# Patient Record
Sex: Female | Born: 1965 | Hispanic: No | Smoking: Current every day smoker
Health system: Southern US, Community
[De-identification: ages and names within clinical notes are randomized; demographics above are authoritative.]

## PROBLEM LIST (undated history)

## (undated) DIAGNOSIS — F319 Bipolar disorder, unspecified: Secondary | ICD-10-CM

## (undated) DIAGNOSIS — R061 Stridor: Secondary | ICD-10-CM

## (undated) DIAGNOSIS — H9190 Unspecified hearing loss, unspecified ear: Secondary | ICD-10-CM

## (undated) DIAGNOSIS — F1120 Opioid dependence, uncomplicated: Secondary | ICD-10-CM

## (undated) DIAGNOSIS — R600 Localized edema: Secondary | ICD-10-CM

## (undated) DIAGNOSIS — R7303 Prediabetes: Secondary | ICD-10-CM

## (undated) DIAGNOSIS — E0590 Thyrotoxicosis, unspecified without thyrotoxic crisis or storm: Secondary | ICD-10-CM

## (undated) DIAGNOSIS — R739 Hyperglycemia, unspecified: Secondary | ICD-10-CM

## (undated) DIAGNOSIS — R7989 Other specified abnormal findings of blood chemistry: Secondary | ICD-10-CM

## (undated) DIAGNOSIS — D649 Anemia, unspecified: Secondary | ICD-10-CM

## (undated) DIAGNOSIS — J449 Chronic obstructive pulmonary disease, unspecified: Secondary | ICD-10-CM

## (undated) DIAGNOSIS — F17200 Nicotine dependence, unspecified, uncomplicated: Secondary | ICD-10-CM

## (undated) DIAGNOSIS — F32A Depression, unspecified: Secondary | ICD-10-CM

## (undated) DIAGNOSIS — F329 Major depressive disorder, single episode, unspecified: Secondary | ICD-10-CM

## (undated) DIAGNOSIS — F419 Anxiety disorder, unspecified: Secondary | ICD-10-CM

## (undated) DIAGNOSIS — F4310 Post-traumatic stress disorder, unspecified: Secondary | ICD-10-CM

## (undated) DIAGNOSIS — R1900 Intra-abdominal and pelvic swelling, mass and lump, unspecified site: Secondary | ICD-10-CM

## (undated) HISTORY — DX: Other specified abnormal findings of blood chemistry: R79.89

## (undated) HISTORY — DX: Localized edema: R60.0

## (undated) HISTORY — DX: Anemia, unspecified: D64.9

## (undated) HISTORY — PX: THYROIDECTOMY: SHX17

## (undated) HISTORY — DX: Nicotine dependence, unspecified, uncomplicated: F17.200

## (undated) HISTORY — DX: Intra-abdominal and pelvic swelling, mass and lump, unspecified site: R19.00

## (undated) HISTORY — DX: Hyperglycemia, unspecified: R73.9

## (undated) HISTORY — DX: Opioid dependence, uncomplicated: F11.20

---
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
## 2015-11-03 ENCOUNTER — Encounter (HOSPITAL_COMMUNITY): Payer: Self-pay | Admitting: Emergency Medicine

## 2015-11-03 DIAGNOSIS — Z760 Encounter for issue of repeat prescription: Secondary | ICD-10-CM | POA: Insufficient documentation

## 2015-11-03 DIAGNOSIS — F17200 Nicotine dependence, unspecified, uncomplicated: Secondary | ICD-10-CM | POA: Insufficient documentation

## 2015-11-03 DIAGNOSIS — Z5181 Encounter for therapeutic drug level monitoring: Secondary | ICD-10-CM | POA: Insufficient documentation

## 2015-11-03 LAB — CBC
HEMATOCRIT: 33.2 % — AB (ref 36.0–46.0)
HEMOGLOBIN: 10.6 g/dL — AB (ref 12.0–15.0)
MCH: 25.1 pg — AB (ref 26.0–34.0)
MCHC: 31.9 g/dL (ref 30.0–36.0)
MCV: 78.7 fL (ref 78.0–100.0)
PLATELETS: 273 10*3/uL (ref 150–400)
RBC: 4.22 MIL/uL (ref 3.87–5.11)
RDW: 14.5 % (ref 11.5–15.5)
WBC: 7 10*3/uL (ref 4.0–10.5)

## 2015-11-03 LAB — COMPREHENSIVE METABOLIC PANEL
ALBUMIN: 3.8 g/dL (ref 3.5–5.0)
ALK PHOS: 67 U/L (ref 38–126)
ALT: 24 U/L (ref 14–54)
ANION GAP: 3 — AB (ref 5–15)
AST: 25 U/L (ref 15–41)
BUN: 14 mg/dL (ref 6–20)
CALCIUM: 9.2 mg/dL (ref 8.9–10.3)
CHLORIDE: 110 mmol/L (ref 101–111)
CO2: 24 mmol/L (ref 22–32)
Creatinine, Ser: 0.93 mg/dL (ref 0.44–1.00)
GFR calc Af Amer: >60 mL/min (ref >60)
GFR calc non Af Amer: >60 mL/min (ref >60)
GLUCOSE: 115 mg/dL — AB (ref 65–99)
Potassium: 3.5 mmol/L (ref 3.5–5.1)
SODIUM: 137 mmol/L (ref 135–145)
Total Bilirubin: 0.3 mg/dL (ref 0.3–1.2)
Total Protein: 8.6 g/dL — ABNORMAL HIGH (ref 6.5–8.1)

## 2015-11-03 LAB — ETHANOL: Alcohol, Ethyl (B): <5 mg/dL (ref <5)

## 2015-11-03 LAB — RAPID URINE DRUG SCREEN, HOSP PERFORMED
AMPHETAMINES: NOT DETECTED
BARBITURATES: NOT DETECTED
BENZODIAZEPINES: POSITIVE — AB
Cocaine: NOT DETECTED
Opiates: NOT DETECTED
TETRAHYDROCANNABINOL: NOT DETECTED

## 2015-11-03 NOTE — ED Triage Notes (Signed)
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

## 2015-11-04 ENCOUNTER — Emergency Department (HOSPITAL_COMMUNITY)
Admission: EM | Admit: 2015-11-04 | Discharge: 2015-11-04 | Disposition: A | Discharge status: Home / Self Care | Payer: Self-pay | Attending: Emergency Medicine | Admitting: Emergency Medicine

## 2015-11-04 DIAGNOSIS — Z760 Encounter for issue of repeat prescription: Secondary | ICD-10-CM

## 2015-11-04 HISTORY — DX: Depression, unspecified: F32.A

## 2015-11-04 HISTORY — DX: Unspecified hearing loss, unspecified ear: H91.90

## 2015-11-04 HISTORY — DX: Anxiety disorder, unspecified: F41.9

## 2015-11-04 HISTORY — DX: Major depressive disorder, single episode, unspecified: F32.9

## 2015-11-04 MED ORDER — CHLORDIAZEPOXIDE HCL 25 MG PO CAPS: 25.0000 mg | ORAL_CAPSULE | Freq: Three times a day (TID) | ORAL | 0 refills | Status: DC | PRN

## 2015-11-04 MED ORDER — CHLORDIAZEPOXIDE HCL 5 MG PO CAPS
50.0000 mg | ORAL_CAPSULE | Freq: Once | ORAL | Status: AC
  Administered 2015-11-04: 50 mg via ORAL
  Filled 2015-11-04: qty 10

## 2015-11-04 NOTE — ED Notes (Signed)
Patient verbalized understanding of discharge instructions and denies any further needs or questions at this time. VS stable. Patient ambulatory with steady gait.  

## 2015-11-04 NOTE — ED Notes (Signed)
MD at bedside. 

## 2015-11-04 NOTE — ED Provider Notes (Signed)
[facility] DEPT Provider Note   CSN: [id number] Arrival date & time: 11/03/15  2302  By signing my name below, I, Reola Mosher, attest that this documentation has been prepared under the direction and in the presence of Everlene Balls, MD. Electronically Signed: Reola Mosher, ED Scribe. 11/04/15. 2:27 AM.  History   Chief Complaint Chief Complaint  Patient presents with  . Addiction Problem   The history is provided by the patient. No language interpreter was used.   HPI Comments: Meghan Contreras is a 50 y.o. female with a PMHx of depression and anxiety, who presents to the Emergency Department requesting a medication refill of her rx'd Klonopin. Pt reports that she is currently travelling from [place] to visit her daughter, and ran out of her medications while she was in [place]. Her last intake was ~3 days ago. She states that she takes these medications for her hx of MDD and PTSD. Pt reports that since she has been out of her medications that she has had intermittent episodes of anxiousness, nausea, emesis, diarrhea, and chills.  Pt reports that she will see her doctor soon, and her prescription is scheduled to be refilled in ~6 days. Denies SI, auditory/visual hallucinations, fever, cough, rhinorrhea, or any other associated symptoms.   Past Medical History:  Diagnosis Date  . Anxiety   . Depression   . HOH (hard of hearing)   . Thyroid disease    There are no active problems to display for this patient.  No past surgical history on file.  OB History    No data available     Home Medications    Prior to Admission medications   Not on File   Family History No family history on file.  Social History Social History  Substance Use Topics  . Smoking status: Current Some Day Smoker  . Smokeless tobacco: Never Used  . Alcohol use No   Allergies   Amoxicillin  Review of Systems Review of Systems A complete 10 system review of systems was obtained and all systems  are negative except as noted in the HPI and PMH.   Physical Exam Updated Vital Signs BP 103/72   Pulse 80   Temp 98.5 F (36.9 C) (Oral)   Resp 24   Ht 5\' 1"  (1.549 m)   Wt 189 lb (85.7 kg)   SpO2 98%   BMI 35.71 kg/m   Physical Exam  Constitutional: She is oriented to person, place, and time. She appears well-developed and well-nourished. No distress.  HENT:  Head: Normocephalic and atraumatic.  Nose: Nose normal.  Mouth/Throat: Oropharynx is clear and moist. No oropharyngeal exudate.  Eyes: Conjunctivae and EOM are normal. Pupils are equal, round, and reactive to light. No scleral icterus.  Neck: Normal range of motion. Neck supple. No JVD present. No tracheal deviation present. No thyromegaly present.  Cardiovascular: Normal rate, regular rhythm and normal heart sounds.  Exam reveals no gallop and no friction rub.   No murmur heard. Pulmonary/Chest: Effort normal and breath sounds normal. No respiratory distress. She has no wheezes. She exhibits no tenderness.  Abdominal: Soft. Bowel sounds are normal. She exhibits no distension and no mass. There is no tenderness. There is no rebound and no guarding.  Musculoskeletal: Normal range of motion. She exhibits no edema or tenderness.  Lymphadenopathy:    She has no cervical adenopathy.  Neurological: She is alert and oriented to person, place, and time. No cranial nerve deficit. She exhibits normal muscle tone.  Skin: Skin is warm and dry. No rash noted. No erythema. No pallor.  Nursing note and vitals reviewed.  ED Treatments / Results  DIAGNOSTIC STUDIES: Oxygen Saturation is 99% on RA, normal by my interpretation.  COORDINATION OF CARE: 2:15 AM-Discussed next steps with pt. Pt verbalized understanding and is agreeable with the plan.   Labs (all labs ordered are listed, but only abnormal results are displayed) Labs Reviewed  COMPREHENSIVE METABOLIC PANEL - Abnormal; Notable for the following:       Result Value    Glucose, Bld 115 (*)    Total Protein 8.6 (*)    Anion gap 3 (*)    All other components within normal limits  CBC - Abnormal; Notable for the following:    Hemoglobin 10.6 (*)    HCT 33.2 (*)    MCH 25.1 (*)    All other components within normal limits  URINE RAPID DRUG SCREEN, HOSP PERFORMED - Abnormal; Notable for the following:    Benzodiazepines POSITIVE (*)    All other components within normal limits  ETHANOL   EKG  EKG Interpretation None      Radiology No results found.  Procedures Procedures (including critical care time)  Medications Ordered in ED Medications - No data to display  Initial Impression / Assessment and Plan / ED Course  I have reviewed the triage vital signs and the nursing notes.  Pertinent labs & imaging results that were available during my care of the patient were reviewed by me and considered in my medical decision making (see chart for details).  Clinical Course   Patient presents to the ED for benzo Withdrawals and.  She has run out of her medications and is requesting Klonopin.  Will give librium in the ED and provide Rx for 10 more tabs.  PCP fu advised immediately when she gets home.  She has no current withdrawal symptoms in the ED.  VS are normal.  There are no tongue fasiculations.  She appears well and in NAD.  VS remain within her normal limits and she is safe for DC.  Final Clinical Impressions(s) / ED Diagnoses   Final diagnoses:  None   New Prescriptions New Prescriptions   No medications on file      I personally performed the services described in this documentation, which was scribed in my presence. The recorded information has been reviewed and is accurate.      Everlene Balls, MD 11/04/15 [phone]

## 2017-03-01 DIAGNOSIS — R061 Stridor: Secondary | ICD-10-CM

## 2017-03-01 HISTORY — PX: THYROIDECTOMY: SHX17

## 2017-03-01 HISTORY — DX: Stridor: R06.1

## 2017-10-11 ENCOUNTER — Encounter (HOSPITAL_COMMUNITY): Payer: Self-pay | Admitting: Emergency Medicine

## 2017-10-11 ENCOUNTER — Emergency Department (HOSPITAL_COMMUNITY): Payer: Medicaid Other

## 2017-10-11 ENCOUNTER — Other Ambulatory Visit: Payer: Self-pay

## 2017-10-11 ENCOUNTER — Emergency Department (HOSPITAL_COMMUNITY)
Admission: EM | Admit: 2017-10-11 | Discharge: 2017-10-12 | Disposition: A | Discharge status: Home / Self Care | Payer: Medicaid Other | Attending: Emergency Medicine | Admitting: Emergency Medicine

## 2017-10-11 DIAGNOSIS — E049 Nontoxic goiter, unspecified: Secondary | ICD-10-CM | POA: Insufficient documentation

## 2017-10-11 DIAGNOSIS — M549 Dorsalgia, unspecified: Secondary | ICD-10-CM | POA: Diagnosis present

## 2017-10-11 DIAGNOSIS — R1310 Dysphagia, unspecified: Secondary | ICD-10-CM | POA: Diagnosis not present

## 2017-10-11 DIAGNOSIS — Z79899 Other long term (current) drug therapy: Secondary | ICD-10-CM | POA: Insufficient documentation

## 2017-10-11 DIAGNOSIS — M62838 Other muscle spasm: Secondary | ICD-10-CM

## 2017-10-11 DIAGNOSIS — F17200 Nicotine dependence, unspecified, uncomplicated: Secondary | ICD-10-CM | POA: Diagnosis not present

## 2017-10-11 HISTORY — DX: Thyrotoxicosis, unspecified without thyrotoxic crisis or storm: E05.90

## 2017-10-11 HISTORY — DX: Post-traumatic stress disorder, unspecified: F43.10

## 2017-10-11 LAB — I-STAT BETA HCG BLOOD, ED (MC, WL, AP ONLY): I-stat hCG, quantitative: <5 m[IU]/mL (ref <5)

## 2017-10-11 LAB — CBC
HEMATOCRIT: 32.7 % — AB (ref 36.0–46.0)
Hemoglobin: 10.6 g/dL — ABNORMAL LOW (ref 12.0–15.0)
MCH: 26.5 pg (ref 26.0–34.0)
MCHC: 32.4 g/dL (ref 30.0–36.0)
MCV: 81.8 fL (ref 78.0–100.0)
PLATELETS: 314 10*3/uL (ref 150–400)
RBC: 4 MIL/uL (ref 3.87–5.11)
RDW: 13.8 % (ref 11.5–15.5)
WBC: 7.8 10*3/uL (ref 4.0–10.5)

## 2017-10-11 LAB — I-STAT TROPONIN, ED: Troponin i, poc: 0 ng/mL (ref 0.00–0.08)

## 2017-10-11 NOTE — ED Triage Notes (Signed)
Pt states she has been "sick" X1 week. Pt reports nasal congestion, sinus pressure, sore throat, and R neck swelling. Pt states hx of hyperthyroidism and she typical has swelling to R side of her neck r/t goiter but swelling has worsened over the past few days. Pt also states she has been off her hormone pills X3 weeks, also says she is a recovering addict and takes suboxone but actually took Morphine today for her pain.

## 2017-10-12 LAB — I-STAT CHEM 8, ED
BUN: 18 mg/dL (ref 6–20)
CALCIUM ION: 1.15 mmol/L (ref 1.15–1.40)
CHLORIDE: 108 mmol/L (ref 98–111)
CREATININE: 0.9 mg/dL (ref 0.44–1.00)
GLUCOSE: 110 mg/dL — AB (ref 70–99)
HCT: 32 % — ABNORMAL LOW (ref 36.0–46.0)
Hemoglobin: 10.9 g/dL — ABNORMAL LOW (ref 12.0–15.0)
POTASSIUM: 3.5 mmol/L (ref 3.5–5.1)
Sodium: 142 mmol/L (ref 135–145)
TCO2: 24 mmol/L (ref 22–32)

## 2017-10-12 LAB — I-STAT TROPONIN, ED: Troponin i, poc: 0 ng/mL (ref 0.00–0.08)

## 2017-10-12 NOTE — ED Provider Notes (Signed)
[facility] EMERGENCY DEPARTMENT Provider Note  CSN: [id number] Arrival date & time: 10/11/17 2039  Chief Complaint(s) Multiple Complaints  HPI Meghan Contreras is a 52 y.o. female who presents to the emergency department with multiple complaints including right mid back and neck pain, goiter and odynophagia.  Patient reports that she is in the process of moving down to [place] from [place].  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  Reported that her back pain and neck pain began several days ago.  Pain is aching in nature.  Exacerbated with movement and palpation.  No alleviating factors.  She has tried taking Motrin without relief.  Patient also reports being a recovering addict on Suboxone however reports that she took morphine today which was not hers causing her to feel even worse.  She is also endorsing several days of odynophagia related to her goiter.  She reports that she stopped taking her "hormone" medication 3 weeks ago.  States that the dentofacial goes from her throat down into the upper chest.  This is only felt with eating/drinking hot foods and liquids.  She denies any associated shortness of breath.  No substernal precordial chest pain.  No fevers or chills.  No nausea or vomiting.  No abdominal pain.  HPI  Past Medical History Past Medical History:  Diagnosis Date  . Anxiety   . Depression   . HOH (hard of hearing)   . Hyperthyroidism   . PTSD (post-traumatic stress disorder)    There are no active problems to display for this patient.  Home Medication(s) Prior to Admission medications   Medication Sig Start Date End Date Taking? Authorizing Provider  buprenorphine-naloxone (SUBOXONE) 8-2 mg SUBL SL tablet Place 1 tablet under the tongue 2 (two) times daily.   Yes [provider]  estradiol (ESTRACE) 2 MG tablet Take 2 mg by mouth daily.   Yes [provider]  ibuprofen (ADVIL,MOTRIN) 600 MG tablet Take 600  mg by mouth once.   Yes [provider]  morphine (MS CONTIN) 30 MG 12 hr tablet Take 30 mg by mouth once.   Yes [provider]  prazosin (MINIPRESS) 2 MG capsule Take 4 mg by mouth at bedtime.   Yes [provider]  QUEtiapine (SEROQUEL) 100 MG tablet Take 100 mg by mouth daily.   Yes [provider]  QUEtiapine (SEROQUEL) 300 MG tablet Take 300 mg by mouth at bedtime.   Yes [provider]  chlordiazePOXIDE (LIBRIUM) 25 MG capsule Take 1 capsule (25 mg total) by mouth 3 (three) times daily as needed for withdrawal. 11/04/15   Everlene Balls, MD                                                                                                                                    Past Surgical History History reviewed. No pertinent surgical history. Family History No family  history on file.  Social History Social History   Tobacco Use  . Smoking status: Current Some Day Smoker  . Smokeless tobacco: Never Used  Substance Use Topics  . Alcohol use: No  . Drug use: Yes    Comment: Benzodiazepines / Klonopin addiction    Allergies Amoxicillin  Review of Systems Review of Systems All other systems are reviewed and are negative for acute change except as noted in the HPI  Physical Exam Vital Signs  I have reviewed the triage vital signs BP 110/75 (BP Location: Left Arm)   Pulse 61   Temp 98.6 F (37 C) (Oral)   Resp 16   Ht 5\' 3"  (1.6 m)   Wt 68 kg   SpO2 97%   BMI 26.57 kg/m    Physical Exam  Constitutional: She is oriented to person, place, and time. She appears well-developed and well-nourished. No distress.  HENT:  Head: Normocephalic and atraumatic.  Right Ear: Tympanic membrane normal.  Left Ear: Tympanic membrane normal.  Nose: Nose normal.  Mouth/Throat: No oropharyngeal exudate, posterior oropharyngeal edema or posterior oropharyngeal erythema. No tonsillar exudate.  Eyes: Pupils are equal, round, and reactive to light.  Conjunctivae and EOM are normal. Right eye exhibits no discharge. Left eye exhibits no discharge. No scleral icterus.  Neck: Normal range of motion and phonation normal. Neck supple. Muscular tenderness present. Thyromegaly (bilaterally, R>L) present.    Cardiovascular: Normal rate and regular rhythm. Exam reveals no gallop and no friction rub.  No murmur heard. Pulmonary/Chest: Effort normal and breath sounds normal. No stridor. No respiratory distress. She has no rales.  Abdominal: Soft. She exhibits no distension. There is no tenderness.  Musculoskeletal: She exhibits no edema.       Cervical back: She exhibits tenderness and spasm. She exhibits no bony tenderness.       Back:  Neurological: She is alert and oriented to person, place, and time.  Skin: Skin is warm and dry. No rash noted. She is not diaphoretic. No erythema.  Psychiatric: She has a normal mood and affect.  Vitals reviewed.   ED Results and Treatments Labs (all labs ordered are listed, but only abnormal results are displayed) Labs Reviewed  CBC - Abnormal; Notable for the following components:      Result Value   Hemoglobin 10.6 (*)    HCT 32.7 (*)    All other components within normal limits  I-STAT CHEM 8, ED - Abnormal; Notable for the following components:   Glucose, Bld 110 (*)    Hemoglobin 10.9 (*)    HCT 32.0 (*)    All other components within normal limits  I-STAT TROPONIN, ED  I-STAT BETA HCG BLOOD, ED ([facility], [facility], [facility] ONLY)  I-STAT TROPONIN, ED                                                                                                                         EKG  EKG Interpretation  Date/Time:  Tuesday  October 11 2017 20:47:53 EDT Ventricular Rate:  73 PR Interval:  164 QRS Duration: 92 QT Interval:  348 QTC Calculation: 383 R Axis:   61 Text Interpretation:  Normal sinus rhythm Nonspecific T wave abnormality Abnormal ECG NO STEMI No old tracing to compare Confirmed by Addison Lank [phone]) on  10/12/2017 1:27:09 AM Also confirmed by Addison Lank [phone]), editor Philomena Doheny [phone])  on 10/12/2017 8:22:30 AM      Radiology Dg Chest 2 View  Result Date: 10/11/2017 CLINICAL DATA:  Shortness of breath EXAM: CHEST - 2 VIEW COMPARISON:  None. FINDINGS: There is no edema or consolidation. The heart size and pulmonary vascularity are normal. No adenopathy. There is degenerative change in the thoracic spine. IMPRESSION: No edema or consolidation. Electronically Signed   By: Lowella Grip III M.D.   On: 10/11/2017 21:46   Pertinent labs & imaging results that were available during my care of the patient were reviewed by me and considered in my medical decision making (see chart for details).  Medications Ordered in ED Medications - No data to display                                                                                                                                  Procedures Procedures  (including critical care time)  Medical Decision Making / ED Course I have reviewed the nursing notes for this encounter and the patient's prior records (if available in EHR or on provided paperwork).    1. Back/neck pain Patient with spastic musculature.  This appears to be the source of her back and neck pain.  2. Goiter with odynophagia Due to her reported chest pain, triage obtain cardiac work-up which revealed nonischemic EKG.  Troponin negative x2. Chest x-ray without evidence suggestive of pneumonia, pneumothorax, pneumomediastinum.  No abnormal contour of the mediastinum to suggest dissection. No evidence of acute injuries. Her chest pain appears to be GI related. There is no respiratory distress and patient is able to tolerate oral hydration.  She reports that she has PCP follow-up within 1 week.  The patient appears reasonably screened and/or stabilized for discharge and I doubt any other medical condition or other [facility] requiring further screening, evaluation, or treatment  in the ED at this time prior to discharge.  The patient is safe for discharge with strict return precautions.  Final Clinical Impression(s) / ED Diagnoses Final diagnoses:  Trapezius muscle spasm  Goiter  Odynophagia    Disposition: Discharge  Condition: Good  I have discussed the results, Dx and Tx plan with the patient who expressed understanding and agree(s) with the plan. Discharge instructions discussed at great length. The patient was given strict return precautions who verbalized understanding of the instructions. No further questions at time of discharge.    ED Discharge Orders    None       Follow Up: Primary care provider   If you do not  have a primary care physician, contact [facility] at [phone] for referral     This chart was dictated using voice recognition software.  Despite best efforts to proofread,  errors can occur which can change the documentation meaning.   Fatima Blank, MD 10/12/17 [phone]

## 2017-10-12 NOTE — Discharge Instructions (Addendum)
You may use over-the-counter Motrin (Ibuprofen), Acetaminophen (Tylenol), topical muscle creams such as SalonPas, Icy Hot, Bengay, etc. Please stretch, apply heat, and have massage therapy for additional assistance. ° °

## 2018-08-04 ENCOUNTER — Emergency Department (HOSPITAL_COMMUNITY): Payer: 59

## 2018-08-04 ENCOUNTER — Other Ambulatory Visit: Payer: Self-pay

## 2018-08-04 ENCOUNTER — Inpatient Hospital Stay (HOSPITAL_COMMUNITY)
Admission: EM | Admit: 2018-08-04 | Discharge: 2018-08-05 | DRG: 189 | Discharge status: Against Medical Advice | Payer: 59 | Attending: Internal Medicine | Admitting: Internal Medicine

## 2018-08-04 ENCOUNTER — Encounter (HOSPITAL_COMMUNITY): Payer: Self-pay | Admitting: Emergency Medicine

## 2018-08-04 DIAGNOSIS — E0590 Thyrotoxicosis, unspecified without thyrotoxic crisis or storm: Secondary | ICD-10-CM | POA: Diagnosis present

## 2018-08-04 DIAGNOSIS — G8929 Other chronic pain: Secondary | ICD-10-CM | POA: Diagnosis present

## 2018-08-04 DIAGNOSIS — R0602 Shortness of breath: Secondary | ICD-10-CM

## 2018-08-04 DIAGNOSIS — Z5329 Procedure and treatment not carried out because of patient's decision for other reasons: Secondary | ICD-10-CM | POA: Diagnosis not present

## 2018-08-04 DIAGNOSIS — Z20828 Contact with and (suspected) exposure to other viral communicable diseases: Secondary | ICD-10-CM | POA: Diagnosis present

## 2018-08-04 DIAGNOSIS — F4310 Post-traumatic stress disorder, unspecified: Secondary | ICD-10-CM | POA: Diagnosis present

## 2018-08-04 DIAGNOSIS — J9621 Acute and chronic respiratory failure with hypoxia: Principal | ICD-10-CM | POA: Diagnosis present

## 2018-08-04 DIAGNOSIS — E890 Postprocedural hypothyroidism: Secondary | ICD-10-CM | POA: Diagnosis present

## 2018-08-04 DIAGNOSIS — J9622 Acute and chronic respiratory failure with hypercapnia: Secondary | ICD-10-CM | POA: Diagnosis present

## 2018-08-04 DIAGNOSIS — J9811 Atelectasis: Secondary | ICD-10-CM | POA: Diagnosis present

## 2018-08-04 DIAGNOSIS — J441 Chronic obstructive pulmonary disease with (acute) exacerbation: Secondary | ICD-10-CM | POA: Diagnosis present

## 2018-08-04 DIAGNOSIS — F418 Other specified anxiety disorders: Secondary | ICD-10-CM | POA: Diagnosis present

## 2018-08-04 DIAGNOSIS — G9341 Metabolic encephalopathy: Secondary | ICD-10-CM | POA: Diagnosis present

## 2018-08-04 DIAGNOSIS — F17210 Nicotine dependence, cigarettes, uncomplicated: Secondary | ICD-10-CM | POA: Diagnosis present

## 2018-08-04 LAB — CBC WITH DIFFERENTIAL/PLATELET
Abs Immature Granulocytes: 0.01 10*3/uL (ref 0.00–0.07)
Basophils Absolute: 0.1 10*3/uL (ref 0.0–0.1)
Basophils Relative: 1 %
Eosinophils Absolute: 0 10*3/uL (ref 0.0–0.5)
Eosinophils Relative: 1 %
HCT: 34 % — ABNORMAL LOW (ref 36.0–46.0)
Hemoglobin: 10.9 g/dL — ABNORMAL LOW (ref 12.0–15.0)
Immature Granulocytes: 0 %
Lymphocytes Relative: 47 %
Lymphs Abs: 2.3 10*3/uL (ref 0.7–4.0)
MCH: 28.3 pg (ref 26.0–34.0)
MCHC: 32.1 g/dL (ref 30.0–36.0)
MCV: 88.3 fL (ref 80.0–100.0)
Monocytes Absolute: 0.2 10*3/uL (ref 0.1–1.0)
Monocytes Relative: 5 %
Neutro Abs: 2.3 10*3/uL (ref 1.7–7.7)
Neutrophils Relative %: 46 %
Platelets: 293 10*3/uL (ref 150–400)
RBC: 3.85 MIL/uL — ABNORMAL LOW (ref 3.87–5.11)
RDW: 17.2 % — ABNORMAL HIGH (ref 11.5–15.5)
WBC: 4.9 10*3/uL (ref 4.0–10.5)
nRBC: 0 % (ref 0.0–0.2)

## 2018-08-04 LAB — POCT I-STAT EG7
Acid-Base Excess: 8 mmol/L — ABNORMAL HIGH (ref 0.0–2.0)
Bicarbonate: 35.1 mmol/L — ABNORMAL HIGH (ref 20.0–28.0)
Calcium, Ion: 1.14 mmol/L — ABNORMAL LOW (ref 1.15–1.40)
HCT: 33 % — ABNORMAL LOW (ref 36.0–46.0)
Hemoglobin: 11.2 g/dL — ABNORMAL LOW (ref 12.0–15.0)
O2 Saturation: 80 %
Potassium: 3.7 mmol/L (ref 3.5–5.1)
Sodium: 143 mmol/L (ref 135–145)
TCO2: 37 mmol/L — ABNORMAL HIGH (ref 22–32)
pCO2, Ven: 61 mmHg — ABNORMAL HIGH (ref 44.0–60.0)
pH, Ven: 7.368 (ref 7.250–7.430)
pO2, Ven: 48 mmHg — ABNORMAL HIGH (ref 32.0–45.0)

## 2018-08-04 LAB — POCT I-STAT 7, (LYTES, BLD GAS, ICA,H+H)
Acid-Base Excess: 5 mmol/L — ABNORMAL HIGH (ref 0.0–2.0)
Bicarbonate: 34.5 mmol/L — ABNORMAL HIGH (ref 20.0–28.0)
Calcium, Ion: 1.29 mmol/L (ref 1.15–1.40)
HCT: 32 % — ABNORMAL LOW (ref 36.0–46.0)
Hemoglobin: 10.9 g/dL — ABNORMAL LOW (ref 12.0–15.0)
O2 Saturation: 93 %
Patient temperature: 98.4
Potassium: 3.8 mmol/L (ref 3.5–5.1)
Sodium: 140 mmol/L (ref 135–145)
TCO2: 37 mmol/L — ABNORMAL HIGH (ref 22–32)
pCO2 arterial: 76.3 mmHg (ref 32.0–48.0)
pH, Arterial: 7.263 — ABNORMAL LOW (ref 7.350–7.450)
pO2, Arterial: 78 mmHg — ABNORMAL LOW (ref 83.0–108.0)

## 2018-08-04 LAB — BASIC METABOLIC PANEL
Anion gap: 4 — ABNORMAL LOW (ref 5–15)
BUN: 20 mg/dL (ref 6–20)
CO2: 36 mmol/L — ABNORMAL HIGH (ref 22–32)
Calcium: 9.4 mg/dL (ref 8.9–10.3)
Chloride: 101 mmol/L (ref 98–111)
Creatinine, Ser: 1.39 mg/dL — ABNORMAL HIGH (ref 0.44–1.00)
GFR calc Af Amer: 51 mL/min — ABNORMAL LOW (ref >60)
GFR calc non Af Amer: 44 mL/min — ABNORMAL LOW (ref >60)
Glucose, Bld: 95 mg/dL (ref 70–99)
Potassium: 3.7 mmol/L (ref 3.5–5.1)
Sodium: 141 mmol/L (ref 135–145)

## 2018-08-04 LAB — I-STAT BETA HCG BLOOD, ED (MC, WL, AP ONLY): I-stat hCG, quantitative: <5 m[IU]/mL (ref <5)

## 2018-08-04 LAB — TROPONIN I: Troponin I: <0.03 ng/mL (ref <0.03)

## 2018-08-04 LAB — RAPID URINE DRUG SCREEN, HOSP PERFORMED
Amphetamines: NOT DETECTED
Barbiturates: NOT DETECTED
Benzodiazepines: POSITIVE — AB
Cocaine: NOT DETECTED
Opiates: NOT DETECTED
Tetrahydrocannabinol: NOT DETECTED

## 2018-08-04 LAB — MAGNESIUM: Magnesium: 2.1 mg/dL (ref 1.7–2.4)

## 2018-08-04 LAB — BRAIN NATRIURETIC PEPTIDE: B Natriuretic Peptide: 15.4 pg/mL (ref 0.0–100.0)

## 2018-08-04 LAB — SARS CORONAVIRUS 2 BY RT PCR (HOSPITAL ORDER, PERFORMED IN ~~LOC~~ HOSPITAL LAB): SARS Coronavirus 2: NEGATIVE

## 2018-08-04 MED ORDER — ALBUTEROL SULFATE (2.5 MG/3ML) 0.083% IN NEBU
2.5000 mg | INHALATION_SOLUTION | Freq: Once | RESPIRATORY_TRACT | Status: AC
  Administered 2018-08-04: 21:00:00 2.5 mg via RESPIRATORY_TRACT
  Filled 2018-08-04: qty 3

## 2018-08-04 MED ORDER — IOHEXOL 300 MG/ML  SOLN
75.0000 mL | Freq: Once | INTRAMUSCULAR | Status: AC | PRN
  Administered 2018-08-04: 21:00:00 75 mL via INTRAVENOUS

## 2018-08-04 MED ORDER — ALBUTEROL SULFATE HFA 108 (90 BASE) MCG/ACT IN AERS
2.0000 | INHALATION_SPRAY | Freq: Once | RESPIRATORY_TRACT | Status: AC
  Administered 2018-08-04: 2 via RESPIRATORY_TRACT
  Filled 2018-08-04: qty 6.7

## 2018-08-04 MED ORDER — ALBUTEROL SULFATE HFA 108 (90 BASE) MCG/ACT IN AERS
4.0000 | INHALATION_SPRAY | Freq: Once | RESPIRATORY_TRACT | Status: DC
  Filled 2018-08-04: qty 6.7

## 2018-08-04 MED ORDER — METHYLPREDNISOLONE SODIUM SUCC 125 MG IJ SOLR
125.0000 mg | Freq: Once | INTRAMUSCULAR | Status: AC
  Administered 2018-08-04: 18:00:00 125 mg via INTRAVENOUS
  Filled 2018-08-04: qty 2

## 2018-08-04 NOTE — ED Notes (Signed)
Resident MD at pt bedside.

## 2018-08-04 NOTE — ED Notes (Signed)
Pt repositioned to upright position.  Sats increased to 96 % and O2 was decreased from 8L to 4L [place].   Attempted to decrease O2 on [place] further to 2L, but sats dropped to 86%.

## 2018-08-04 NOTE — H&P (Signed)
History and Physical   Meghan Contreras GDJ:[id number] DOB: [birth year] DOA: 08/04/2018  Referring MD/NP/PA: Dr. Lajean Saver  PCP: Patient, No Pcp Per   Outpatient Specialists: None  Patient coming from: Home  Chief Complaint: Altered mental status  HPI: Meghan Contreras is a 53 y.o. female with medical history significant of posttraumatic stress disorder, hyperthyroidism status post thyroidectomy, anxiety disorder with depression who was brought in to the ER with acute respiratory failure altered mental status currently on BiPAP.  She has no prior documented history of COPD but patient notably having significant hypercarbia and hypoxemia consistent with COPD exacerbation.  Patient currently on BiPAP and not responding.  PCO2 is 70.  She is arousable but does not stay awake too long.  She is not moving much air bilaterally and presumed to have COPD exacerbation.  Patient is being admitted for acute on chronic respiratory failure secondary to hypercarbia..  ED Course: Temperature is 98.4 with blood pressure 96/76 pulse 83 respiratory 25 oxygen sat 83% on room air currently 100% on BiPAP.  White count 4.9 hemoglobin 10.9 and platelet count of 293.  CO2 of 36 creatinine 1.38.  Calcium is 9.4.  Urine drug screen is positive for the benzos.  ABG showed a pH of 7.263 PCO2 76.3 and PO2 of 78.  CT neck soft tissue showed no neck masses.  Chest x-ray showed bibasilar atelectasis.  COVID-19 testing is negative.  Patient is being admitted with acute respiratory failure secondary to COPD exacerbation  Review of Systems: As per HPI otherwise 10 point review of systems negative.    Past Medical History:  Diagnosis Date  . Anxiety   . Depression   . HOH (hard of hearing)   . Hyperthyroidism   . PTSD (post-traumatic stress disorder)     Past Surgical History:  Procedure Laterality Date  . THYROIDECTOMY  2019     reports that she has been smoking. She has never used smokeless tobacco. She reports current  drug use. She reports that she does not drink alcohol.  Allergies  Allergen Reactions  . Amoxicillin Swelling    No family history on file.   Prior to Admission medications   Medication Sig Start Date End Date Taking? Authorizing Provider  buprenorphine-naloxone (SUBOXONE) 8-2 mg SUBL SL tablet Place 1 tablet under the tongue 2 (two) times daily.    [provider]  chlordiazePOXIDE (LIBRIUM) 25 MG capsule Take 1 capsule (25 mg total) by mouth 3 (three) times daily as needed for withdrawal. 11/04/15   Everlene Balls, MD  estradiol (ESTRACE) 2 MG tablet Take 2 mg by mouth daily.    [provider]  ibuprofen (ADVIL,MOTRIN) 600 MG tablet Take 600 mg by mouth once.    [provider]  morphine (MS CONTIN) 30 MG 12 hr tablet Take 30 mg by mouth once.    [provider]  prazosin (MINIPRESS) 2 MG capsule Take 4 mg by mouth at bedtime.    [provider]  QUEtiapine (SEROQUEL) 100 MG tablet Take 100 mg by mouth daily.    [provider]  QUEtiapine (SEROQUEL) 300 MG tablet Take 300 mg by mouth at bedtime.    [provider]    Physical Exam: Vitals:   08/04/18 1915 08/04/18 1945 08/04/18 2110 08/04/18 2111  BP: 129/87 123/76    Pulse: 65 76  71  Resp: 13 (!) 25  16  Temp:      TempSrc:      SpO2: 95% (!) 83% 95%  95%  Weight:      Height:          Constitutional: Sleepy not communicating Vitals:   08/04/18 1915 08/04/18 1945 08/04/18 2110 08/04/18 2111  BP: 129/87 123/76    Pulse: 65 76  71  Resp: 13 (!) 25  16  Temp:      TempSrc:      SpO2: 95% (!) 83% 95% 95%  Weight:      Height:       Eyes: PERRL, lids and conjunctivae normal ENMT: Mucous membranes are moist. Posterior pharynx clear of any exudate or lesions.Normal dentition.  Neck: normal, supple, no masses, no thyromegaly Respiratory: Decreased air entry bilaterally with marked expiratory wheezing, no significant crackles, increase respiratory drive with  use of accessory muscles  Cardiovascular: Regular rate and rhythm, no murmurs / rubs / gallops. No extremity edema. 2+ pedal pulses. No carotid bruits.  Abdomen: no tenderness, no masses palpated. No hepatosplenomegaly. Bowel sounds positive.  Musculoskeletal: no clubbing / cyanosis. No joint deformity upper and lower extremities. Good ROM, no contractures. Normal muscle tone.  Skin: no rashes, lesions, ulcers. No induration Neurologic: CN 2-12 grossly intact. Sensation intact, DTR normal. Strength 5/5 in all 4.  Psychiatric: Obtunded, responsive to voice but sleepy    Labs on Admission: I have personally reviewed following labs and imaging studies  CBC: Recent Labs  Lab 08/04/18 1711 08/04/18 1721 08/04/18 2020  WBC 4.9  --   --   NEUTROABS 2.3  --   --   HGB 10.9* 11.2* 10.9*  HCT 34.0* 33.0* 32.0*  MCV 88.3  --   --   PLT 293  --   --    Basic Metabolic Panel: Recent Labs  Lab 08/04/18 1711 08/04/18 1721 08/04/18 2020  NA 141 143 140  K 3.7 3.7 3.8  CL 101  --   --   CO2 36*  --   --   GLUCOSE 95  --   --   BUN 20  --   --   CREATININE 1.39*  --   --   CALCIUM 9.4  --   --   MG 2.1  --   --    GFR: Estimated Creatinine Clearance: 45.8 mL/min (A) (by C-G formula based on SCr of 1.39 mg/dL (H)). Liver Function Tests: No results for input(s): AST, ALT, ALKPHOS, BILITOT, PROT, ALBUMIN in the last 168 hours. No results for input(s): LIPASE, AMYLASE in the last 168 hours. No results for input(s): AMMONIA in the last 168 hours. Coagulation Profile: No results for input(s): INR, PROTIME in the last 168 hours. Cardiac Enzymes: Recent Labs  Lab 08/04/18 1711  TROPONINI <0.03   BNP (last 3 results) No results for input(s): PROBNP in the last 8760 hours. HbA1C: No results for input(s): HGBA1C in the last 72 hours. CBG: No results for input(s): GLUCAP in the last 168 hours. Lipid Profile: No results for input(s): CHOL, HDL, LDLCALC, TRIG, CHOLHDL, LDLDIRECT in the  last 72 hours. Thyroid Function Tests: No results for input(s): TSH, T4TOTAL, FREET4, T3FREE, THYROIDAB in the last 72 hours. Anemia Panel: No results for input(s): VITAMINB12, FOLATE, FERRITIN, TIBC, IRON, RETICCTPCT in the last 72 hours. Urine analysis: No results found for: COLORURINE, APPEARANCEUR, LABSPEC, PHURINE, GLUCOSEU, HGBUR, BILIRUBINUR, KETONESUR, PROTEINUR, UROBILINOGEN, NITRITE, LEUKOCYTESUR Sepsis Labs: @LABRCNTIP (procalcitonin:4,lacticidven:4) ) Recent Results (from the past 240 hour(s))  SARS Coronavirus 2 (CEPHEID - Performed in [place] hospital lab), Hosp Order     Status: None  Collection Time: 08/04/18  6:08 PM  Result Value Ref Range Status   SARS Coronavirus 2 NEGATIVE NEGATIVE Final    Comment: (NOTE) If result is NEGATIVE SARS-CoV-2 target nucleic acids are NOT DETECTED. The SARS-CoV-2 RNA is generally detectable in upper and lower  respiratory specimens during the acute phase of infection. The lowest  concentration of SARS-CoV-2 viral copies this assay can detect is 250  copies / mL. A negative result does not preclude SARS-CoV-2 infection  and should not be used as the sole basis for treatment or other  patient management decisions.  A negative result may occur with  improper specimen collection / handling, submission of specimen other  than nasopharyngeal swab, presence of viral mutation(s) within the  areas targeted by this assay, and inadequate number of viral copies  (<250 copies / mL). A negative result must be combined with clinical  observations, patient history, and epidemiological information. If result is POSITIVE SARS-CoV-2 target nucleic acids are DETECTED. The SARS-CoV-2 RNA is generally detectable in upper and lower  respiratory specimens dur ing the acute phase of infection.  Positive  results are indicative of active infection with SARS-CoV-2.  Clinical  correlation with patient history and other diagnostic information is  necessary  to determine patient infection status.  Positive results do  not rule out bacterial infection or co-infection with other viruses. If result is PRESUMPTIVE POSTIVE SARS-CoV-2 nucleic acids MAY BE PRESENT.   A presumptive positive result was obtained on the submitted specimen  and confirmed on repeat testing.  While 2019 novel coronavirus  (SARS-CoV-2) nucleic acids may be present in the submitted sample  additional confirmatory testing may be necessary for epidemiological  and / or clinical management purposes  to differentiate between  SARS-CoV-2 and other Sarbecovirus currently known to infect humans.  If clinically indicated additional testing with an alternate test  methodology [phone]) is advised. The SARS-CoV-2 RNA is generally  detectable in upper and lower respiratory sp ecimens during the acute  phase of infection. The expected result is Negative. Fact Sheet for Patients:  StrictlyIdeas.no Fact Sheet for Healthcare Providers: BankingDealers.co.za This test is not yet approved or cleared by the [place] FDA and has been authorized for detection and/or diagnosis of SARS-CoV-2 by FDA under an Emergency Use Authorization (EUA).  This EUA will remain in effect (meaning this test can be used) for the duration of the COVID-19 declaration under Section 564(b)(1) of the Act, 21 U.S.C. section 360bbb-3(b)(1), unless the authorization is terminated or revoked sooner. Performed at [facility], [place] [address] [place], [place] [address]      Radiological Exams on Admission: Ct Soft Tissue Neck W Contrast  Result Date: 08/04/2018 CLINICAL DATA:  Shortness of breath for 6 months, worsening recently. Thyroid removal. EXAM: CT NECK WITH CONTRAST TECHNIQUE: Multidetector CT imaging of the neck was performed using the standard protocol following the bolus administration of intravenous contrast. CONTRAST:  49mL OMNIPAQUE IOHEXOL 300  MG/ML  SOLN COMPARISON:  None. FINDINGS: The patient was breathing and swallowing throughout the exam. Image quality is reduced. The study is suboptimal probably diagnostic. Pharynx and larynx: Normal. No mass or swelling. No subglottic narrowing. Salivary glands: No inflammation, mass, or stone. Thyroid: Surgically absent. No hyperattenuating thyroid tissue is seen. Lymph nodes: None enlarged or abnormal density. Vascular: Patent Limited intracranial: Negative. Visualized orbits: Negative. Mastoids and visualized paranasal sinuses: Negative. Skeleton: Spondylosis.  Poor dentition. Upper chest: No visible mass or pneumothorax. Other: None. IMPRESSION: Poor quality examination  due to patient breathing and swallowing demonstrating no pharyngeal or laryngeal mass. No subglottic narrowing. No apparent complications of thyroid surgery. Electronically Signed   By: Staci Righter M.D.   On: 08/04/2018 21:09   Dg Chest Portable 1 View  Result Date: 08/04/2018 CLINICAL DATA:  Chronic shortness of breath for the past 6 months. EXAM: PORTABLE CHEST 1 VIEW COMPARISON:  Chest x-ray dated October 11, 2017. FINDINGS: The heart size and mediastinal contours are within normal limits. Normal pulmonary vascularity. Low lung volumes with bibasilar atelectasis. No pleural effusion or pneumothorax. No acute osseous abnormality. IMPRESSION: Low lung volumes with bibasilar atelectasis. Electronically Signed   By: Titus Dubin M.D.   On: 08/04/2018 18:08    EKG: Independently reviewed.  Shows normal sinus rhythm with a rate of 86, nonspecific ST changes  Assessment/Plan Principal Problem:   Acute on chronic respiratory failure with hypoxemia (HCC) Active Problems:   COPD with acute exacerbation (HCC)   Hyperthyroidism   Acute metabolic encephalopathy     #1 acute on chronic respiratory failure with hypercarbia: Patient currently on BiPAP.  Admit to progressive care.  Continue BiPAP with steroids antibiotics and nebulizer  treatment.  #2 COPD exacerbation: Patient will be maintained on steroids with nebulizer and empiric antibiotics.  Supportive care.  #3 metabolic encephalopathy: Secondary to hypercarbia.  Most likely patient's mental status will improve with treatment.  #4 anxiety depression and PTSD: Resume home regimen as soon as practicable.   DVT prophylaxis: Lovenox Code Status: Full code Family Communication: No family currently at bedside Disposition Plan: To be determined Consults called: None Admission status: Inpatient  Severity of Illness: The appropriate patient status for this patient is INPATIENT. Inpatient status is judged to be reasonable and necessary in order to provide the required intensity of service to ensure the patient's safety. The patient's presenting symptoms, physical exam findings, and initial radiographic and laboratory data in the context of their chronic comorbidities is felt to place them at high risk for further clinical deterioration. Furthermore, it is not anticipated that the patient will be medically stable for discharge from the hospital within 2 midnights of admission. The following factors support the patient status of inpatient.   " The patient's presenting symptoms include shortness of breath and altered mental status. " The worrisome physical exam findings include altered mental status. " The initial radiographic and laboratory data are worrisome because of ABG showed hypercarbia with respiratory failure. " The chronic co-morbidities include posttraumatic stress disorder.   * I certify that at the point of admission it is my clinical judgment that the patient will require inpatient hospital care spanning beyond 2 midnights from the point of admission due to high intensity of service, high risk for further deterioration and high frequency of surveillance required.Barbette Merino MD [facility] Pager [phone]  If 7PM-7AM, please contact  night-coverage [email or web address] Password [email or web address]  08/04/2018, 10:13 PM

## 2018-08-04 NOTE — ED Notes (Signed)
Pt making a hoarse noise in her throat whenever she breathes  Found to have taken her nasal cannula from her nose  sats in the high 80s

## 2018-08-04 NOTE — ED Notes (Signed)
Pt remains on bi-pap  Rouses when moved around otherwise she stgays asleep

## 2018-08-04 NOTE — ED Provider Notes (Signed)
[facility] Provider Note   CSN: [id number] Arrival date & time: 08/04/18  1617    History   Chief Complaint Chief Complaint  Patient presents with   Shortness of Breath    HPI Meghan Contreras is a 53 y.o. female.     The history is provided by the patient and medical records.  Shortness of Breath  Severity:  Moderate Onset quality:  Gradual Duration:  3 days Timing:  Constant Progression:  Worsening Chronicity:  New Context: activity, smoke exposure and URI   Context: not known allergens and not weather changes   Relieved by:  Nothing Worsened by:  Activity, coughing, deep breathing, exertion, stress and smoke exposure Ineffective treatments:  Sitting up, position changes, rest and lying down Associated symptoms: cough and wheezing   Associated symptoms: no abdominal pain, no chest pain, no diaphoresis, no fever, no headaches, no hemoptysis, no PND, no sputum production and no vomiting   Risk factors: tobacco use   Risk factors: no recent alcohol use     Past Medical History:  Diagnosis Date   Anxiety    Depression    HOH (hard of hearing)    Hyperthyroidism    PTSD (post-traumatic stress disorder)     There are no active problems to display for this patient.   Past Surgical History:  Procedure Laterality Date   THYROIDECTOMY  2019     OB History   No obstetric history on file.      Home Medications    Prior to Admission medications   Medication Sig Start Date End Date Taking? Authorizing Provider  buprenorphine-naloxone (SUBOXONE) 8-2 mg SUBL SL tablet Place 1 tablet under the tongue 2 (two) times daily.    [provider]  chlordiazePOXIDE (LIBRIUM) 25 MG capsule Take 1 capsule (25 mg total) by mouth 3 (three) times daily as needed for withdrawal. 11/04/15   Everlene Balls, MD  estradiol (ESTRACE) 2 MG tablet Take 2 mg by mouth daily.    [provider]  ibuprofen (ADVIL,MOTRIN) 600 MG  tablet Take 600 mg by mouth once.    [provider]  morphine (MS CONTIN) 30 MG 12 hr tablet Take 30 mg by mouth once.    [provider]  prazosin (MINIPRESS) 2 MG capsule Take 4 mg by mouth at bedtime.    [provider]  QUEtiapine (SEROQUEL) 100 MG tablet Take 100 mg by mouth daily.    [provider]  QUEtiapine (SEROQUEL) 300 MG tablet Take 300 mg by mouth at bedtime.    [provider]    Family History No family history on file.  Social History Social History   Tobacco Use   Smoking status: Current Some Day Smoker   Smokeless tobacco: Never Used  Substance Use Topics   Alcohol use: No   Drug use: Yes    Comment: Benzodiazepines / Klonopin addiction      Allergies   Amoxicillin   Review of Systems Review of Systems  Constitutional: Negative for diaphoresis and fever.  Respiratory: Positive for cough, shortness of breath and wheezing. Negative for hemoptysis and sputum production.   Cardiovascular: Negative for chest pain and PND.  Gastrointestinal: Negative for abdominal pain and vomiting.  Neurological: Negative for headaches.  All other systems reviewed and are negative.    Physical Exam Updated Vital Signs BP 123/76    Pulse 71    Temp 98.4 F (36.9 C) (Oral)    Resp 16  Ht 5\' 3"  (1.6 m)    Wt 73 kg    SpO2 95%    BMI 28.52 kg/m   Physical Exam Vitals signs and nursing note reviewed.  Constitutional:      Appearance: She is well-developed.  HENT:     Head: Normocephalic and atraumatic.     Mouth/Throat:     Mouth: Mucous membranes are moist.     Pharynx: No pharyngeal swelling.     Comments: Uvula midline No obvious asymmetric palatine elevation No obvious swellings posterior oropharynx    Patient able to move neck in all directions without difficulty, tolerating secretions appropriately  Eyes:     Conjunctiva/sclera: Conjunctivae normal.  Neck:     Musculoskeletal: Neck supple.      Comments: Fullness appreciated bilateral neck, no obvious tenderness with palpation, swelling is not fluctuant, no induration or overlying skin changes, no discernible adenopathy  No fullness submandibular space, floor of mouth soft, nonraised  Prior scar from previous thyroid surgery Cardiovascular:     Rate and Rhythm: Normal rate.  Pulmonary:     Effort: Tachypnea present.     Breath sounds: Decreased breath sounds present.     Comments: Noisy breathing with inspiration regardless of whether patient is breathing through mouth or through nares, difficult to discern, not exactly stridor, no obvious wheezing but tight breath sounds throughout, noisy coarse breath sounds throughout Abdominal:     Palpations: Abdomen is soft.     Tenderness: There is no abdominal tenderness.  Musculoskeletal:     Right lower leg: No edema.     Left lower leg: No edema.  Skin:    General: Skin is warm and dry.  Neurological:     General: No focal deficit present.     Mental Status: She is alert and oriented to person, place, and time.      ED Treatments / Results  Labs (all labs ordered are listed, but only abnormal results are displayed) Labs Reviewed  CBC WITH DIFFERENTIAL/PLATELET - Abnormal; Notable for the following components:      Result Value   RBC 3.85 (*)    Hemoglobin 10.9 (*)    HCT 34.0 (*)    RDW 17.2 (*)    All other components within normal limits  BASIC METABOLIC PANEL - Abnormal; Notable for the following components:   CO2 36 (*)    Creatinine, Ser 1.39 (*)    GFR calc non Af Amer 44 (*)    GFR calc Af Amer 51 (*)    Anion gap 4 (*)    All other components within normal limits  RAPID URINE DRUG SCREEN, HOSP PERFORMED - Abnormal; Notable for the following components:   Benzodiazepines POSITIVE (*)    All other components within normal limits  POCT I-STAT EG7 - Abnormal; Notable for the following components:   pCO2, Ven 61.0 (*)    pO2, Ven 48.0 (*)    Bicarbonate 35.1  (*)    TCO2 37 (*)    Acid-Base Excess 8.0 (*)    Calcium, Ion 1.14 (*)    HCT 33.0 (*)    Hemoglobin 11.2 (*)    All other components within normal limits  POCT I-STAT 7, (LYTES, BLD GAS, ICA,H+H) - Abnormal; Notable for the following components:   pH, Arterial 7.263 (*)    pCO2 arterial 76.3 (*)    pO2, Arterial 78.0 (*)    Bicarbonate 34.5 (*)    TCO2 37 (*)    Acid-Base Excess  5.0 (*)    HCT 32.0 (*)    Hemoglobin 10.9 (*)    All other components within normal limits  SARS CORONAVIRUS 2 (HOSPITAL ORDER, [place] [facility])  BRAIN NATRIURETIC PEPTIDE  TROPONIN I  MAGNESIUM  BLOOD GAS, ARTERIAL  I-STAT VENOUS BLOOD GAS, ED  I-STAT BETA HCG BLOOD, ED ([facility], [facility], [facility] ONLY)    EKG EKG Interpretation  Date/Time:  Friday August 04 2018 16:27:15 EDT Ventricular Rate:  86 PR Interval:  164 QRS Duration: 94 QT Interval:  340 QTC Calculation: 406 R Axis:   69 Text Interpretation:  Normal sinus rhythm Nonspecific T wave abnormality Confirmed by Lajean Saver [phone]) on 08/04/2018 4:53:19 PM   Radiology Ct Soft Tissue Neck W Contrast  Result Date: 08/04/2018 CLINICAL DATA:  Shortness of breath for 6 months, worsening recently. Thyroid removal. EXAM: CT NECK WITH CONTRAST TECHNIQUE: Multidetector CT imaging of the neck was performed using the standard protocol following the bolus administration of intravenous contrast. CONTRAST:  27mL OMNIPAQUE IOHEXOL 300 MG/ML  SOLN COMPARISON:  None. FINDINGS: The patient was breathing and swallowing throughout the exam. Image quality is reduced. The study is suboptimal probably diagnostic. Pharynx and larynx: Normal. No mass or swelling. No subglottic narrowing. Salivary glands: No inflammation, mass, or stone. Thyroid: Surgically absent. No hyperattenuating thyroid tissue is seen. Lymph nodes: None enlarged or abnormal density. Vascular: Patent Limited intracranial: Negative. Visualized orbits: Negative. Mastoids and visualized  paranasal sinuses: Negative. Skeleton: Spondylosis.  Poor dentition. Upper chest: No visible mass or pneumothorax. Other: None. IMPRESSION: Poor quality examination due to patient breathing and swallowing demonstrating no pharyngeal or laryngeal mass. No subglottic narrowing. No apparent complications of thyroid surgery. Electronically Signed   By: Staci Righter M.D.   On: 08/04/2018 21:09   Dg Chest Portable 1 View  Result Date: 08/04/2018 CLINICAL DATA:  Chronic shortness of breath for the past 6 months. EXAM: PORTABLE CHEST 1 VIEW COMPARISON:  Chest x-ray dated October 11, 2017. FINDINGS: The heart size and mediastinal contours are within normal limits. Normal pulmonary vascularity. Low lung volumes with bibasilar atelectasis. No pleural effusion or pneumothorax. No acute osseous abnormality. IMPRESSION: Low lung volumes with bibasilar atelectasis. Electronically Signed   By: Titus Dubin M.D.   On: 08/04/2018 18:08    Procedures Procedures (including critical care time)  Medications Ordered in ED Medications  albuterol (VENTOLIN HFA) 108 (90 Base) MCG/ACT inhaler 4 puff (4 puffs Inhalation Not Given 08/04/18 2038)  methylPREDNISolone sodium succinate (SOLU-MEDROL) 125 mg/2 mL injection 125 mg (125 mg Intravenous Given 08/04/18 1749)  albuterol (VENTOLIN HFA) 108 (90 Base) MCG/ACT inhaler 2 puff (2 puffs Inhalation Given 08/04/18 1801)  albuterol (PROVENTIL) (2.5 MG/3ML) 0.083% nebulizer solution 2.5 mg (2.5 mg Nebulization Given 08/04/18 2110)  iohexol (OMNIPAQUE) 300 MG/ML solution 75 mL (75 mLs Intravenous Contrast Given 08/04/18 2040)     Initial Impression / Assessment and Plan / ED Course  I have reviewed the triage vital signs and the nursing notes.  Pertinent labs & imaging results that were available during my care of the patient were reviewed by me and considered in my medical decision making (see chart for details).         Medical Decision Making:  Meghan Contreras is a 53 y.o.  female who presented to the ED today with shortness of breath, noisy breathing.  Past medical history significant for hyperthyroidism, history of goiter, previous thyroid surgery in [place], PTSD, anxiety, depression, do not have access to  majority of patient's medical records which are through [facility] and confirmed nursing documentation for past medical history, family history, social history.  On my initial exam, the pt was calm, alert and interactive, O2 saturation in triage was high 80s, improved to 99% on 2 L nasal cannula, noisy breathing throughout, not tachycardic, not hypotensive, afebrile  Etiology difficult to discern, could be secondary to viral versus bacterial process although no other obvious infectious review of systems, afebrile, no fever or chills, cough at baseline secondary to smoking, voice changes secondary to prior thyroid surgery, floor of mouth soft, nonraised, patient tolerating secretions maintaining airway independently.  Lower suspicion for Ludwig's.  Will obtain CT scan to further assess for any process in the neck, to assess for PTA, RPA, swelling.  Lung sounds are difficult to auscultate over noisy breathing but no gross abnormalities, will obtain chest x-ray for further assessment.  Patient given steroids, beta agonist puffs. Initial blood gas 7.36, PCO2 60, creatinine 1.4, CO2 36, magnesium 2.1, BNP, troponin negative, no significant leukocytosis or anemia All radiology and laboratory studies reviewed independently and with my attending physician, agree with reading provided by radiologist unless otherwise noted.   Upon reassessing patient, patient was worsening dyspnea, intermittent sleepiness, arterial blood gas showed pH 7.2, CO2 76, COVID testing negative, will place patient on BiPAP, nebulizer treatment.  Urine drug screen positive for benzodiazepines.  CT neck showed no emergent abnormalities. Based on the above findings, I  believe patient requires admission. Patient admitted.  The above care was discussed with and agreed upon by my attending physician.  Emergency Department Medication Summary:  Medications  albuterol (VENTOLIN HFA) 108 (90 Base) MCG/ACT inhaler 4 puff (4 puffs Inhalation Not Given 08/04/18 2038)  methylPREDNISolone sodium succinate (SOLU-MEDROL) 125 mg/2 mL injection 125 mg (125 mg Intravenous Given 08/04/18 1749)  albuterol (VENTOLIN HFA) 108 (90 Base) MCG/ACT inhaler 2 puff (2 puffs Inhalation Given 08/04/18 1801)  albuterol (PROVENTIL) (2.5 MG/3ML) 0.083% nebulizer solution 2.5 mg (2.5 mg Nebulization Given 08/04/18 2110)  iohexol (OMNIPAQUE) 300 MG/ML solution 75 mL (75 mLs Intravenous Contrast Given 08/04/18 2040)         Lonzo Candy, MD 08/04/18 2154    Lajean Saver, MD 08/04/18 2236

## 2018-08-04 NOTE — Progress Notes (Signed)
Pt. Ripped off her bipap mask. Pt. States she is hungry. RT placed pt. On nasal cannula and alerted the RN.

## 2018-08-04 NOTE — ED Triage Notes (Signed)
Pt c/o shortness of breath x 6 months, worsening recently. Pt reports she had her thyroid removed 6 mos ago and has been having difficulty with her breathing.

## 2018-08-04 NOTE — ED Notes (Signed)
Pt alert will not answer questions but she does make good eye contact

## 2018-08-04 NOTE — ED Notes (Signed)
Pt placed on bipap  

## 2018-08-04 NOTE — ED Notes (Signed)
Montine Circle (boyfriend) [phone] call for updates please

## 2018-08-05 ENCOUNTER — Encounter (HOSPITAL_COMMUNITY): Payer: Self-pay

## 2018-08-05 ENCOUNTER — Encounter (HOSPITAL_COMMUNITY): Payer: Self-pay | Admitting: *Deleted

## 2018-08-05 ENCOUNTER — Other Ambulatory Visit: Payer: Self-pay

## 2018-08-05 DIAGNOSIS — G9341 Metabolic encephalopathy: Secondary | ICD-10-CM

## 2018-08-05 DIAGNOSIS — Z720 Tobacco use: Secondary | ICD-10-CM

## 2018-08-05 DIAGNOSIS — J9601 Acute respiratory failure with hypoxia: Secondary | ICD-10-CM

## 2018-08-05 DIAGNOSIS — J441 Chronic obstructive pulmonary disease with (acute) exacerbation: Secondary | ICD-10-CM

## 2018-08-05 DIAGNOSIS — J9602 Acute respiratory failure with hypercapnia: Secondary | ICD-10-CM

## 2018-08-05 LAB — CBC
HCT: 34.7 % — ABNORMAL LOW (ref 36.0–46.0)
Hemoglobin: 11.1 g/dL — ABNORMAL LOW (ref 12.0–15.0)
MCH: 28.4 pg (ref 26.0–34.0)
MCHC: 32 g/dL (ref 30.0–36.0)
MCV: 88.7 fL (ref 80.0–100.0)
Platelets: 283 10*3/uL (ref 150–400)
RBC: 3.91 MIL/uL (ref 3.87–5.11)
RDW: 17.2 % — ABNORMAL HIGH (ref 11.5–15.5)
WBC: 5.2 10*3/uL (ref 4.0–10.5)
nRBC: 0 % (ref 0.0–0.2)

## 2018-08-05 LAB — COMPREHENSIVE METABOLIC PANEL
ALT: 36 U/L (ref 0–44)
AST: 54 U/L — ABNORMAL HIGH (ref 15–41)
Albumin: 4.3 g/dL (ref 3.5–5.0)
Alkaline Phosphatase: 45 U/L (ref 38–126)
Anion gap: 8 (ref 5–15)
BUN: 18 mg/dL (ref 6–20)
CO2: 34 mmol/L — ABNORMAL HIGH (ref 22–32)
Calcium: 9 mg/dL (ref 8.9–10.3)
Chloride: 100 mmol/L (ref 98–111)
Creatinine, Ser: 1.43 mg/dL — ABNORMAL HIGH (ref 0.44–1.00)
GFR calc Af Amer: 49 mL/min — ABNORMAL LOW (ref >60)
GFR calc non Af Amer: 42 mL/min — ABNORMAL LOW (ref >60)
Glucose, Bld: 252 mg/dL — ABNORMAL HIGH (ref 70–99)
Potassium: 4.3 mmol/L (ref 3.5–5.1)
Sodium: 142 mmol/L (ref 135–145)
Total Bilirubin: 0.4 mg/dL (ref 0.3–1.2)
Total Protein: 8.2 g/dL — ABNORMAL HIGH (ref 6.5–8.1)

## 2018-08-05 MED ORDER — METHYLPREDNISOLONE SODIUM SUCC 125 MG IJ SOLR
60.0000 mg | Freq: Four times a day (QID) | INTRAMUSCULAR | Status: DC
  Filled 2018-08-05: qty 2

## 2018-08-05 MED ORDER — ENOXAPARIN SODIUM 40 MG/0.4ML ~~LOC~~ SOLN
40.0000 mg | Freq: Every day | SUBCUTANEOUS | Status: DC
  Administered 2018-08-05: 40 mg via SUBCUTANEOUS

## 2018-08-05 MED ORDER — ONDANSETRON HCL 4 MG PO TABS: 4.0000 mg | ORAL_TABLET | Freq: Four times a day (QID) | ORAL | Status: DC | PRN

## 2018-08-05 MED ORDER — AZITHROMYCIN 250 MG PO TABS: 500.0000 mg | ORAL_TABLET | ORAL | Status: DC

## 2018-08-05 MED ORDER — ORAL CARE MOUTH RINSE
15.0000 mL | Freq: Two times a day (BID) | OROMUCOSAL | Status: DC
  Administered 2018-08-05: 15 mL via OROMUCOSAL

## 2018-08-05 MED ORDER — LEVOFLOXACIN IN D5W 750 MG/150ML IV SOLN
750.0000 mg | Freq: Once | INTRAVENOUS | Status: AC
  Administered 2018-08-05: 750 mg via INTRAVENOUS
  Filled 2018-08-05 (×2): qty 150

## 2018-08-05 MED ORDER — METHYLPREDNISOLONE SODIUM SUCC 125 MG IJ SOLR
125.0000 mg | Freq: Four times a day (QID) | INTRAMUSCULAR | Status: DC
  Administered 2018-08-05 (×2): 125 mg via INTRAVENOUS
  Filled 2018-08-05 (×2): qty 2

## 2018-08-05 MED ORDER — ONDANSETRON HCL 4 MG/2ML IJ SOLN: 4.0000 mg | Freq: Four times a day (QID) | INTRAMUSCULAR | Status: DC | PRN

## 2018-08-05 MED ORDER — BUPRENORPHINE HCL-NALOXONE HCL 8-2 MG SL SUBL: 1.0000 | SUBLINGUAL_TABLET | Freq: Two times a day (BID) | SUBLINGUAL | Status: DC

## 2018-08-05 MED ORDER — MOMETASONE FURO-FORMOTEROL FUM 100-5 MCG/ACT IN AERO
2.0000 | INHALATION_SPRAY | Freq: Two times a day (BID) | RESPIRATORY_TRACT | Status: DC
  Filled 2018-08-05: qty 8.8

## 2018-08-05 MED ORDER — LEVOFLOXACIN IN D5W 750 MG/150ML IV SOLN: 750.0000 mg | INTRAVENOUS | Status: DC

## 2018-08-05 MED ORDER — IPRATROPIUM-ALBUTEROL 0.5-2.5 (3) MG/3ML IN SOLN
3.0000 mL | Freq: Four times a day (QID) | RESPIRATORY_TRACT | Status: DC
  Administered 2018-08-05 (×2): 3 mL via RESPIRATORY_TRACT
  Filled 2018-08-05 (×2): qty 3

## 2018-08-05 MED ORDER — DEXTROSE-NACL 5-0.9 % IV SOLN
INTRAVENOUS | Status: DC
  Administered 2018-08-05: 02:00:00 via INTRAVENOUS

## 2018-08-05 NOTE — Progress Notes (Signed)
Patient expressed desire to leave hospital against medical advice. Dr. Starla Link notified via text page. AMA paperwork provided to patient; patient signed paperwork in presence of this RN as witness.

## 2018-08-05 NOTE — Progress Notes (Signed)
Patient ID: Meghan Contreras, female   DOB: [birth year], 53 y.o.   MRN: [id number]  PROGRESS NOTE    Dreanna Kyllo  JZP:[id number] DOB: 1965/04/14 DOA: 08/04/2018 PCP: Patient, No Pcp Per   Brief Narrative:  53 year old female with history of posttraumatic stress disorder, hyper thyroidism status post thyroidectomy, anxiety disorder with depression presented with altered mental status on 08/04/2018 along with acute hypoxic/hypercapnic respiratory failure requiring BiPAP.  Chest x-ray showed bibasilar atelectasis.  COVID-19 initial testing was negative.  CT neck soft tissue showed no neck masses.  She was admitted for COPD exacerbation and started on intravenous steroids.  Assessment & Plan:   Principal Problem:   Acute on chronic respiratory failure with hypoxemia (HCC) Active Problems:   COPD with acute exacerbation (HCC)   Hyperthyroidism   Acute metabolic encephalopathy  Acute respiratory failure with hypercapnia and hypoxia Probable COPD exacerbation Acute metabolic encephalopathy secondary to hypercarbia Tobacco abuse -Patient currently smokes 1 pack of cigarettes per day.  Counseled regarding tobacco cessation -Presented with extreme dyspnea and required BiPAP on presentation; PCO2 was found to be 76.3. -Respiratory status is improving.  Currently on 4 L oxygen via nasal cannula.  Wean off as able.  Incentive spirometry. -CT of the neck was negative for any neck masses.  She still has some wheezing and she also probably voluntarily makes whistling noise from either her nose or mouth. -Currently on Solu-Medrol 125 mg IV every 6 hours.  Will decrease to 60 mg every 6 hours. -Chest x-ray showed bibasilar atelectasis with no infiltrates.  COVID-19 testing was negative.  If respiratory status worsens, will consider CT of the chest.  Patient will need outpatient pulmonary evaluation and follow-up. -Continue duo nebs.  Add Dulera.  DC Levaquin.  Start oral Zithromax -Urine drug screen was positive  only for benzodiazepines. -Mental status has much improved but patient still poor historian.  Anxiety/depression/PTSD -We will need to verify her home regimen prior to restarting it. -Patient states that she recently moved from [place] and does not have a PCP locally.  But she is apparently taking Suboxone, Seroquel and is demanding that these medications be restarted.  Will ask pharmacy to verify the same.  Will hold off on restarting these medications because of her extreme dyspnea and encephalopathy on presentation.  Generalized deconditioning -PT evaluation.   DVT prophylaxis: Lovenox Code Status: Full Family Communication: None at bedside Disposition Plan: Home in 2 to 3 days once clinically improved  Consultants: None  Procedures: None  Antimicrobials: Levaquin from 08/04/2018 onwards   Subjective: Patient seen and examined at bedside.  She is a poor historian.  Intermittently gets upset.  Feels slightly better.  Still short of breath and coughing.  No overnight fever or vomiting.  Objective: Vitals:   08/05/18 0105 08/05/18 0443 08/05/18 0739 08/05/18 0810  BP: 123/79 116/90  115/88  Pulse: 82 77  60  Resp:      Temp: (!) 97.4 F (36.3 C) 97.7 F (36.5 C)  97.7 F (36.5 C)  TempSrc: Oral Oral  Oral  SpO2: 93% 97% 97% 91%  Weight: 71.9 kg     Height: 5\' 3"  (1.6 m)       Intake/Output Summary (Last 24 hours) at 08/05/2018 1159 Last data filed at 08/05/2018 1051 Gross per 24 hour  Intake 714.54 ml  Output 300 ml  Net 414.54 ml   Filed Weights   08/04/18 1644 08/05/18 0105  Weight: 73 kg 71.9 kg    Examination:  General exam: Appears  calm and comfortable.  Looks older than stated age.  Extremely poor historian.  Intermittently gets upset and angry.  Makes intermittent whistling noise either from nose or mouth and intermittently stops making it once asked to do so. Neck: Supple.  No JVD Respiratory system: Bilateral decreased breath sounds at bases with  diffuse wheezing Cardiovascular system: S1 & S2 heard, Rate controlled Gastrointestinal system: Abdomen is nondistended, soft and nontender. Normal bowel sounds heard. Extremities: No cyanosis, clubbing, edema  Central nervous system: Alert awake but a poor historian. No focal neurological deficits. Moving extremities Skin: No rashes, lesions or ulcers Psychiatry: Anxious.  Intermittently gets upset and angry.    Data Reviewed: I have personally reviewed following labs and imaging studies  CBC: Recent Labs  Lab 08/04/18 1711 08/04/18 1721 08/04/18 2020 08/05/18 0335  WBC 4.9  --   --  5.2  NEUTROABS 2.3  --   --   --   HGB 10.9* 11.2* 10.9* 11.1*  HCT 34.0* 33.0* 32.0* 34.7*  MCV 88.3  --   --  88.7  PLT 293  --   --  852   Basic Metabolic Panel: Recent Labs  Lab 08/04/18 1711 08/04/18 1721 08/04/18 2020 08/05/18 0335  NA 141 143 140 142  K 3.7 3.7 3.8 4.3  CL 101  --   --  100  CO2 36*  --   --  34*  GLUCOSE 95  --   --  252*  BUN 20  --   --  18  CREATININE 1.39*  --   --  1.43*  CALCIUM 9.4  --   --  9.0  MG 2.1  --   --   --    GFR: Estimated Creatinine Clearance: 44.2 mL/min (A) (by C-G formula based on SCr of 1.43 mg/dL (H)). Liver Function Tests: Recent Labs  Lab 08/05/18 0335  AST 54*  ALT 36  ALKPHOS 45  BILITOT 0.4  PROT 8.2*  ALBUMIN 4.3   No results for input(s): LIPASE, AMYLASE in the last 168 hours. No results for input(s): AMMONIA in the last 168 hours. Coagulation Profile: No results for input(s): INR, PROTIME in the last 168 hours. Cardiac Enzymes: Recent Labs  Lab 08/04/18 1711  TROPONINI <0.03   BNP (last 3 results) No results for input(s): PROBNP in the last 8760 hours. HbA1C: No results for input(s): HGBA1C in the last 72 hours. CBG: No results for input(s): GLUCAP in the last 168 hours. Lipid Profile: No results for input(s): CHOL, HDL, LDLCALC, TRIG, CHOLHDL, LDLDIRECT in the last 72 hours. Thyroid Function Tests: No  results for input(s): TSH, T4TOTAL, FREET4, T3FREE, THYROIDAB in the last 72 hours. Anemia Panel: No results for input(s): VITAMINB12, FOLATE, FERRITIN, TIBC, IRON, RETICCTPCT in the last 72 hours. Sepsis Labs: No results for input(s): PROCALCITON, LATICACIDVEN in the last 168 hours.  Recent Results (from the past 240 hour(s))  SARS Coronavirus 2 (CEPHEID - Performed in [place] hospital lab), Hosp Order     Status: None   Collection Time: 08/04/18  6:08 PM  Result Value Ref Range Status   SARS Coronavirus 2 NEGATIVE NEGATIVE Final    Comment: (NOTE) If result is NEGATIVE SARS-CoV-2 target nucleic acids are NOT DETECTED. The SARS-CoV-2 RNA is generally detectable in upper and lower  respiratory specimens during the acute phase of infection. The lowest  concentration of SARS-CoV-2 viral copies this assay can detect is 250  copies / mL. A negative result does not preclude SARS-CoV-2  infection  and should not be used as the sole basis for treatment or other  patient management decisions.  A negative result may occur with  improper specimen collection / handling, submission of specimen other  than nasopharyngeal swab, presence of viral mutation(s) within the  areas targeted by this assay, and inadequate number of viral copies  (<250 copies / mL). A negative result must be combined with clinical  observations, patient history, and epidemiological information. If result is POSITIVE SARS-CoV-2 target nucleic acids are DETECTED. The SARS-CoV-2 RNA is generally detectable in upper and lower  respiratory specimens dur ing the acute phase of infection.  Positive  results are indicative of active infection with SARS-CoV-2.  Clinical  correlation with patient history and other diagnostic information is  necessary to determine patient infection status.  Positive results do  not rule out bacterial infection or co-infection with other viruses. If result is PRESUMPTIVE POSTIVE SARS-CoV-2 nucleic  acids MAY BE PRESENT.   A presumptive positive result was obtained on the submitted specimen  and confirmed on repeat testing.  While 2019 novel coronavirus  (SARS-CoV-2) nucleic acids may be present in the submitted sample  additional confirmatory testing may be necessary for epidemiological  and / or clinical management purposes  to differentiate between  SARS-CoV-2 and other Sarbecovirus currently known to infect humans.  If clinically indicated additional testing with an alternate test  methodology [phone]) is advised. The SARS-CoV-2 RNA is generally  detectable in upper and lower respiratory sp ecimens during the acute  phase of infection. The expected result is Negative. Fact Sheet for Patients:  StrictlyIdeas.no Fact Sheet for Healthcare Providers: BankingDealers.co.za This test is not yet approved or cleared by the [place] FDA and has been authorized for detection and/or diagnosis of SARS-CoV-2 by FDA under an Emergency Use Authorization (EUA).  This EUA will remain in effect (meaning this test can be used) for the duration of the COVID-19 declaration under Section 564(b)(1) of the Act, 21 U.S.C. section 360bbb-3(b)(1), unless the authorization is terminated or revoked sooner. Performed at [facility], [place] [address] [place], [place] [address]          Radiology Studies: Ct Soft Tissue Neck W Contrast  Result Date: 08/04/2018 CLINICAL DATA:  Shortness of breath for 6 months, worsening recently. Thyroid removal. EXAM: CT NECK WITH CONTRAST TECHNIQUE: Multidetector CT imaging of the neck was performed using the standard protocol following the bolus administration of intravenous contrast. CONTRAST:  16mL OMNIPAQUE IOHEXOL 300 MG/ML  SOLN COMPARISON:  None. FINDINGS: The patient was breathing and swallowing throughout the exam. Image quality is reduced. The study is suboptimal probably diagnostic. Pharynx and larynx:  Normal. No mass or swelling. No subglottic narrowing. Salivary glands: No inflammation, mass, or stone. Thyroid: Surgically absent. No hyperattenuating thyroid tissue is seen. Lymph nodes: None enlarged or abnormal density. Vascular: Patent Limited intracranial: Negative. Visualized orbits: Negative. Mastoids and visualized paranasal sinuses: Negative. Skeleton: Spondylosis.  Poor dentition. Upper chest: No visible mass or pneumothorax. Other: None. IMPRESSION: Poor quality examination due to patient breathing and swallowing demonstrating no pharyngeal or laryngeal mass. No subglottic narrowing. No apparent complications of thyroid surgery. Electronically Signed   By: Staci Righter M.D.   On: 08/04/2018 21:09   Dg Chest Portable 1 View  Result Date: 08/04/2018 CLINICAL DATA:  Chronic shortness of breath for the past 6 months. EXAM: PORTABLE CHEST 1 VIEW COMPARISON:  Chest x-ray dated October 11, 2017. FINDINGS: The heart size and mediastinal contours  are within normal limits. Normal pulmonary vascularity. Low lung volumes with bibasilar atelectasis. No pleural effusion or pneumothorax. No acute osseous abnormality. IMPRESSION: Low lung volumes with bibasilar atelectasis. Electronically Signed   By: Titus Dubin M.D.   On: 08/04/2018 18:08        Scheduled Meds:  enoxaparin (LOVENOX) injection  40 mg Subcutaneous Daily   ipratropium-albuterol  3 mL Nebulization Q6H   mouth rinse  15 mL Mouth Rinse BID   methylPREDNISolone (SOLU-MEDROL) injection  125 mg Intravenous Q6H   Continuous Infusions:  dextrose 5 % and 0.9% NaCl 100 mL/hr at 08/05/18 0143   [START ON 08/06/2018] levofloxacin (LEVAQUIN) IV       LOS: 1 day        Aline August, MD [facility] 08/05/2018, 11:59 AM

## 2018-08-06 LAB — HIV ANTIBODY (ROUTINE TESTING W REFLEX): HIV Screen 4th Generation wRfx: NONREACTIVE

## 2018-08-07 NOTE — Discharge Summary (Signed)
Physician Discharge Summary  Meghan Contreras YSA:[id number] DOB: 13-Feb-1966 DOA: 08/04/2018  PCP: Patient, No Pcp Per  Admit date: 08/04/2018 Discharge date: 08/05/2018 Admitted From: Home Disposition: Signed out [place]  Discharge Condition: Guarded CODE STATUS: Full   Brief/Interim Summary: 53 year old female with history of posttraumatic stress disorder, hyper thyroidism status post thyroidectomy, anxiety disorder with depression presented with altered mental status on 08/04/2018 along with acute hypoxic/hypercapnic respiratory failure requiring BiPAP.  Chest x-ray showed bibasilar atelectasis.  COVID-19 initial testing was negative.  CT neck soft tissue showed no neck masses.  She was admitted for COPD exacerbation and started on intravenous steroids.  Her condition was slightly improving but she was not ready for discharge.  She signed out [place] on 08/05/2018 despite being explained the risks for doing so including worsening of her condition and death.   Discharge Diagnoses:  Principal Problem:   Acute on chronic respiratory failure with hypoxemia (HCC) Active Problems:   COPD with acute exacerbation (HCC)   Hyperthyroidism   Acute metabolic encephalopathy  Acute respiratory failure with hypercapnia and hypoxia Probable COPD exacerbation Acute metabolic encephalopathy secondary to hypercarbia Tobacco abuse Anxiety/depression/PTSD Chronic pain: On Suboxone as an outpatient Generalized deconditioning  Plan -Patient initially required BiPAP on presentation and PCO2 was found to be 76.3.  Respiratory status was improving during the hospitalization but she was not ready to be discharged.  CT of the neck was negative for any neck masses.  Solu-Medrol was being decreased.  Ceftriaxone bibasilar atelectasis with no infiltrates.  COVID-19 testing was negative. -She signed out [place] on 08/05/2018 despite being explained the risks for doing so including  worsening of her condition and death.     Consultations: None  Procedures/Studies: Ct Soft Tissue Neck W Contrast  Result Date: 08/04/2018 CLINICAL DATA:  Shortness of breath for 6 months, worsening recently. Thyroid removal. EXAM: CT NECK WITH CONTRAST TECHNIQUE: Multidetector CT imaging of the neck was performed using the standard protocol following the bolus administration of intravenous contrast. CONTRAST:  14mL OMNIPAQUE IOHEXOL 300 MG/ML  SOLN COMPARISON:  None. FINDINGS: The patient was breathing and swallowing throughout the exam. Image quality is reduced. The study is suboptimal probably diagnostic. Pharynx and larynx: Normal. No mass or swelling. No subglottic narrowing. Salivary glands: No inflammation, mass, or stone. Thyroid: Surgically absent. No hyperattenuating thyroid tissue is seen. Lymph nodes: None enlarged or abnormal density. Vascular: Patent Limited intracranial: Negative. Visualized orbits: Negative. Mastoids and visualized paranasal sinuses: Negative. Skeleton: Spondylosis.  Poor dentition. Upper chest: No visible mass or pneumothorax. Other: None. IMPRESSION: Poor quality examination due to patient breathing and swallowing demonstrating no pharyngeal or laryngeal mass. No subglottic narrowing. No apparent complications of thyroid surgery. Electronically Signed   By: Staci Righter M.D.   On: 08/04/2018 21:09   Dg Chest Portable 1 View  Result Date: 08/04/2018 CLINICAL DATA:  Chronic shortness of breath for the past 6 months. EXAM: PORTABLE CHEST 1 VIEW COMPARISON:  Chest x-ray dated October 11, 2017. FINDINGS: The heart size and mediastinal contours are within normal limits. Normal pulmonary vascularity. Low lung volumes with bibasilar atelectasis. No pleural effusion or pneumothorax. No acute osseous abnormality. IMPRESSION: Low lung volumes with bibasilar atelectasis. Electronically Signed   By: Titus Dubin M.D.   On: 08/04/2018 18:08       The results of significant  diagnostics from this hospitalization (including imaging, microbiology, ancillary and laboratory) are listed below for reference.     Microbiology: Recent Results (  from the past 240 hour(s))  SARS Coronavirus 2 (CEPHEID - Performed in [place] hospital lab), Hosp Order     Status: None   Collection Time: 08/04/18  6:08 PM  Result Value Ref Range Status   SARS Coronavirus 2 NEGATIVE NEGATIVE Final    Comment: (NOTE) If result is NEGATIVE SARS-CoV-2 target nucleic acids are NOT DETECTED. The SARS-CoV-2 RNA is generally detectable in upper and lower  respiratory specimens during the acute phase of infection. The lowest  concentration of SARS-CoV-2 viral copies this assay can detect is 250  copies / mL. A negative result does not preclude SARS-CoV-2 infection  and should not be used as the sole basis for treatment or other  patient management decisions.  A negative result may occur with  improper specimen collection / handling, submission of specimen other  than nasopharyngeal swab, presence of viral mutation(s) within the  areas targeted by this assay, and inadequate number of viral copies  (<250 copies / mL). A negative result must be combined with clinical  observations, patient history, and epidemiological information. If result is POSITIVE SARS-CoV-2 target nucleic acids are DETECTED. The SARS-CoV-2 RNA is generally detectable in upper and lower  respiratory specimens dur ing the acute phase of infection.  Positive  results are indicative of active infection with SARS-CoV-2.  Clinical  correlation with patient history and other diagnostic information is  necessary to determine patient infection status.  Positive results do  not rule out bacterial infection or co-infection with other viruses. If result is PRESUMPTIVE POSTIVE SARS-CoV-2 nucleic acids MAY BE PRESENT.   A presumptive positive result was obtained on the submitted specimen  and confirmed on repeat testing.  While  2019 novel coronavirus  (SARS-CoV-2) nucleic acids may be present in the submitted sample  additional confirmatory testing may be necessary for epidemiological  and / or clinical management purposes  to differentiate between  SARS-CoV-2 and other Sarbecovirus currently known to infect humans.  If clinically indicated additional testing with an alternate test  methodology [phone]) is advised. The SARS-CoV-2 RNA is generally  detectable in upper and lower respiratory sp ecimens during the acute  phase of infection. The expected result is Negative. Fact Sheet for Patients:  StrictlyIdeas.no Fact Sheet for Healthcare Providers: BankingDealers.co.za This test is not yet approved or cleared by the [place] FDA and has been authorized for detection and/or diagnosis of SARS-CoV-2 by FDA under an Emergency Use Authorization (EUA).  This EUA will remain in effect (meaning this test can be used) for the duration of the COVID-19 declaration under Section 564(b)(1) of the Act, 21 U.S.C. section 360bbb-3(b)(1), unless the authorization is terminated or revoked sooner. Performed at [facility], [place] [address] [place], [place] [address]      Labs: BNP (last 3 results) Recent Labs    08/04/18 1711  BNP 35.3   Basic Metabolic Panel: Recent Labs  Lab 08/04/18 1711 08/04/18 1721 08/04/18 2020 08/05/18 0335  NA 141 143 140 142  K 3.7 3.7 3.8 4.3  CL 101  --   --  100  CO2 36*  --   --  34*  GLUCOSE 95  --   --  252*  BUN 20  --   --  18  CREATININE 1.39*  --   --  1.43*  CALCIUM 9.4  --   --  9.0  MG 2.1  --   --   --    Liver Function Tests: Recent Labs  Lab 08/05/18 0335  AST 54*  ALT 36  ALKPHOS 45  BILITOT 0.4  PROT 8.2*  ALBUMIN 4.3   No results for input(s): LIPASE, AMYLASE in the last 168 hours. No results for input(s): AMMONIA in the last 168 hours. CBC: Recent Labs  Lab 08/04/18 1711 08/04/18 1721  08/04/18 2020 08/05/18 0335  WBC 4.9  --   --  5.2  NEUTROABS 2.3  --   --   --   HGB 10.9* 11.2* 10.9* 11.1*  HCT 34.0* 33.0* 32.0* 34.7*  MCV 88.3  --   --  88.7  PLT 293  --   --  283   Cardiac Enzymes: Recent Labs  Lab 08/04/18 1711  TROPONINI <0.03   BNP: Invalid input(s): POCBNP CBG: No results for input(s): GLUCAP in the last 168 hours. D-Dimer No results for input(s): DDIMER in the last 72 hours. Hgb A1c No results for input(s): HGBA1C in the last 72 hours. Lipid Profile No results for input(s): CHOL, HDL, LDLCALC, TRIG, CHOLHDL, LDLDIRECT in the last 72 hours. Thyroid function studies No results for input(s): TSH, T4TOTAL, T3FREE, THYROIDAB in the last 72 hours.  Invalid input(s): FREET3 Anemia work up No results for input(s): VITAMINB12, FOLATE, FERRITIN, TIBC, IRON, RETICCTPCT in the last 72 hours. Urinalysis No results found for: COLORURINE, APPEARANCEUR, [place], [place], [place], [place], [place], [place], PROTEINUR, UROBILINOGEN, NITRITE, LEUKOCYTESUR Sepsis Labs Invalid input(s): PROCALCITONIN,  WBC,  LACTICIDVEN Microbiology Recent Results (from the past 240 hour(s))  SARS Coronavirus 2 (CEPHEID - Performed in [place] hospital lab), Hosp Order     Status: None   Collection Time: 08/04/18  6:08 PM  Result Value Ref Range Status   SARS Coronavirus 2 NEGATIVE NEGATIVE Final    Comment: (NOTE) If result is NEGATIVE SARS-CoV-2 target nucleic acids are NOT DETECTED. The SARS-CoV-2 RNA is generally detectable in upper and lower  respiratory specimens during the acute phase of infection. The lowest  concentration of SARS-CoV-2 viral copies this assay can detect is 250  copies / mL. A negative result does not preclude SARS-CoV-2 infection  and should not be used as the sole basis for treatment or other  patient management decisions.  A negative result may occur with  improper specimen collection / handling, submission of specimen other  than  nasopharyngeal swab, presence of viral mutation(s) within the  areas targeted by this assay, and inadequate number of viral copies  (<250 copies / mL). A negative result must be combined with clinical  observations, patient history, and epidemiological information. If result is POSITIVE SARS-CoV-2 target nucleic acids are DETECTED. The SARS-CoV-2 RNA is generally detectable in upper and lower  respiratory specimens dur ing the acute phase of infection.  Positive  results are indicative of active infection with SARS-CoV-2.  Clinical  correlation with patient history and other diagnostic information is  necessary to determine patient infection status.  Positive results do  not rule out bacterial infection or co-infection with other viruses. If result is PRESUMPTIVE POSTIVE SARS-CoV-2 nucleic acids MAY BE PRESENT.   A presumptive positive result was obtained on the submitted specimen  and confirmed on repeat testing.  While 2019 novel coronavirus  (SARS-CoV-2) nucleic acids may be present in the submitted sample  additional confirmatory testing may be necessary for epidemiological  and / or clinical management purposes  to differentiate between  SARS-CoV-2 and other Sarbecovirus currently known to infect humans.  If clinically indicated additional testing with an alternate test  methodology [phone]) is advised.  The SARS-CoV-2 RNA is generally  detectable in upper and lower respiratory sp ecimens during the acute  phase of infection. The expected result is Negative. Fact Sheet for Patients:  StrictlyIdeas.no Fact Sheet for Healthcare Providers: BankingDealers.co.za This test is not yet approved or cleared by the [place] FDA and has been authorized for detection and/or diagnosis of SARS-CoV-2 by FDA under an Emergency Use Authorization (EUA).  This EUA will remain in effect (meaning this test can be used) for the duration of  the COVID-19 declaration under Section 564(b)(1) of the Act, 21 U.S.C. section 360bbb-3(b)(1), unless the authorization is terminated or revoked sooner. Performed at [facility], [place] [address] [place], [place] [address]      SIGNED:   Aline August, MD  [facility] 08/07/2018, 11:54 AM

## 2018-11-25 ENCOUNTER — Emergency Department (HOSPITAL_COMMUNITY)
Admission: EM | Admit: 2018-11-25 | Discharge: 2018-11-25 | Disposition: A | Discharge status: Home / Self Care | Payer: Self-pay | Attending: Emergency Medicine | Admitting: Emergency Medicine

## 2018-11-25 ENCOUNTER — Encounter (HOSPITAL_COMMUNITY): Payer: Self-pay | Admitting: Emergency Medicine

## 2018-11-25 ENCOUNTER — Other Ambulatory Visit: Payer: Self-pay

## 2018-11-25 ENCOUNTER — Emergency Department (HOSPITAL_COMMUNITY): Payer: Self-pay

## 2018-11-25 DIAGNOSIS — R0689 Other abnormalities of breathing: Secondary | ICD-10-CM | POA: Insufficient documentation

## 2018-11-25 DIAGNOSIS — J441 Chronic obstructive pulmonary disease with (acute) exacerbation: Secondary | ICD-10-CM | POA: Insufficient documentation

## 2018-11-25 DIAGNOSIS — Z79899 Other long term (current) drug therapy: Secondary | ICD-10-CM | POA: Insufficient documentation

## 2018-11-25 DIAGNOSIS — F17210 Nicotine dependence, cigarettes, uncomplicated: Secondary | ICD-10-CM | POA: Insufficient documentation

## 2018-11-25 LAB — POCT I-STAT EG7
Acid-Base Excess: 13 mmol/L — ABNORMAL HIGH (ref 0.0–2.0)
Bicarbonate: 44.2 mmol/L — ABNORMAL HIGH (ref 20.0–28.0)
Calcium, Ion: 1.2 mmol/L (ref 1.15–1.40)
HCT: 37 % (ref 36.0–46.0)
Hemoglobin: 12.6 g/dL (ref 12.0–15.0)
O2 Saturation: 87 %
Potassium: 3.8 mmol/L (ref 3.5–5.1)
Sodium: 142 mmol/L (ref 135–145)
TCO2: 47 mmol/L — ABNORMAL HIGH (ref 22–32)
pCO2, Ven: 94.9 mmHg (ref 44.0–60.0)
pH, Ven: 7.276 (ref 7.250–7.430)
pO2, Ven: 65 mmHg — ABNORMAL HIGH (ref 32.0–45.0)

## 2018-11-25 LAB — CBC WITH DIFFERENTIAL/PLATELET
Abs Immature Granulocytes: 0.02 10*3/uL (ref 0.00–0.07)
Basophils Absolute: 0 10*3/uL (ref 0.0–0.1)
Basophils Relative: 1 %
Eosinophils Absolute: 0 10*3/uL (ref 0.0–0.5)
Eosinophils Relative: 1 %
HCT: 35.6 % — ABNORMAL LOW (ref 36.0–46.0)
Hemoglobin: 11.5 g/dL — ABNORMAL LOW (ref 12.0–15.0)
Immature Granulocytes: 1 %
Lymphocytes Relative: 37 %
Lymphs Abs: 1.5 10*3/uL (ref 0.7–4.0)
MCH: 28.8 pg (ref 26.0–34.0)
MCHC: 32.3 g/dL (ref 30.0–36.0)
MCV: 89 fL (ref 80.0–100.0)
Monocytes Absolute: 0.3 10*3/uL (ref 0.1–1.0)
Monocytes Relative: 8 %
Neutro Abs: 2.2 10*3/uL (ref 1.7–7.7)
Neutrophils Relative %: 52 %
Platelets: 296 10*3/uL (ref 150–400)
RBC: 4 MIL/uL (ref 3.87–5.11)
RDW: 15 % (ref 11.5–15.5)
WBC: 4.2 10*3/uL (ref 4.0–10.5)
nRBC: 0 % (ref 0.0–0.2)

## 2018-11-25 LAB — BASIC METABOLIC PANEL
Anion gap: 11 (ref 5–15)
BUN: 11 mg/dL (ref 6–20)
CO2: 37 mmol/L — ABNORMAL HIGH (ref 22–32)
Calcium: 9.3 mg/dL (ref 8.9–10.3)
Chloride: 94 mmol/L — ABNORMAL LOW (ref 98–111)
Creatinine, Ser: 1.25 mg/dL — ABNORMAL HIGH (ref 0.44–1.00)
GFR calc Af Amer: 57 mL/min — ABNORMAL LOW (ref >60)
GFR calc non Af Amer: 49 mL/min — ABNORMAL LOW (ref >60)
Glucose, Bld: 92 mg/dL (ref 70–99)
Potassium: 4 mmol/L (ref 3.5–5.1)
Sodium: 142 mmol/L (ref 135–145)

## 2018-11-25 MED ORDER — ALBUTEROL SULFATE HFA 108 (90 BASE) MCG/ACT IN AERS
8.0000 | INHALATION_SPRAY | RESPIRATORY_TRACT | Status: DC | PRN
  Administered 2018-11-25: 8 via RESPIRATORY_TRACT
  Filled 2018-11-25 (×2): qty 6.7

## 2018-11-25 MED ORDER — PREDNISONE 20 MG PO TABS: 40.0000 mg | ORAL_TABLET | Freq: Every day | ORAL | 0 refills | Status: DC

## 2018-11-25 MED ORDER — METHYLPREDNISOLONE SODIUM SUCC 125 MG IJ SOLR
125.0000 mg | Freq: Once | INTRAMUSCULAR | Status: AC
  Administered 2018-11-25: 125 mg via INTRAVENOUS
  Filled 2018-11-25: qty 2

## 2018-11-25 NOTE — Discharge Instructions (Addendum)
Today your x-ray was normal and your blood work showed that you had too much carbon dioxide in your blood because your breathing is not good.  This is probably what is causing you to be so tired and no energy.  It is important that you use your oxygen at home and use your inhalers every 4-6 hours.  You are also given a prescription for steroids.  It would be very important for you to return if you are not feeling better or your breathing becomes worse.  It is also a good idea to stop smoking as this is only making your breathing worse.  You were given a printout of medical resources in the area for you to get a regular doctor as this will be very helpful to keep your breathing issues under control.

## 2018-11-25 NOTE — ED Notes (Signed)
Patient verbalized understanding of discharge instructions. Opportunity for questions were provided. Pt. ambulatory and discharged home.  

## 2018-11-25 NOTE — ED Triage Notes (Signed)
Pt to ED via [facility] with c/o shortness of breath.   Pt st's had thyroid removed 1 yr ago and has had wheezing since then but today shortness of breath became worse.

## 2018-11-25 NOTE — ED Provider Notes (Signed)
[facility] EMERGENCY DEPARTMENT Provider Note   CSN: [id number] Arrival date & time: 11/25/18  1720     History   Chief Complaint Chief Complaint  Patient presents with   Shortness of Breath    HPI Meghan Contreras is a 53 y.o. female.     Patient is a 53 year old female presenting today because she states that her family said she was having trouble breathing.  Patient says she got oxygen off the Internet and has been wearing oxygen and is been using her inhaler at home.  She continues to smoke but does not wear the oxygen while smoking.  Patient denies any flulike illness, fever, productive cough but has had a mild runny nose.  She states her family made her come today because her breathing was worse.  Family did arrive and states that she has been very tired with no energy for around a week.  Patient is adamant that she does not want [place] to do anything for her because she does not want to be at the hospital and she is scared to be here.  Patient was hospitalized in June at that time was found to have a COPD exacerbation with hypercarbia resulting in BiPAP.  Patient left AGAINST MEDICAL ADVICE after a few days before full treatment of her COPD.  Patient does not have a PCP and is not being followed by anyone.  The history is provided by the patient.  Shortness of Breath Severity:  Moderate Onset quality:  Gradual Timing:  Constant Progression:  Worsening Chronicity:  Recurrent Context comment:  Boyfriend reports that she has been very sleepy with no energy for several days to a week now and her breathing seems to be getting worse Relieved by:  None tried Worsened by:  Activity Ineffective treatments:  Oxygen and inhaler Associated symptoms: wheezing   Associated symptoms: no abdominal pain, no chest pain, no cough, no fever, no headaches, no hemoptysis, no neck pain and no sputum production   Associated symptoms comment:  Mild runny nose Risk factors comment:   History of hyperthyroidism status post thyroidectomy approximately 1 year ago 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) 08/04/2018   COPD with acute exacerbation ([place]) 08/04/2018   Hyperthyroidism Q000111Q   Acute metabolic encephalopathy Q000111Q    Past Surgical History:  Procedure Laterality Date   THYROIDECTOMY  2019     OB History   No obstetric history on file.      Home Medications    Prior to Admission medications   Medication Sig Start Date End Date Taking? Authorizing Provider  buprenorphine-naloxone (SUBOXONE) 8-2 mg SUBL SL tablet Place 1 tablet under the tongue 2 (two) times daily.    [provider]  chlordiazePOXIDE (LIBRIUM) 25 MG capsule Take 1 capsule (25 mg total) by mouth 3 (three) times daily as needed for withdrawal. Patient not taking: Reported on 08/05/2018 11/04/15   Everlene Balls, MD  docusate sodium (COLACE) 100 MG capsule Take 100 mg by mouth daily.    [provider]  ferrous sulfate 325 (65 FE) MG tablet Take  325 mg by mouth daily.    [provider]  levothyroxine (SYNTHROID) 75 MCG tablet Take 75 mcg by mouth daily.    [provider]  metFORMIN (GLUCOPHAGE-XR) 500 MG 24 hr tablet Take 500 mg by mouth daily.    [provider]  predniSONE (DELTASONE) 20 MG tablet Take 2 tablets (40 mg total) by mouth daily. 11/25/18   Blanchie Dessert, MD  QUEtiapine (SEROQUEL) 100 MG tablet Take 100 mg by mouth daily.    [provider]  QUEtiapine (SEROQUEL) 300 MG tablet Take 300 mg by mouth at bedtime.    [provider]     Family History No family history on file.  Social History Social History   Tobacco Use   Smoking status: Current Every Day Smoker   Smokeless tobacco: Never Used  Substance Use Topics   Alcohol use: No   Drug use: Yes    Comment: Benzodiazepines / Klonopin addiction      Allergies   Amoxicillin   Review of Systems Review of Systems  Constitutional: Negative for fever.  Respiratory: Positive for shortness of breath and wheezing. Negative for cough, hemoptysis and sputum production.   Cardiovascular: Negative for chest pain.  Gastrointestinal: Negative for abdominal pain.  Musculoskeletal: Negative for neck pain.  Neurological: Negative for headaches.  All other systems reviewed and are negative.    Physical Exam Updated Vital Signs BP (!) 125/94    Pulse 80    Temp 98 F (36.7 C) (Oral)    Resp 16    Ht 5\' 2"  (1.575 m)    Wt 72.6 kg    SpO2 (!) 88%    BMI 29.26 kg/m   Physical Exam Vitals signs and nursing note reviewed.  Constitutional:      General: She is not in acute distress.    Appearance: She is well-developed and normal weight.  HENT:     Head: Normocephalic and atraumatic.     Mouth/Throat:     Mouth: Mucous membranes are moist.  Eyes:     Conjunctiva/sclera: Conjunctivae normal.     Pupils: Pupils are equal, round, and reactive to light.  Neck:     Musculoskeletal: Normal range of motion and neck supple.  Cardiovascular:     Rate and Rhythm: Normal rate and regular rhythm.     Heart sounds: No murmur.  Pulmonary:     Effort: Pulmonary effort is normal. No tachypnea or respiratory distress.     Breath sounds: Stridor present. Decreased breath sounds and wheezing present. No rales.     Comments: Mild inspiratory stridor but pt able to speak in full sentences Abdominal:     General: There is no distension.     Palpations: Abdomen is soft.     Tenderness: There is no abdominal tenderness. There is no guarding or rebound.  Musculoskeletal:  Normal range of motion.        General: No tenderness.     Right lower leg: She exhibits no tenderness. No edema.     Left lower leg: She exhibits no tenderness. No edema.  Skin:    General: Skin is warm and dry.     Findings: No erythema or rash.  Neurological:     General: No focal deficit present.     Mental Status: She is alert and oriented to person, place, and time.  Psychiatric:        Behavior: Behavior normal.      ED Treatments / Results  Labs (  all labs ordered are listed, but only abnormal results are displayed) Labs Reviewed  CBC WITH DIFFERENTIAL/PLATELET - Abnormal; Notable for the following components:      Result Value   Hemoglobin 11.5 (*)    HCT 35.6 (*)    All other components within normal limits  BASIC METABOLIC PANEL - Abnormal; Notable for the following components:   Chloride 94 (*)    CO2 37 (*)    Creatinine, Ser 1.25 (*)    GFR calc non Af Amer 49 (*)    GFR calc Af Amer 57 (*)    All other components within normal limits  POCT I-STAT EG7 - Abnormal; Notable for the following components:   pCO2, Ven 94.9 (*)    pO2, Ven 65.0 (*)    Bicarbonate 44.2 (*)    TCO2 47 (*)    Acid-Base Excess 13.0 (*)    All other components within normal limits  I-STAT VENOUS BLOOD GAS, ED    EKG EKG Interpretation  Date/Time:  Saturday November 25 2018 17:31:18 EDT Ventricular Rate:  71 PR Interval:  174 QRS Duration: 96 QT Interval:  380 QTC Calculation: 412 R Axis:   -76 Text Interpretation:  Normal sinus rhythm Left axis deviation Incomplete right bundle branch block No significant change since last tracing Confirmed by Blanchie Dessert [id number]) on 11/25/2018 5:37:46 PM   Radiology Dg Chest Port 1 View  Result Date: 11/25/2018 CLINICAL DATA:  Shortness of breath EXAM: PORTABLE CHEST 1 VIEW COMPARISON:  August 04, 2018 FINDINGS: Stable cardiomegaly. The hila and mediastinum are unchanged. No pneumothorax. Bibasilar opacities are stable, possibly  atelectasis. IMPRESSION: Bibasilar opacities are stable, possibly atelectasis. No other interval changes. Electronically Signed   By: Dorise Bullion III M.D   On: 11/25/2018 18:34    Procedures Procedures (including critical care time)  Medications Ordered in ED Medications  albuterol (VENTOLIN HFA) 108 (90 Base) MCG/ACT inhaler 8 puff (8 puffs Inhalation Given 11/25/18 1801)  methylPREDNISolone sodium succinate (SOLU-MEDROL) 125 mg/2 mL injection 125 mg (125 mg Intravenous Given 11/25/18 2007)     Initial Impression / Assessment and Plan / ED Course  I have reviewed the triage vital signs and the nursing notes.  Pertinent labs & imaging results that were available during my care of the patient were reviewed by me and considered in my medical decision making (see chart for details).       Patient is a 53 year old female presenting today because of worsening shortness of breath and fatigue and no energy patient was hospitalized in June due to hypercarbia requiring BiPAP related to altered mental status.  Patient does have a history of polysubstance abuse but states she has not used any cocaine, opiates for 1 year.  She continues to take Suboxone but does still occasionally take benzodiazepines for her anxiety.   Patient here has decreased breath sounds throughout.  She does have some inspiratory stridor which is most likely related to her thyroid surgery and a paralyzed vocal cord.  Patient is able to speak in full sentences and with 2 L of oxygen O2 sats are 95%.  Patient is very tearful and states she does not really want to be at the hospital.  She says she is very scared of hospitals and does not want [place] to do anything for her.  Did convince the patient to get some albuterol and a chest x-ray.  The chest x-ray shows no acute findings, VBG does show hypercarbia with a CO2 of 94.  pH is within normal limits and there is metabolic compensation.  However this is most likely the cause for her being  exceedingly tired.  CBC without acute changes and CMP within normal limits.  Patient initially was refusing steroids and states she just wants to leave.  Her boyfriend did, and stated that she just needed to get some help because she has not been doing well at home.  Patient says she is not staying but did agree to get a shot of Solu-Medrol.  Patient was also given a prescription for some prednisone and given a new inhaler.  I stressed to the patient the importance of staying for treatment and helping with her COPD exacerbation but she refuses to stay.  She seems to understand the possibility that things could get worse and it could lead to her death or altered mental status resulting in emergent return to the emergency room but she says she is not staying.  Patient's boyfriend is also present and witnesses the entire conversation.  He does stay with her and if anything worsens he will bring her back.  Final Clinical Impressions(s) / ED Diagnoses   Final diagnoses:  COPD exacerbation ([place])  Hypercarbia    ED Discharge Orders         Ordered    predniSONE (DELTASONE) 20 MG tablet  Daily     11/25/18 2006           Fatisha Rabalais, MD 11/25/18 2020

## 2018-11-26 ENCOUNTER — Telehealth: Payer: Self-pay | Admitting: *Deleted

## 2018-11-28 ENCOUNTER — Ambulatory Visit: Payer: Self-pay | Admitting: Internal Medicine

## 2018-11-28 ENCOUNTER — Encounter: Payer: Self-pay | Admitting: Internal Medicine

## 2018-11-28 ENCOUNTER — Other Ambulatory Visit: Payer: Self-pay

## 2018-11-28 VITALS — BP 138/88 | HR 88 | Resp 14 | Ht 62.0 in | Wt 158.0 lb

## 2018-11-28 DIAGNOSIS — E039 Hypothyroidism, unspecified: Secondary | ICD-10-CM

## 2018-11-28 DIAGNOSIS — F32A Depression, unspecified: Secondary | ICD-10-CM

## 2018-11-28 DIAGNOSIS — Z716 Tobacco abuse counseling: Secondary | ICD-10-CM

## 2018-11-28 DIAGNOSIS — F329 Major depressive disorder, single episode, unspecified: Secondary | ICD-10-CM

## 2018-11-28 DIAGNOSIS — Z720 Tobacco use: Secondary | ICD-10-CM

## 2018-11-28 DIAGNOSIS — R061 Stridor: Secondary | ICD-10-CM

## 2018-11-28 DIAGNOSIS — F4310 Post-traumatic stress disorder, unspecified: Secondary | ICD-10-CM

## 2018-11-28 DIAGNOSIS — R7989 Other specified abnormal findings of blood chemistry: Secondary | ICD-10-CM

## 2018-11-28 MED ORDER — NICOTINE 7 MG/24HR TD PT24: 7.0000 mg | MEDICATED_PATCH | Freq: Every day | TRANSDERMAL | 0 refills | Status: DC

## 2018-11-28 MED ORDER — NICOTINE 21 MG/24HR TD PT24: 21.0000 mg | MEDICATED_PATCH | Freq: Every day | TRANSDERMAL | 0 refills | Status: DC

## 2018-11-28 MED ORDER — LEVOTHYROXINE SODIUM 25 MCG PO TABS: ORAL_TABLET | ORAL | 11 refills | Status: DC

## 2018-11-28 MED ORDER — NICOTINE 14 MG/24HR TD PT24: 14.0000 mg | MEDICATED_PATCH | Freq: Every day | TRANSDERMAL | 0 refills | Status: DC

## 2018-11-28 NOTE — Progress Notes (Addendum)
Subjective:    Patient ID: Meghan Contreras, female   DOB: [birth year], 53 y.o.   MRN: [id number]   HPI   Here to establish. Here with her female friend, Montine Circle. Originally from [place], [place].  Both more recently from [place].  1.  Thyroidectomy in October 2019 for large goiter and airway compromise in [facility] in [place], [place].  Has not had thyroid hormone for 6 months, maybe more.    Not clear by her history if her voice was hoarse as it is now after surgery.  Sounds like worsened as she got away from using her thyroid hormone.  2.  Glottal breathing:  She breaths through her mouth.   Smokes half a pack a day.   Has never really tried to quit smoking.   Carries the diagnosis of COPD with CO2 retention recently.  Purchased her own concentrated O2 for home use. She does not sleep in her bed as she is afraid she will not awaken.  Falls asleep on sofa and slumps forward, worsening her upper airway obstruction. Seen in ED on the 24th and started on prednisone.  3.  Tobacco abuse: smokes 10 cigs daily.  Smoked since age 77 yo.  CXR does not necessarily support COPD, but does retain CO2.  Has used nicotine patches, but has not for some time.  4.   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  Has been on Suboxone.  Sounds like with the clinic with [facility].  Goes once weekly for appts there.  ?[facility].  She cannot remember the name.  5.  DM:  Sounds like lost 40 lb in past year and sugars down to normal.  Was told "resolved."  6.  Depression, PTSD.  States this is better since staying in [place]   Current Meds  Medication Sig  . buprenorphine-naloxone (SUBOXONE) 8-2 mg SUBL SL tablet Place 1 tablet under the tongue 2 (two) times daily.  . predniSONE (DELTASONE) 20 MG tablet Take 2 tablets (40 mg total) by mouth daily.   Allergies  Allergen Reactions  . Amoxicillin Swelling   Past Medical History:  Diagnosis Date  . Anxiety   .  Depression   . HOH (hard of hearing)   . Hyperthyroidism   . PTSD (post-traumatic stress disorder)    Past Surgical History:  Procedure Laterality Date  . THYROIDECTOMY  2019   No family history on file.  Social History   Socioeconomic History  . Marital status: Legally Separated    Spouse name: Not on file  . Number of children: Not on file  . Years of education: Not on file  . Highest education level: Not on file  Occupational History  . Not on file  Social Needs  . Financial resource strain: Not on file  . Food insecurity    Worry: Not on file    Inability: Not on file  . Transportation needs    Medical: Not on file    Non-medical: Not on file  Tobacco Use  . Smoking status: Current Every Day Smoker    Packs/day: 0.50    Years: 42.00    Pack years: 21.00  . Smokeless tobacco: Never Used  Substance and Sexual Activity  . Alcohol use: No  . Drug use: Yes    Comment: Benzodiazepines / Klonopin addiction   . Sexual activity: Not on file  Lifestyle  . Physical activity    Days per week: Not on file  Minutes per session: Not on file  . Stress: Not on file  Relationships  . Social Herbalist on phone: Not on file    Gets together: Not on file    Attends religious service: Not on file    Active member of club or organization: Not on file    Attends meetings of clubs or organizations: Not on file    Relationship status: Not on file  . Intimate partner violence    Fear of current or ex partner: Not on file    Emotionally abused: Not on file    Physically abused: Not on file    Forced sexual activity: Not on file  Other Topics Concern  . Not on file  Social History Narrative  . Not on file      Review of Systems    Objective:   BP 138/88 (BP Location: Left Arm, Patient Position: Sitting, Cuff Size: Normal)   Pulse 88   Resp 14   Ht 5\' 2"  (1.575 m)   Wt 158 lb (71.7 kg)   BMI 28.90 kg/m   Physical Exam  NAD HEENT:  Thickened facial  skin folds, thickened tongue. Voice hoarse and gravelly.  Glottic inspiration goes away when not focusing on breathing.  The noise resolves completely with nasal breathing. PERRL, EOMI, TMs pearly gray, throat without injection. Neck:  Supple, No adenopathy, no thyromegaly Chest:  Upper airwary noise until breathes through nose.  CTA CV:  RRR with norrmal S1 and S2, No S3, S4 or murmur.  Radial and DP pulses normal and equal Abd:  S, NT, No HSM or mass, + BS LE:  Moderate pitting edema bilaterally Skin:  dry  Assessment & Plan  1.  Hypothyroidism:  Appears chronically and significantly hypothyroid. Gradually titrate to 75 mcg of levothyroxine, her previous dose. TSH baseline TSH in 6 weeks. Will see how much of her abnormal breathing resolves with adequate replacement before further evaluation. Encouraged her to breathe through nose. Flu vaccine Oct 15 and consider pneumococcal if not in old records. Did find old records from [facility] with CT of soft tissue of chest and neck 11/2017:  No findings to suggest cause of stridorous breathing and no obvious lung disease.   PFTs 12/2017 suggested variable extrathoracic obstruction.  Appears she was to have direct visualization of vocal cords, but not clear that occurred. Repeat CT of neck in 07/2018 without findings as well.  2.  Depression/PTSD:  Not clear she is in need of antidepressant currently.  Again, will see how she is doing with adequate thyroid replacement.  3.  Tobacco abuse and recent ED visit with CO2 retention. Willing to consider nicotine patches. Discussed how to use at length.  4.  Elevated Creatinine:  Noted with recent ED visit:  BmP

## 2018-11-29 LAB — BASIC METABOLIC PANEL
BUN/Creatinine Ratio: 10 (ref 9–23)
BUN: 14 mg/dL (ref 6–24)
CO2: 33 mmol/L — ABNORMAL HIGH (ref 20–29)
Calcium: 9.6 mg/dL (ref 8.7–10.2)
Chloride: 95 mmol/L — ABNORMAL LOW (ref 96–106)
Creatinine, Ser: 1.38 mg/dL — ABNORMAL HIGH (ref 0.57–1.00)
GFR calc Af Amer: 51 mL/min/{1.73_m2} — ABNORMAL LOW (ref >59)
GFR calc non Af Amer: 44 mL/min/{1.73_m2} — ABNORMAL LOW (ref >59)
Glucose: 103 mg/dL — ABNORMAL HIGH (ref 65–99)
Potassium: 5.4 mmol/L — ABNORMAL HIGH (ref 3.5–5.2)
Sodium: 142 mmol/L (ref 134–144)

## 2018-11-29 LAB — TSH: TSH: 60 u[IU]/mL — ABNORMAL HIGH (ref 0.450–4.500)

## 2018-12-10 ENCOUNTER — Encounter: Payer: Self-pay | Admitting: Internal Medicine

## 2018-12-10 DIAGNOSIS — R061 Stridor: Secondary | ICD-10-CM | POA: Insufficient documentation

## 2018-12-13 ENCOUNTER — Ambulatory Visit: Payer: Self-pay | Admitting: Internal Medicine

## 2018-12-13 ENCOUNTER — Other Ambulatory Visit: Payer: Self-pay

## 2018-12-13 ENCOUNTER — Encounter: Payer: Self-pay | Admitting: Internal Medicine

## 2018-12-13 VITALS — BP 128/88 | HR 90 | Temp 97.3°F | Resp 12 | Ht 62.0 in | Wt 155.0 lb

## 2018-12-13 DIAGNOSIS — E039 Hypothyroidism, unspecified: Secondary | ICD-10-CM

## 2018-12-13 DIAGNOSIS — F32A Depression, unspecified: Secondary | ICD-10-CM

## 2018-12-13 DIAGNOSIS — Z720 Tobacco use: Secondary | ICD-10-CM

## 2018-12-13 DIAGNOSIS — Z716 Tobacco abuse counseling: Secondary | ICD-10-CM

## 2018-12-13 DIAGNOSIS — F329 Major depressive disorder, single episode, unspecified: Secondary | ICD-10-CM

## 2018-12-13 DIAGNOSIS — F4310 Post-traumatic stress disorder, unspecified: Secondary | ICD-10-CM

## 2018-12-13 DIAGNOSIS — R061 Stridor: Secondary | ICD-10-CM

## 2018-12-13 NOTE — Patient Instructions (Signed)
[place], [place] [address] [phone]  Increase Levothyroxine to 75 mcg next Monday

## 2018-12-13 NOTE — Progress Notes (Signed)
    Subjective:    Patient ID: Meghan Contreras, female   DOB: [birth year], 53 y.o.   MRN: [id number]   HPI   1.  Hypothyroidism:  Is now taking 50 mcg daily starting this past Monday.  Her female friend states she is generally with improved energy.    Current Meds  Medication Sig  . buprenorphine-naloxone (SUBOXONE) 8-2 mg SUBL SL tablet Place 1 tablet under the tongue 2 (two) times daily.  Marland Kitchen levothyroxine (SYNTHROID) 25 MCG tablet 1 tab by mouth daily 1/2 hour before breakfast or other meds, increase to 2 tabs daily in 2 weeks and 3 tabs daily in 4 weeks.   Allergies  Allergen Reactions  . Amoxicillin Swelling     Review of Systems    Objective:   BP 128/88 (BP Location: Left Arm, Patient Position: Sitting, Cuff Size: Normal)   Pulse 90   Temp (!) 97.3 F (36.3 C) (Temporal)   Resp 12   Ht 5\' 2"  (1.575 m)   Wt 155 lb (70.3 kg)   BMI 28.35 kg/m   Physical Exam  Still appears with thickened skin folds of face.   Glottal/stridorous breathing Lungs:  Noise from upper airway throughout.  CTA when stops. CV:  RRR without murmur or rub.  Radial pulses normal and equal   Assessment & Plan  1.  HM:  Refuses flu and pneumococcal vaccine  2. Mental Health:  Encouraged seeking care at [facility]  3.  Hypothyroidism:  Increase Levothyroxine to 75 mcg next Monday if tolerating 50 well. TSH in 4 weeks.  4.  Glottal breathing:  Call speech path or pulmonary regarding glottic breathing. Has been evaluated before without findings. Consider ENT  5.  Tobacco abuse: Not interested in quitting.

## 2019-01-09 ENCOUNTER — Other Ambulatory Visit (INDEPENDENT_AMBULATORY_CARE_PROVIDER_SITE_OTHER): Payer: Self-pay

## 2019-01-09 ENCOUNTER — Other Ambulatory Visit: Payer: Self-pay

## 2019-01-09 DIAGNOSIS — E039 Hypothyroidism, unspecified: Secondary | ICD-10-CM

## 2019-01-10 LAB — TSH: TSH: 33.5 u[IU]/mL — ABNORMAL HIGH (ref 0.450–4.500)

## 2019-01-24 MED ORDER — LEVOTHYROXINE SODIUM 100 MCG PO TABS: ORAL_TABLET | ORAL | 11 refills | Status: DC

## 2019-01-24 NOTE — Addendum Note (Signed)
Addended by: Marcelino Duster on: 01/24/2019 03:11 PM   Modules accepted: Orders

## 2019-02-11 ENCOUNTER — Telehealth: Payer: Self-pay | Admitting: Internal Medicine

## 2019-02-11 DIAGNOSIS — Z720 Tobacco use: Secondary | ICD-10-CM | POA: Insufficient documentation

## 2019-02-11 DIAGNOSIS — F4310 Post-traumatic stress disorder, unspecified: Secondary | ICD-10-CM | POA: Insufficient documentation

## 2019-02-11 DIAGNOSIS — E039 Hypothyroidism, unspecified: Secondary | ICD-10-CM | POA: Insufficient documentation

## 2019-02-11 DIAGNOSIS — F32A Depression, unspecified: Secondary | ICD-10-CM | POA: Insufficient documentation

## 2019-02-11 DIAGNOSIS — F329 Major depressive disorder, single episode, unspecified: Secondary | ICD-10-CM | POA: Insufficient documentation

## 2019-02-12 NOTE — Telephone Encounter (Signed)
Meghan Contreras states she did not receive a call from our office regarding increase to 100 mcg.  Pt. states she picked up her new Rx of 100 mcg and has been taking it for about a week..   Per Dr. Amil Amen Pt. To continue taking it.. Need to reschedule appointment for Wed. The 16th and move it 3 weeks out ..with TSH lab as well

## 2019-02-13 NOTE — Telephone Encounter (Signed)
Patient informed. Appointment moved to Tuesday, Jan.5 at 2:00pm

## 2019-02-14 ENCOUNTER — Ambulatory Visit: Payer: Self-pay | Admitting: Internal Medicine

## 2019-03-02 DIAGNOSIS — E785 Hyperlipidemia, unspecified: Secondary | ICD-10-CM

## 2019-03-02 HISTORY — DX: Hyperlipidemia, unspecified: E78.5

## 2019-03-06 ENCOUNTER — Ambulatory Visit: Payer: Self-pay | Admitting: Internal Medicine

## 2019-07-16 ENCOUNTER — Telehealth: Payer: Self-pay | Admitting: Internal Medicine

## 2019-07-16 NOTE — Telephone Encounter (Signed)
Meghan Contreras called requesting Rx on QUEtiapine (SEROQUEL) 100 MG tablet and QUEtiapine (SEROQUEL) 300 MG tablet.

## 2019-07-18 NOTE — Telephone Encounter (Signed)
Please notify patient she needs to be seen. Inform her we will add her tot the waiting list and if we get any cancellations we will call her to come in before 08/15/19

## 2019-07-19 ENCOUNTER — Emergency Department (HOSPITAL_COMMUNITY)
Admission: EM | Admit: 2019-07-19 | Discharge: 2019-07-20 | Disposition: A | Discharge status: Home / Self Care | Payer: Self-pay | Attending: Emergency Medicine | Admitting: Emergency Medicine

## 2019-07-19 ENCOUNTER — Other Ambulatory Visit: Payer: Self-pay

## 2019-07-19 DIAGNOSIS — Z79899 Other long term (current) drug therapy: Secondary | ICD-10-CM | POA: Insufficient documentation

## 2019-07-19 DIAGNOSIS — Z760 Encounter for issue of repeat prescription: Secondary | ICD-10-CM | POA: Insufficient documentation

## 2019-07-19 DIAGNOSIS — F4310 Post-traumatic stress disorder, unspecified: Secondary | ICD-10-CM | POA: Insufficient documentation

## 2019-07-19 DIAGNOSIS — F17210 Nicotine dependence, cigarettes, uncomplicated: Secondary | ICD-10-CM | POA: Insufficient documentation

## 2019-07-19 DIAGNOSIS — F419 Anxiety disorder, unspecified: Secondary | ICD-10-CM | POA: Insufficient documentation

## 2019-07-19 DIAGNOSIS — Z91040 Latex allergy status: Secondary | ICD-10-CM | POA: Insufficient documentation

## 2019-07-19 LAB — COMPREHENSIVE METABOLIC PANEL
ALT: 12 U/L (ref 0–44)
AST: 16 U/L (ref 15–41)
Albumin: 4 g/dL (ref 3.5–5.0)
Alkaline Phosphatase: 52 U/L (ref 38–126)
Anion gap: 8 (ref 5–15)
BUN: 21 mg/dL — ABNORMAL HIGH (ref 6–20)
CO2: 28 mmol/L (ref 22–32)
Calcium: 9.6 mg/dL (ref 8.9–10.3)
Chloride: 105 mmol/L (ref 98–111)
Creatinine, Ser: 0.97 mg/dL (ref 0.44–1.00)
GFR calc Af Amer: >60 mL/min (ref >60)
GFR calc non Af Amer: >60 mL/min (ref >60)
Glucose, Bld: 97 mg/dL (ref 70–99)
Potassium: 4.2 mmol/L (ref 3.5–5.1)
Sodium: 141 mmol/L (ref 135–145)
Total Bilirubin: 0.7 mg/dL (ref 0.3–1.2)
Total Protein: 8 g/dL (ref 6.5–8.1)

## 2019-07-19 LAB — CBC
HCT: 36.4 % (ref 36.0–46.0)
Hemoglobin: 11.7 g/dL — ABNORMAL LOW (ref 12.0–15.0)
MCH: 27 pg (ref 26.0–34.0)
MCHC: 32.1 g/dL (ref 30.0–36.0)
MCV: 84.1 fL (ref 80.0–100.0)
Platelets: 348 10*3/uL (ref 150–400)
RBC: 4.33 MIL/uL (ref 3.87–5.11)
RDW: 13.3 % (ref 11.5–15.5)
WBC: 7.6 10*3/uL (ref 4.0–10.5)
nRBC: 0 % (ref 0.0–0.2)

## 2019-07-19 LAB — ACETAMINOPHEN LEVEL: Acetaminophen (Tylenol), Serum: <10 ug/mL — ABNORMAL LOW (ref 10–30)

## 2019-07-19 LAB — RAPID URINE DRUG SCREEN, HOSP PERFORMED
Amphetamines: POSITIVE — AB
Barbiturates: NOT DETECTED
Benzodiazepines: NOT DETECTED
Cocaine: NOT DETECTED
Opiates: NOT DETECTED
Tetrahydrocannabinol: NOT DETECTED

## 2019-07-19 LAB — SALICYLATE LEVEL: Salicylate Lvl: <7 mg/dL — ABNORMAL LOW (ref 7.0–30.0)

## 2019-07-19 LAB — ETHANOL: Alcohol, Ethyl (B): <10 mg/dL (ref <10)

## 2019-07-19 MED ORDER — QUETIAPINE FUMARATE 300 MG PO TABS: 300.0000 mg | ORAL_TABLET | Freq: Every day | ORAL | 0 refills | Status: DC

## 2019-07-19 MED ORDER — PRAZOSIN HCL 2 MG PO CAPS: 2.0000 mg | ORAL_CAPSULE | Freq: Every day | ORAL | 0 refills | Status: DC

## 2019-07-19 NOTE — ED Provider Notes (Signed)
[facility] EMERGENCY DEPARTMENT Provider Note   CSN: [id number] Arrival date & time: 07/19/19  1722     History Chief Complaint  Patient presents with  . Medication Refill  . Post-Traumatic Stress Disorder    Meghan Contreras is a 54 y.o. female.  HPI Patient is a 54 year old female who presents for worsening symptoms of nightmares, anxiety, and sleep disturbance after running out of her home nighttime medications: Seroquel and prazosin.  She states that she has been taking these medications for years.  She previously saw her primary care doctor in [place].  She moved to [place] a year ago, but has been using her previous prescriptions for refills.  Currently, she ran out of refills.  Her last doses were 4 days ago.  She called her PCP for new prescriptions, but her PCP told her that she would have to be seen in person first.  She set up an in person appointment, but the earliest she will be able to be seen is June 16 (4 weeks from now).  Patient denies any homicidal or suicidal ideation.  She is currently off of illicit drugs, and takes Suboxone.  She denies any physical complaints.    Past Medical History:  Diagnosis Date  . Anxiety   . Depression   . HOH (hard of hearing)   . Hyperthyroidism   . PTSD (post-traumatic stress disorder)   . Stridor 2019   Developed after total thyroidectomy.  Old records from [facility] with CT of soft tissue of chest and neck 11/2017:  No findings to suggest cause of stridorous breathing and no obvious lung disease.      Patient Active Problem List   Diagnosis Date Noted  . Hypothyroidism 02/11/2019  . Tobacco abuse 02/11/2019  . Depression 02/11/2019  . PTSD (post-traumatic stress disorder) 02/11/2019  . Stridor   . Acute on chronic respiratory failure with hypoxemia ([place]) 08/04/2018  . COPD with acute exacerbation ([place]) 08/04/2018  . Hyperthyroidism 08/04/2018  . Acute metabolic encephalopathy  [id number]    Past Surgical History:  Procedure Laterality Date  . THYROIDECTOMY  2019     OB History   No obstetric history on file.     No family history on file.  Social History   Tobacco Use  . Smoking status: Current Every Day Smoker    Packs/day: 0.50    Years: 42.00    Pack years: 21.00  . Smokeless tobacco: Never Used  Substance Use Topics  . Alcohol use: No  . Drug use: Yes    Comment: Benzodiazepines / Klonopin addiction     Home Medications Prior to Admission medications   Medication Sig Start Date End Date Taking? Authorizing Provider  buprenorphine-naloxone (SUBOXONE) 8-2 mg SUBL SL tablet Place 1 tablet under the tongue 2 (two) times daily.    [provider]  docusate sodium (COLACE) 100 MG capsule Take 100 mg by mouth daily.    [provider]  ferrous sulfate 325 (65 FE) MG tablet Take 325 mg by mouth daily.    [provider]  levothyroxine (SYNTHROID) 100 MCG tablet 1 tab by mouth daily 1/2 hour before breakfast or other meds 01/24/19   Mack Hook, MD  metFORMIN (GLUCOPHAGE-XR) 500 MG 24 hr tablet Take 500 mg by mouth daily.    [provider]  nicotine (NICODERM CQ - DOSED IN MG/24 HOURS) 14 mg/24hr patch Place 1 patch (14 mg total) onto the skin daily. Change patch to skin  daily for 14 days after completion of 21 mg patches Patient not taking: Reported on 12/13/2018 11/28/18   Mack Hook, MD  nicotine (NICODERM CQ - DOSED IN MG/24 HOURS) 21 mg/24hr patch Place 1 patch (21 mg total) onto the skin daily. Change one patch to skin daily and reduce to 14 mg patches after 28 days. Patient not taking: Reported on 12/13/2018 11/28/18   Mack Hook, MD  nicotine (NICODERM CQ - DOSED IN MG/24 HR) 7 mg/24hr patch Place 1 patch (7 mg total) onto the skin daily. Change patch to skin daily for 14 days after completion of 14 mg patches Patient not taking: Reported on 12/13/2018 11/28/18   Mack Hook,  MD  prazosin (MINIPRESS) 2 MG capsule Take 1 capsule (2 mg total) by mouth at bedtime. 07/19/19 08/18/19  Godfrey Pick, MD  predniSONE (DELTASONE) 20 MG tablet Take 2 tablets (40 mg total) by mouth daily. Patient not taking: Reported on 12/13/2018 11/25/18   Blanchie Dessert, MD  QUEtiapine (SEROQUEL) 100 MG tablet Take 100 mg by mouth daily.    [provider]  QUEtiapine (SEROQUEL) 300 MG tablet Take 1 tablet (300 mg total) by mouth at bedtime. 07/19/19   Godfrey Pick, MD    Allergies    Amoxicillin and Latex  Review of Systems   Review of Systems  Constitutional: Negative for chills and fever.  HENT: Negative for ear pain and sore throat.   Eyes: Negative for pain and visual disturbance.  Respiratory: Negative for cough and shortness of breath.   Cardiovascular: Negative for chest pain and palpitations.  Gastrointestinal: Negative for abdominal pain and vomiting.  Genitourinary: Negative for dysuria and hematuria.  Musculoskeletal: Negative for arthralgias and back pain.  Skin: Negative for color change and rash.  Neurological: Negative for seizures and syncope.  Psychiatric/Behavioral: Positive for sleep disturbance. Negative for agitation, behavioral problems, confusion, decreased concentration, hallucinations, self-injury and suicidal ideas. The patient is nervous/anxious. The patient is not hyperactive.   All other systems reviewed and are negative.   Physical Exam Updated Vital Signs BP (!) 134/96 (BP Location: Right Arm)   Pulse 67   Temp 98 F (36.7 C) (Oral)   Resp 16   Ht 5\' 3"  (1.6 m)   Wt 72.6 kg   SpO2 98%   BMI 28.34 kg/m   Physical Exam Vitals and nursing note reviewed.  Constitutional:      General: She is not in acute distress.    Appearance: She is well-developed.  HENT:     Head: Normocephalic and atraumatic.  Eyes:     Conjunctiva/sclera: Conjunctivae normal.  Cardiovascular:     Rate and Rhythm: Normal rate and regular rhythm.     Heart  sounds: No murmur.  Pulmonary:     Effort: Pulmonary effort is normal. No respiratory distress.     Breath sounds: Normal breath sounds.  Abdominal:     Palpations: Abdomen is soft.     Tenderness: There is no abdominal tenderness.  Musculoskeletal:     Cervical back: Neck supple.  Skin:    General: Skin is warm and dry.  Neurological:     Mental Status: She is alert.  Psychiatric:        Attention and Perception: Attention and perception normal.        Mood and Affect: Affect normal. Mood is anxious.        Speech: Speech normal.        Behavior: Behavior normal. Behavior is cooperative.  Thought Content: Thought content normal. Thought content does not include homicidal or suicidal ideation.     ED Results / Procedures / Treatments   Labs (all labs ordered are listed, but only abnormal results are displayed) Labs Reviewed  COMPREHENSIVE METABOLIC PANEL - Abnormal; Notable for the following components:      Result Value   BUN 21 (*)    All other components within normal limits  SALICYLATE LEVEL - Abnormal; Notable for the following components:   Salicylate Lvl Q000111Q (*)    All other components within normal limits  ACETAMINOPHEN LEVEL - Abnormal; Notable for the following components:   Acetaminophen (Tylenol), Serum <10 (*)    All other components within normal limits  CBC - Abnormal; Notable for the following components:   Hemoglobin 11.7 (*)    All other components within normal limits  RAPID URINE DRUG SCREEN, HOSP PERFORMED - Abnormal; Notable for the following components:   Amphetamines POSITIVE (*)    All other components within normal limits  ETHANOL    EKG None  Radiology No results found.  Procedures Procedures (including critical care time)  Medications Ordered in ED Medications - No data to display  ED Course  I have reviewed the triage vital signs and the nursing notes.  Pertinent labs & imaging results that were available during my care  of the patient were reviewed by me and considered in my medical decision making (see chart for details).    MDM Rules/Calculators/A&P                      Patient is a 54 year old female who presents for worsening symptoms of sleep disturbance and anxiety since running out of her nighttime medications of Seroquel and prazosin 4 days ago.    Vital signs are normal.  Patient is well-appearing.  She has no physical complaints.  She is simply unable to sleep adequately since running out of her medications.  This has caused worsening anxiety.  In the ED, she is at times tearful due to her inability to get the medications that have been helpful to her for so long.  Given of these were long-term medications and patient has run out of them 4 days ago, she is at risk for withdrawal.  Additionally, these seem to be medications that significantly have helped her with mood, anxiety, and sleep.  Unfortunately, she is not able to be seen by her PCP for another 4 weeks.  PCPs office stated that she would need to be seen prior to getting her prescriptions refilled.   Patient was prescribed 30-days of her home meds of Seroquel (300 mg nightly) and prazosin (2 mg nightly).  She was told to keep her PCP appointment for 4 weeks from now for further management of her long-term medications.  She was discharged in stable condition.  Final Clinical Impression(s) / ED Diagnoses Final diagnoses:  Medication refill  Anxiety    Rx / DC Orders ED Discharge Orders         Ordered    QUEtiapine (SEROQUEL) 300 MG tablet  Daily at bedtime     07/19/19 2320    prazosin (MINIPRESS) 2 MG capsule  Daily at bedtime     07/19/19 2320           Godfrey Pick, MD 07/20/19 [id number]    Rex Kras, Wenda Overland, MD 07/21/19 [phone]

## 2019-07-19 NOTE — Discharge Instructions (Signed)
Keep your scheduled PCP appointment for next month.  Return to the ED for any worsening symptoms.

## 2019-07-19 NOTE — ED Triage Notes (Signed)
Pt here from home-on medication for anxiety and PTSD but ran out three days ago and has been having night terrors, etc. Called PCP for med refill who stated she couldn't see her until 6/16. Denies SI/HI.

## 2019-08-15 ENCOUNTER — Encounter: Payer: Self-pay | Admitting: Internal Medicine

## 2019-08-15 ENCOUNTER — Ambulatory Visit (INDEPENDENT_AMBULATORY_CARE_PROVIDER_SITE_OTHER): Payer: Self-pay | Admitting: Internal Medicine

## 2019-08-15 ENCOUNTER — Other Ambulatory Visit: Payer: Self-pay

## 2019-08-15 VITALS — BP 126/78 | HR 78 | Resp 12 | Ht 62.0 in | Wt 160.0 lb

## 2019-08-15 DIAGNOSIS — F4310 Post-traumatic stress disorder, unspecified: Secondary | ICD-10-CM

## 2019-08-15 DIAGNOSIS — F329 Major depressive disorder, single episode, unspecified: Secondary | ICD-10-CM

## 2019-08-15 DIAGNOSIS — K029 Dental caries, unspecified: Secondary | ICD-10-CM

## 2019-08-15 DIAGNOSIS — Z79899 Other long term (current) drug therapy: Secondary | ICD-10-CM

## 2019-08-15 DIAGNOSIS — E039 Hypothyroidism, unspecified: Secondary | ICD-10-CM

## 2019-08-15 DIAGNOSIS — Z1231 Encounter for screening mammogram for malignant neoplasm of breast: Secondary | ICD-10-CM

## 2019-08-15 DIAGNOSIS — F32A Depression, unspecified: Secondary | ICD-10-CM

## 2019-08-15 DIAGNOSIS — Z13220 Encounter for screening for lipoid disorders: Secondary | ICD-10-CM

## 2019-08-15 MED ORDER — PRAZOSIN HCL 2 MG PO CAPS: 2.0000 mg | ORAL_CAPSULE | Freq: Every day | ORAL | 11 refills | Status: DC

## 2019-08-15 MED ORDER — QUETIAPINE FUMARATE 300 MG PO TABS: 300.0000 mg | ORAL_TABLET | Freq: Every day | ORAL | 11 refills | Status: DC

## 2019-08-15 MED ORDER — QUETIAPINE FUMARATE 100 MG PO TABS: ORAL_TABLET | ORAL | 11 refills | Status: DC

## 2019-08-15 NOTE — Progress Notes (Signed)
Subjective:    Patient ID: Meghan Contreras, female   DOB: 24-Apr-1965, 54 y.o.   MRN: [id number]   HPI   Back after a long hiatus.  1.  Hypothyroidism:  She is doing well now after taking her meds regularly since last seen in October 2020.  Her life has changed since doing so.    2.  History of opioid and heroin abuse:  Has been on Suboxone 8-2 mg sublingual twice daily for 4 years with Suboxone clinic associated with [facility].  Almost died from Fentanyl in 05/28/15, so she went to see about Suboxone thereafter.  Is seen there every 2 weeks.  She will no longer receive support for medication after her current prescription.  Apparently will cost $70 per month when using GoodRx.  Suspect this is actually per 2 weeks based on GoodRx estimates I see today.  3.  Glottal breathing and COPD:  The episodes of choking/glottal breathing have stopped with taking her meds regularly.    4.  Tobacco use:  Still smoking 1 ppd.  She is not interested in stopping at this time.  No alcohol use.   5.  Bipolar I/PTSD:  Back on Seroquel, which has also helped.  Was going to a physician in [place] every month as she had her Medicaid there.  She did not stop going there until her Suboxone was covered in [place].  She will need prescriptions for Seroquel and Prazosin.  Takes the latter for nightmares--has been taking for past year.  Has been on and off Seroquel since she believes 1997.    6.  Dental decay--no teeth in upper jaw essentially and all teeth in lower jaw decayed     Current Meds  Medication Sig  . buprenorphine-naloxone (SUBOXONE) 8-2 mg SUBL SL tablet Place 1 tablet under the tongue 2 (two) times daily.  Marland Kitchen docusate sodium (COLACE) 100 MG capsule Take 100 mg by mouth daily.  Marland Kitchen levothyroxine (SYNTHROID) 100 MCG tablet 1 tab by mouth daily 1/2 hour before breakfast or other meds  . prazosin (MINIPRESS) 2 MG capsule Take 1 capsule (2 mg total) by mouth at bedtime.  Marland Kitchen QUEtiapine (SEROQUEL) 100 MG  tablet Take 100 mg by mouth daily.  . QUEtiapine (SEROQUEL) 300 MG tablet Take 1 tablet (300 mg total) by mouth at bedtime.   Allergies  Allergen Reactions  . Amoxicillin Swelling  . Latex Hives and Swelling     Review of Systems    Objective:   BP 126/78 (BP Location: Left Arm, Patient Position: Sitting, Cuff Size: Normal)   Pulse 78   Resp 12   Ht 5\' 2"  (1.575 m)   Wt 160 lb (72.6 kg)   BMI 29.26 kg/m   Physical Exam  Remains hoarse (following thyroid surgery) Looks 20 years younger--coarseness of face/facial skin folds has resolved, no doughiness/edema to face.  Face is thinner. Neck:  Supple, No adenopathy Chest:  CTA CV:  RRR without murmur or rub.  Radial and DP pulses normal and equal Abd:  S, NT, No HSM or mass, + BS   Assessment & Plan   1.  Hypothyroidism:  Looks great and likely at an adequate thyroid replacement level.  TSH.  2.  Dental Decay:  Dental referral.    3. Bipolar I/PTSD:  Continue Seroquel and Prazosin.  Refilled.  Encouraged working with [employer]  4.  HM:  FLP, CBC, Mammogram.  Pfizer COVID vaccine.  Return in 3 weeks for 2nd.   5.  History of opioid/heroin abuse:  suboxone clinic.  CPE without pap in 3 months.

## 2019-08-16 LAB — CBC WITH DIFFERENTIAL/PLATELET
Basophils Absolute: 0.1 10*3/uL (ref 0.0–0.2)
Basos: 1 %
EOS (ABSOLUTE): 0.1 10*3/uL (ref 0.0–0.4)
Eos: 2 %
Hematocrit: 35 % (ref 34.0–46.6)
Hemoglobin: 11.8 g/dL (ref 11.1–15.9)
Immature Grans (Abs): 0 10*3/uL (ref 0.0–0.1)
Immature Granulocytes: 0 %
Lymphocytes Absolute: 2.6 10*3/uL (ref 0.7–3.1)
Lymphs: 32 %
MCH: 27.4 pg (ref 26.6–33.0)
MCHC: 33.7 g/dL (ref 31.5–35.7)
MCV: 81 fL (ref 79–97)
Monocytes Absolute: 0.5 10*3/uL (ref 0.1–0.9)
Monocytes: 6 %
Neutrophils Absolute: 4.9 10*3/uL (ref 1.4–7.0)
Neutrophils: 59 %
Platelets: 379 10*3/uL (ref 150–450)
RBC: 4.3 x10E6/uL (ref 3.77–5.28)
RDW: 13.9 % (ref 11.7–15.4)
WBC: 8.2 10*3/uL (ref 3.4–10.8)

## 2019-08-16 LAB — LIPID PANEL W/O CHOL/HDL RATIO
Cholesterol, Total: 304 mg/dL — ABNORMAL HIGH (ref 100–199)
HDL: 35 mg/dL — ABNORMAL LOW (ref >39)
LDL Chol Calc (NIH): 210 mg/dL — ABNORMAL HIGH (ref 0–99)
Triglycerides: 291 mg/dL — ABNORMAL HIGH (ref 0–149)
VLDL Cholesterol Cal: 59 mg/dL — ABNORMAL HIGH (ref 5–40)

## 2019-08-16 LAB — TSH: TSH: 15.9 u[IU]/mL — ABNORMAL HIGH (ref 0.450–4.500)

## 2019-08-29 MED ORDER — LEVOTHYROXINE SODIUM 112 MCG PO TABS: ORAL_TABLET | ORAL | 11 refills | Status: DC

## 2019-08-29 MED ORDER — ATORVASTATIN CALCIUM 40 MG PO TABS
ORAL_TABLET | ORAL | 11 refills | Status: DC
Start: 2019-08-29 — End: 2020-01-08

## 2019-09-04 ENCOUNTER — Other Ambulatory Visit: Payer: Self-pay

## 2019-10-04 ENCOUNTER — Other Ambulatory Visit: Payer: Self-pay

## 2019-11-23 ENCOUNTER — Encounter: Payer: Self-pay | Admitting: Internal Medicine

## 2020-01-08 ENCOUNTER — Encounter: Payer: Self-pay | Admitting: Internal Medicine

## 2020-01-08 ENCOUNTER — Ambulatory Visit: Payer: Self-pay | Admitting: Internal Medicine

## 2020-01-08 VITALS — BP 116/80 | Resp 15 | Ht 61.0 in | Wt 178.0 lb

## 2020-01-08 DIAGNOSIS — Z1231 Encounter for screening mammogram for malignant neoplasm of breast: Secondary | ICD-10-CM

## 2020-01-08 DIAGNOSIS — H9190 Unspecified hearing loss, unspecified ear: Secondary | ICD-10-CM

## 2020-01-08 DIAGNOSIS — Z720 Tobacco use: Secondary | ICD-10-CM

## 2020-01-08 DIAGNOSIS — E039 Hypothyroidism, unspecified: Secondary | ICD-10-CM

## 2020-01-08 DIAGNOSIS — Z0000 Encounter for general adult medical examination without abnormal findings: Secondary | ICD-10-CM

## 2020-01-08 DIAGNOSIS — F1120 Opioid dependence, uncomplicated: Secondary | ICD-10-CM | POA: Insufficient documentation

## 2020-01-08 DIAGNOSIS — Z23 Encounter for immunization: Secondary | ICD-10-CM

## 2020-01-08 DIAGNOSIS — E782 Mixed hyperlipidemia: Secondary | ICD-10-CM | POA: Insufficient documentation

## 2020-01-08 DIAGNOSIS — E785 Hyperlipidemia, unspecified: Secondary | ICD-10-CM | POA: Insufficient documentation

## 2020-01-08 DIAGNOSIS — K029 Dental caries, unspecified: Secondary | ICD-10-CM

## 2020-01-08 IMAGING — DX DG CHEST 2V
2 series · 2 of 2 positions shown · non-contrast
Comparison: None.

CLINICAL DATA: Shortness of breath

EXAM:
CHEST - 2 VIEW

[chest pa]
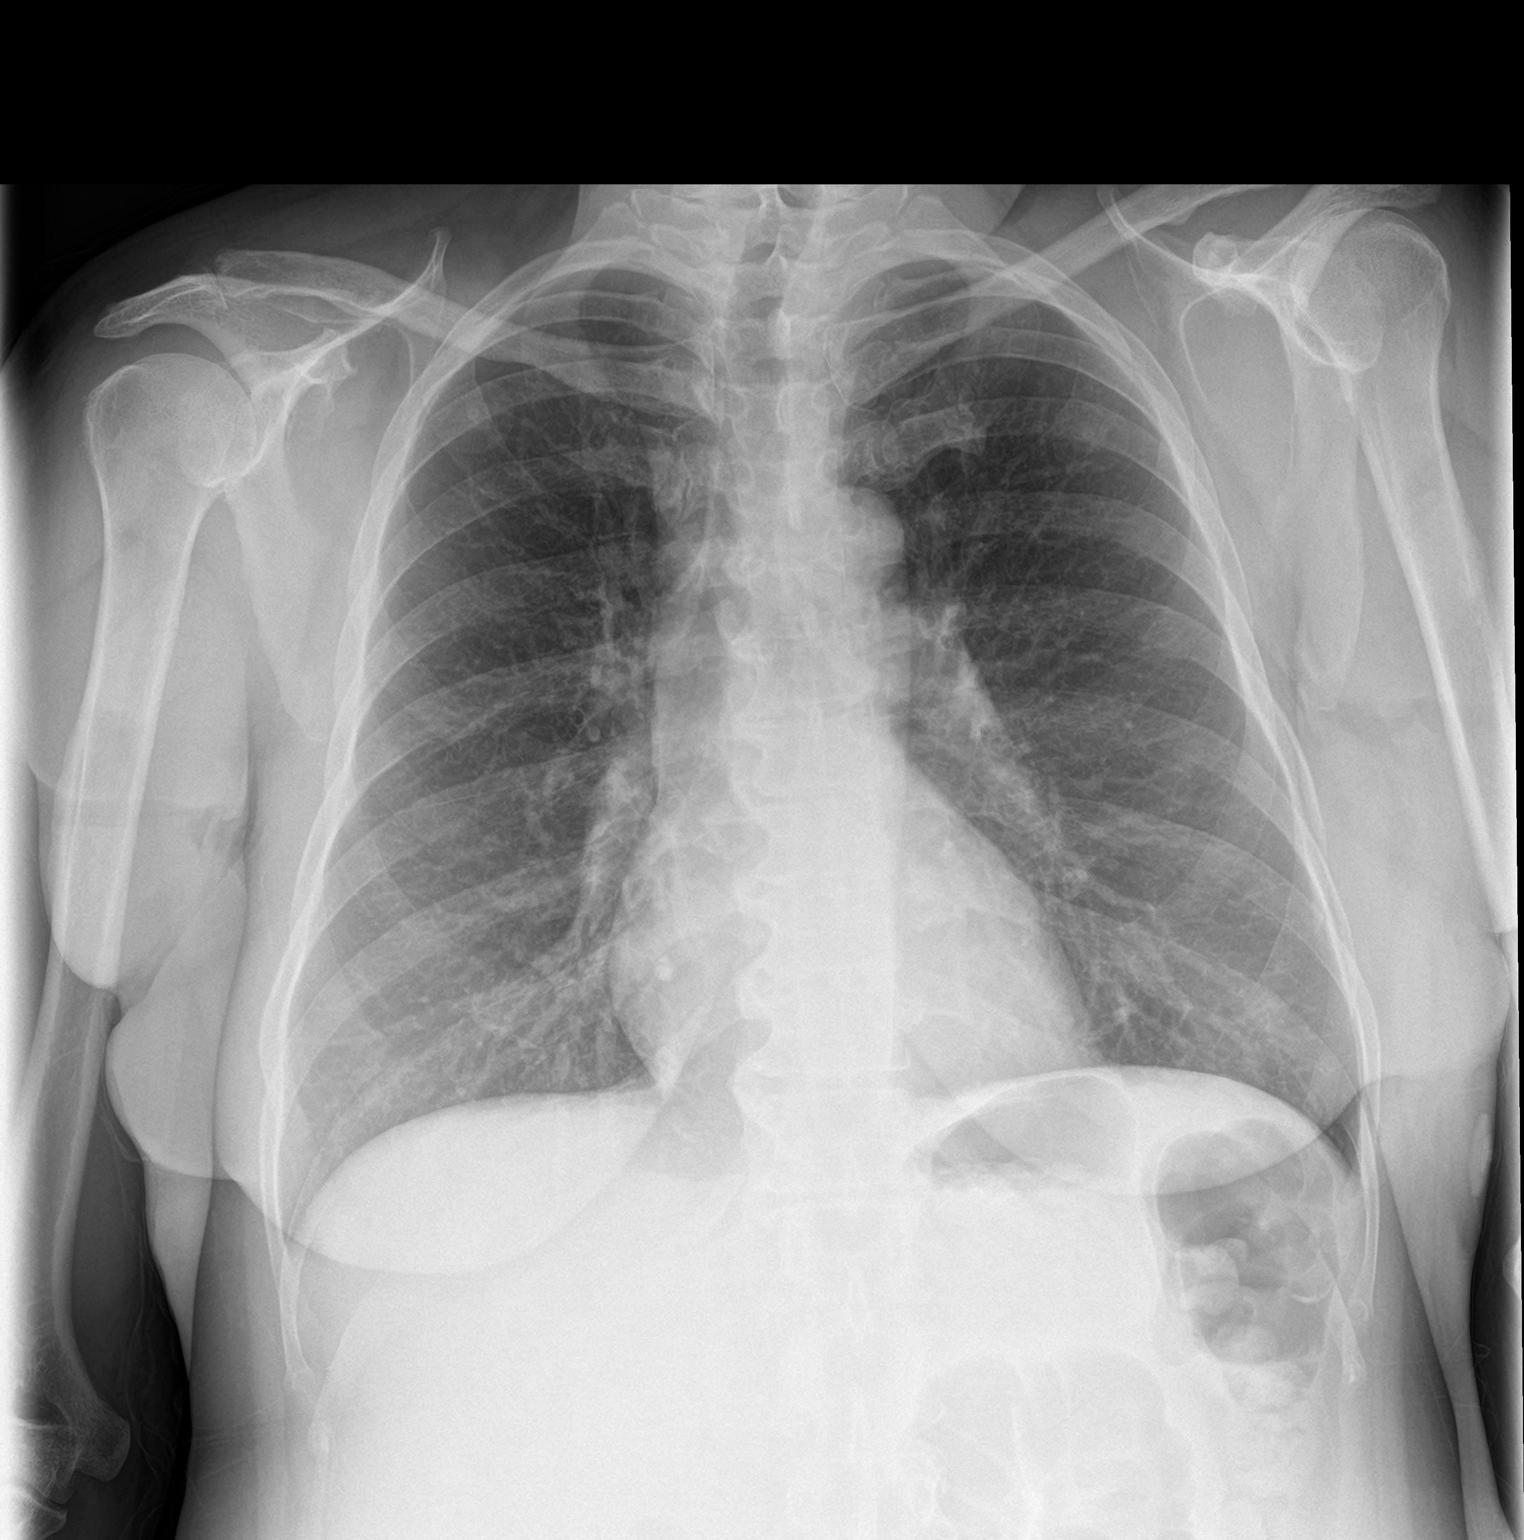

[chest lat]
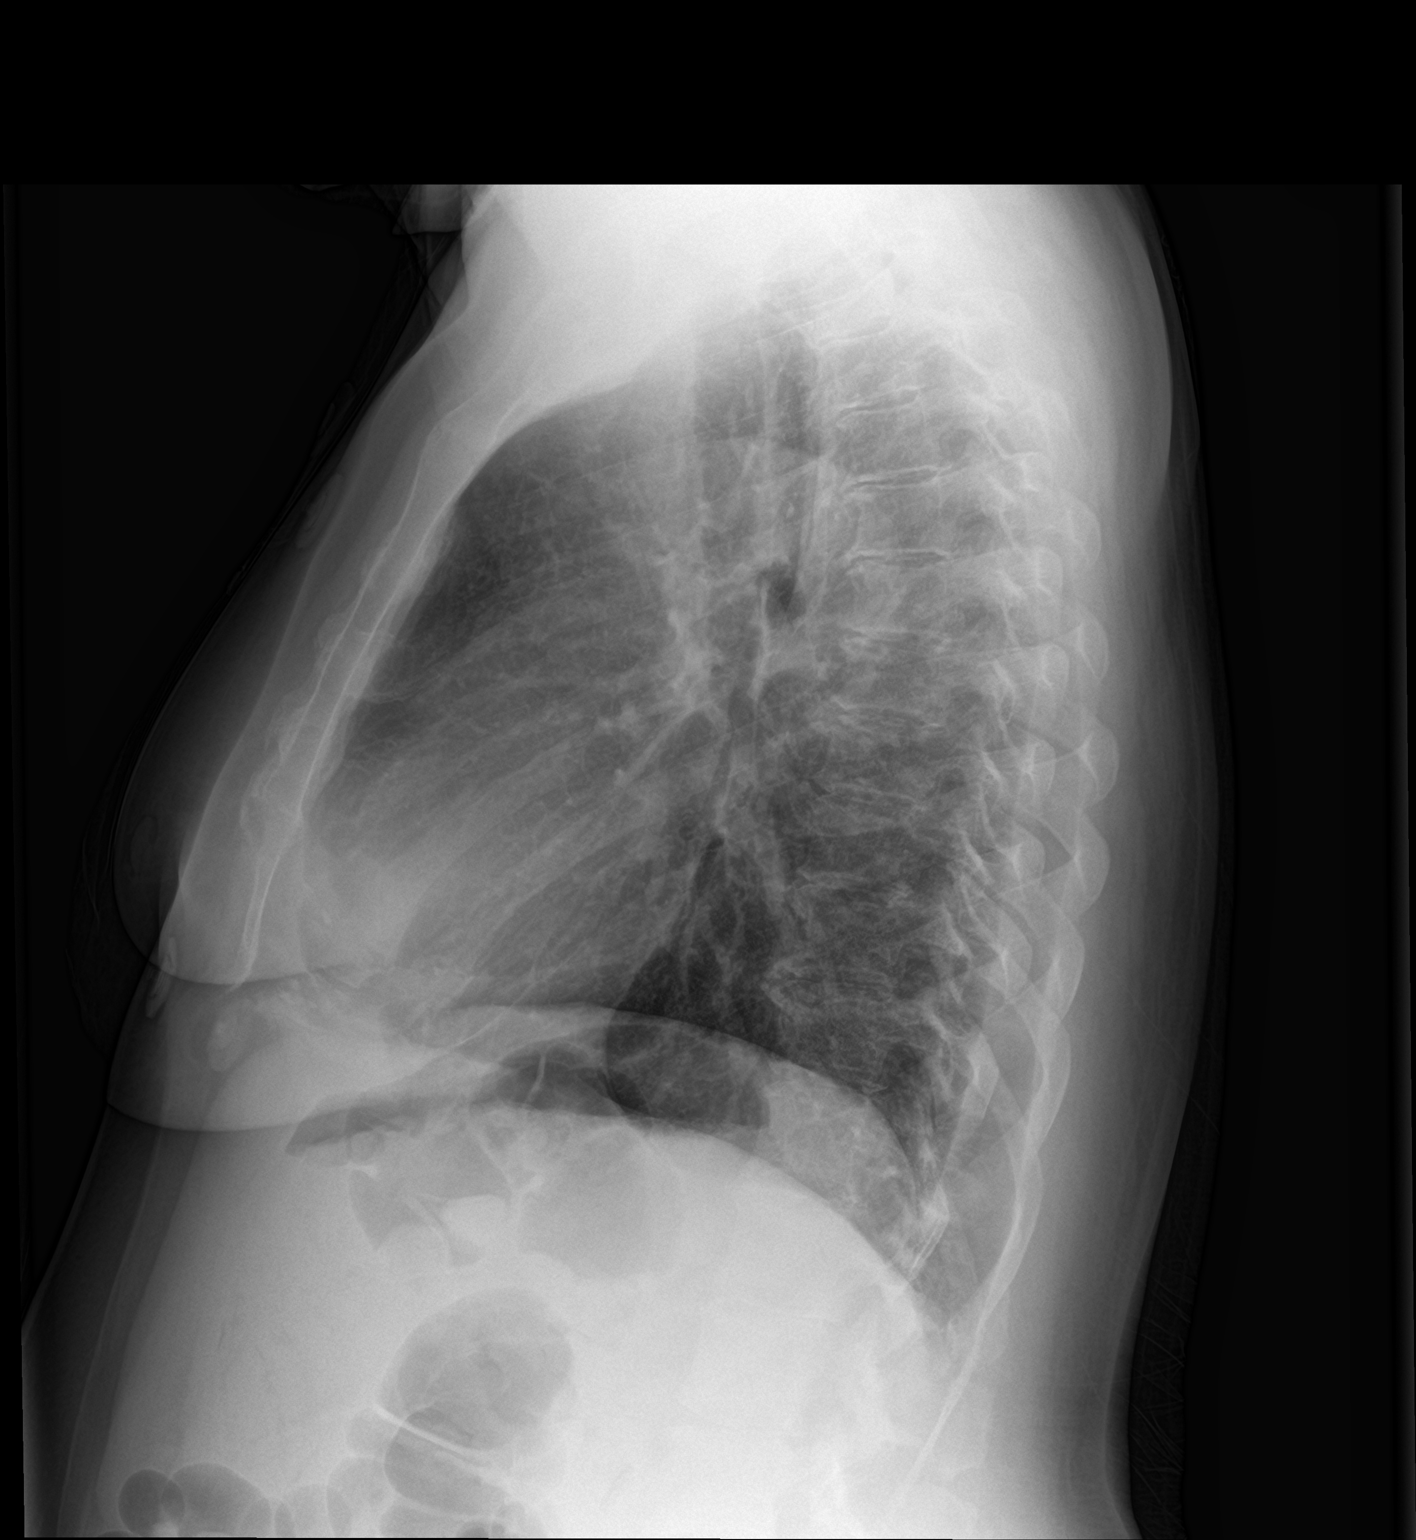

[2 of 2 positions shown; findings below may reference images not displayed]

FINDINGS: There is no edema or consolidation. The heart size and pulmonary
vascularity are normal. No adenopathy. There is degenerative change
in the thoracic spine.
IMPRESSION: No edema or consolidation.

## 2020-01-08 MED ORDER — ATORVASTATIN CALCIUM 40 MG PO TABS: ORAL_TABLET | ORAL | 11 refills | Status: DC

## 2020-01-08 NOTE — Patient Instructions (Signed)

## 2020-01-08 NOTE — Progress Notes (Signed)
Subjective:    Patient ID: Meghan Contreras, female   DOB: 1965-04-19, 54 y.o.   MRN: [id number]   HPI   CPE without pap  1.  Pap:  Last was 2 years ago and normal  2.  Mammogram:  She did not recall we were sending her for that with her visit in June.  Was not answering her phone or keeping voicemail cleared.  No family history of breast cancer.    3.  Osteoprevention:  Drinks milk and eats cheese--2 serving daily.  Willing to drink 3-4 cups of almond milk daily.  She does walk daily, but only 15 minutes.    4.  Guaiac Cards:  Never.  5.  Colonoscopy:  Never.  No family history of colon cancer.   6.  Immunizations:   Immunization History  Administered Date(s) Administered  . PFIZER SARS-COV-2 Vaccination 08/15/2019, 09/04/2019  . Td 01/08/2016     7.  Glucose/Cholesterol:  History of elevated glucose in past when thyroid not well controlled. Cholesterol quite high, but patient never responded to calls or letter sent out.  Describes a poor diet.      Current Meds  Medication Sig  . atorvastatin (LIPITOR) 40 MG tablet 1 tab by mouth daily with evening meal  . buprenorphine-naloxone (SUBOXONE) 8-2 mg SUBL SL tablet Place 1 tablet under the tongue 2 (two) times daily.  Marland Kitchen levothyroxine (SYNTHROID) 112 MCG tablet 1 tab by mouth daily 1/2 hour before breakfast or other meds  . QUEtiapine (SEROQUEL) 100 MG tablet 1 tab by mouth in the morning  . QUEtiapine (SEROQUEL) 300 MG tablet Take 1 tablet (300 mg total) by mouth at bedtime.      Allergies  Allergen Reactions  . Amoxicillin Swelling  . Latex Hives and Swelling   Past Medical History:  Diagnosis Date  . Anxiety   . Depression   . HOH (hard of hearing)   . Hyperlipidemia 2021  . Hyperthyroidism   . Opiate addiction ([place])    On chronic suboxone since 2017 this last go around.    Marland Kitchen PTSD (post-traumatic stress disorder)   . Stridor 2019   Developed after total thyroidectomy.  Old records from [facility]  with CT of soft tissue of chest and neck 11/2017:  No findings to suggest cause of stridorous breathing and no obvious lung disease.      Past Surgical History:  Procedure Laterality Date  . THYROIDECTOMY  2019    Family History  Problem Relation Age of Onset  . HIV/AIDS Mother        from drug abuse  . Drug abuse Mother   . Drug abuse Brother   . Hepatitis C Brother        From IV drug abuse  . Drug abuse Sister   . Drug abuse Sister   . Drug abuse Sister   . Drug abuse Sister   . Drug abuse Sister   . Cancer Sister        lung  . Drug abuse Sister     Social History   Socioeconomic History  . Marital status: Legally Separated    Spouse name: Not on file  . Number of children: 2  . Years of education: Not on file  . Highest education level: Some college, no degree  Occupational History  . Occupation: unemployed    Comment: Supported by daughter, ex husband and boyfriend.  Tobacco Use  . Smoking status: Current Every Day Smoker  Packs/day: 1.00    Years: 42.00    Pack years: 42.00  . Smokeless tobacco: Never Used  Substance and Sexual Activity  . Alcohol use: No  . Drug use: Yes    Comment: Benzodiazepines / Klonopin/opiate addiction.  Also history of crack cocaine abuse.  2021 clean for 2017 on Suboxone.   . Sexual activity: Not Currently  Other Topics Concern  . Not on file  Social History Narrative   Lives alone, though her boyfriend will visit for 2 months at a time.   Social Determinants of Health   Financial Resource Strain:   . Difficulty of Paying Living Expenses: Not on file  Food Insecurity:   . Worried About Charity fundraiser in the Last Year: Not on file  . Ran Out of Food in the Last Year: Not on file  Transportation Needs:   . Lack of Transportation (Medical): Not on file  . Lack of Transportation (Non-Medical): Not on file  Physical Activity:   . Days of Exercise per Week: Not on file  . Minutes of Exercise per Session: Not on file    Stress:   . Feeling of Stress : Not on file  Social Connections:   . Frequency of Communication with Friends and Family: Not on file  . Frequency of Social Gatherings with Friends and Family: Not on file  . Attends Religious Services: Not on file  . Active Member of Clubs or Organizations: Not on file  . Attends Archivist Meetings: Not on file  . Marital Status: Not on file  Intimate Partner Violence:   . Fear of Current or Ex-Partner: Not on file  . Emotionally Abused: Not on file  . Physically Abused: Not on file  . Sexually Abused: Not on file     Review of Systems  Constitutional: Negative for appetite change and fatigue.  HENT: Positive for dental problem (Teeth are bad--none on top and bottom teeth all hurt.) and hearing loss. Negative for rhinorrhea, sneezing and sore throat.   Eyes: Positive for visual disturbance (Reading glasses take care of it.  ).  Respiratory: Positive for shortness of breath (sometimes if moves too quickly or pushing pulling  things.). Negative for cough.   Cardiovascular: Negative for chest pain, palpitations and leg swelling.  Gastrointestinal: Positive for constipation (from suboxone.  Okay with stool softener). Negative for abdominal pain, blood in stool (no melena.  She does have hemorrhoids.) and diarrhea.  Genitourinary: Negative for dysuria, frequency, vaginal bleeding and vaginal discharge.  Musculoskeletal: Negative for arthralgias.  Skin: Negative for rash.  Neurological: Negative for weakness and numbness.  Psychiatric/Behavioral: Negative for dysphoric mood (Feels she has this under control). The patient is not nervous/anxious.       Objective:   BP 116/80 (BP Location: Left Arm, Patient Position: Sitting, Cuff Size: Normal)   Resp 15   Ht 5\' 1"  (1.549 m)   Wt 178 lb (80.7 kg)   BMI 33.63 kg/m   Physical Exam Constitutional:      Appearance: Normal appearance.  HENT:     Head: Normocephalic and atraumatic.      Right Ear: Tympanic membrane, ear canal and external ear normal.     Left Ear: Tympanic membrane, ear canal and external ear normal.     Nose: Nose normal.     Mouth/Throat:     Mouth: Mucous membranes are moist.     Pharynx: Oropharynx is clear.     Comments: Dental decay Eyes:  Extraocular Movements: Extraocular movements intact.     Conjunctiva/sclera: Conjunctivae normal.     Pupils: Pupils are equal, round, and reactive to light.  Neck:     Thyroid: No thyroid mass or thyromegaly.  Cardiovascular:     Rate and Rhythm: Normal rate and regular rhythm.     Heart sounds: S1 normal and S2 normal. No murmur heard.  No friction rub. No S3 or S4 sounds.      Comments: No carotid bruits.  Carotid, radial, femoral, DP and PT pulses normal and equal.  Pulmonary:     Effort: Pulmonary effort is normal.     Breath sounds: Normal breath sounds.     Comments: Some upper airway/glottal expiratory noise. Chest:     Breasts:        Right: No inverted nipple, mass, nipple discharge or tenderness.        Left: No inverted nipple, mass or tenderness.  Abdominal:     General: Bowel sounds are normal.     Palpations: Abdomen is soft. There is no hepatomegaly, splenomegaly or mass.     Tenderness: There is no abdominal tenderness.     Hernia: No hernia is present.  Genitourinary:    Comments: Normal external female genitalia.   No uterine or adnexal mass or tenderness. Musculoskeletal:        General: Normal range of motion.     Cervical back: Normal range of motion and neck supple.  Lymphadenopathy:     Head:     Right side of head: No submental or submandibular adenopathy.     Left side of head: No submental or submandibular adenopathy.     Cervical: No cervical adenopathy.     Upper Body:     Right upper body: No supraclavicular or axillary adenopathy.     Left upper body: No supraclavicular or axillary adenopathy.     Lower Body: No right inguinal adenopathy. No left inguinal  adenopathy.  Skin:    General: Skin is warm.     Capillary Refill: Capillary refill takes less than 2 seconds.     Findings: No rash.  Neurological:     Mental Status: She is alert.     Cranial Nerves: Cranial nerves are intact.     Sensory: Sensation is intact.     Motor: Motor function is intact.     Coordination: Coordination is intact.     Gait: Gait is intact.     Deep Tendon Reflexes: Reflexes are normal and symmetric.  Psychiatric:        Attention and Perception: Attention normal.        Mood and Affect: Mood normal.        Speech: Speech normal.        Behavior: Behavior normal. Behavior is cooperative.        Thought Content: Thought content normal.        Cognition and Memory: Cognition normal.        Judgment: Judgment normal.        Assessment & Plan  1.  CPE without pap Guaiac cards x 3 to return in 2 weeks. Mammogram Influenza vaccine.  2  Nightmares:  Unable to get Prazosin for a time.  Refilled with GoodRx coupon to [facility] on [address]  3.  Hyperlipidemia:  Discussed diet, which is quite poor, drinking 2L of soda daily and eating junk food from a convenience store across the street.  Encouraged making small goals on a weekly basis to  make change.  Would start with discontinuing soda.  4.  Hypothyroidism:  Never returned for TSH in the summer.  TSH today.    5.  Poor hearing:  [facility]  6.  Dental decay:  Dental referral.

## 2020-01-09 LAB — TSH: TSH: 2.42 u[IU]/mL (ref 0.450–4.500)

## 2020-01-14 ENCOUNTER — Telehealth: Payer: Self-pay | Admitting: Internal Medicine

## 2020-01-15 ENCOUNTER — Other Ambulatory Visit: Payer: Self-pay

## 2020-01-15 DIAGNOSIS — Z79899 Other long term (current) drug therapy: Secondary | ICD-10-CM

## 2020-01-15 MED ORDER — PRAZOSIN HCL 2 MG PO CAPS: 2.0000 mg | ORAL_CAPSULE | Freq: Every day | ORAL | 11 refills | Status: DC

## 2020-01-15 NOTE — Telephone Encounter (Signed)
Rx done. 

## 2020-01-15 NOTE — Telephone Encounter (Signed)
Patient called stating her [facility] card is expired and needs prazosin (MINIPRESS) 2 MG capsule [[id number]] sent to [facility] on [address] .  Routing to [facility] to send over Rx

## 2020-01-16 MED ORDER — PRAZOSIN HCL 2 MG PO CAPS: 2.0000 mg | ORAL_CAPSULE | Freq: Every day | ORAL | 11 refills | Status: DC

## 2020-01-16 NOTE — Progress Notes (Signed)
Originally printed instead of being sent electronically--sending electronically today.

## 2020-01-18 ENCOUNTER — Other Ambulatory Visit: Payer: Self-pay

## 2020-01-18 ENCOUNTER — Telehealth: Payer: Self-pay | Admitting: Internal Medicine

## 2020-01-18 DIAGNOSIS — Z79899 Other long term (current) drug therapy: Secondary | ICD-10-CM

## 2020-01-18 DIAGNOSIS — F4310 Post-traumatic stress disorder, unspecified: Secondary | ICD-10-CM

## 2020-01-18 MED ORDER — QUETIAPINE FUMARATE 300 MG PO TABS: 300.0000 mg | ORAL_TABLET | Freq: Every day | ORAL | 6 refills | Status: DC

## 2020-01-18 MED ORDER — QUETIAPINE FUMARATE 100 MG PO TABS: ORAL_TABLET | ORAL | 6 refills | Status: DC

## 2020-01-18 NOTE — Telephone Encounter (Signed)
Rx done patient aware

## 2020-01-30 DIAGNOSIS — H9190 Unspecified hearing loss, unspecified ear: Secondary | ICD-10-CM | POA: Insufficient documentation

## 2020-01-30 DIAGNOSIS — K029 Dental caries, unspecified: Secondary | ICD-10-CM | POA: Insufficient documentation

## 2020-02-19 ENCOUNTER — Other Ambulatory Visit: Payer: Self-pay

## 2020-04-07 ENCOUNTER — Other Ambulatory Visit: Payer: Self-pay | Admitting: Internal Medicine

## 2020-07-07 ENCOUNTER — Ambulatory Visit: Payer: Self-pay | Admitting: Internal Medicine

## 2020-07-16 ENCOUNTER — Other Ambulatory Visit: Payer: Self-pay

## 2020-07-16 ENCOUNTER — Telehealth: Payer: Self-pay | Admitting: Internal Medicine

## 2020-07-16 ENCOUNTER — Ambulatory Visit: Payer: Self-pay | Admitting: Internal Medicine

## 2020-07-16 NOTE — Telephone Encounter (Signed)
Patient called asking if you can please change all her prescriptions, with the exception of the Thyroid medicine, to the pharmacy in [place] at [address] because the prices at her current pharmacy had increased significantly.

## 2020-07-29 ENCOUNTER — Telehealth: Payer: Self-pay | Admitting: Internal Medicine

## 2020-07-29 DIAGNOSIS — Z79899 Other long term (current) drug therapy: Secondary | ICD-10-CM

## 2020-07-29 NOTE — Telephone Encounter (Signed)
Patient called because she wanted [facility] on [address]. Patient has a [facility] for [facility] for [facility]. Patient stated that [facility] is too expensive. And would like her [facility] to [facility].

## 2020-07-29 NOTE — Telephone Encounter (Signed)
prazosin (MINIPRESS) 2 MG capsule  QUEtiapine (SEROQUEL) 100 MG tablet  QUEtiapine (SEROQUEL) 300 MG tablet

## 2020-07-31 MED ORDER — QUETIAPINE FUMARATE 300 MG PO TABS: 300.0000 mg | ORAL_TABLET | Freq: Every day | ORAL | 6 refills | Status: DC

## 2020-07-31 MED ORDER — PRAZOSIN HCL 2 MG PO CAPS: 2.0000 mg | ORAL_CAPSULE | Freq: Every day | ORAL | 11 refills | Status: DC

## 2020-07-31 MED ORDER — QUETIAPINE FUMARATE 100 MG PO TABS: ORAL_TABLET | ORAL | 6 refills | Status: DC

## 2020-07-31 NOTE — Telephone Encounter (Signed)
Patient has been informed.

## 2020-09-23 ENCOUNTER — Other Ambulatory Visit: Payer: Self-pay | Admitting: Internal Medicine

## 2020-10-31 IMAGING — CT CT NECK WITH CONTRAST
4 of 6 series · 15 of 33 positions shown, 17 images · IV contrast (omnipaque)
Comparison: None.

CLINICAL DATA: Shortness of breath for 6 months, worsening
recently. Thyroid removal.

EXAM:
CT NECK WITH CONTRAST
TECHNIQUE: Multidetector CT imaging of the neck was performed using the
standard protocol following the bolus administration of intravenous
contrast.
CONTRAST:  75mL OMNIPAQUE IOHEXOL 300 MG/ML  SOLN

[Series 3: neck 2.0 st · axial · 0.47mm/px · z∈[-266,-126]mm · 4 of 118 slices shown, 5 images (1 of 3)]
[im 24/118  soft-tissue]
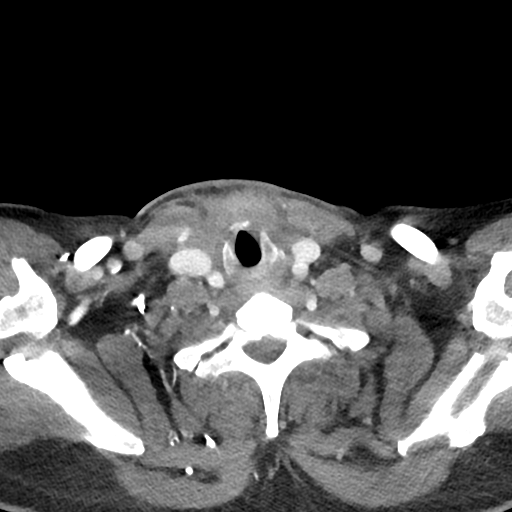
[im 24/118  bone]
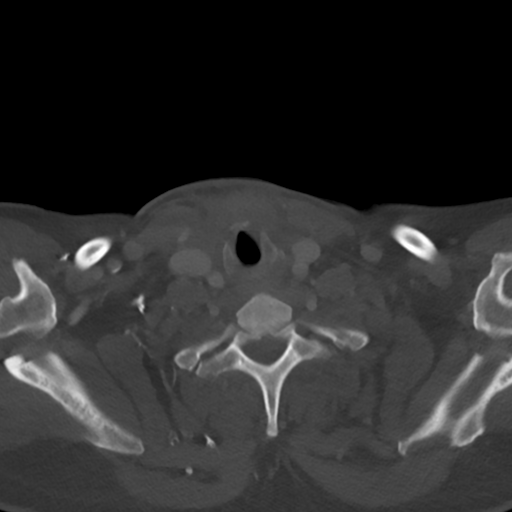
[im 47/118  bone]
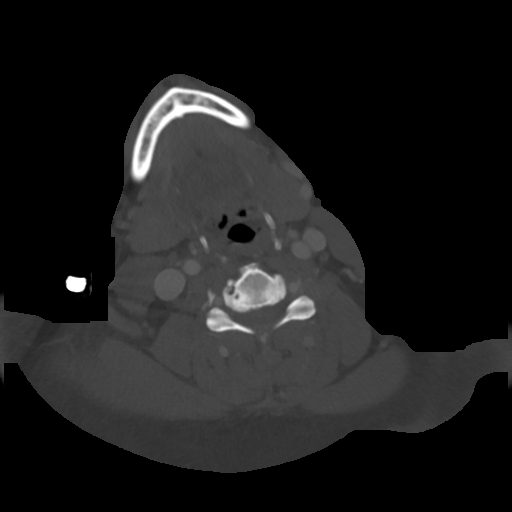
[im 71/118  bone]
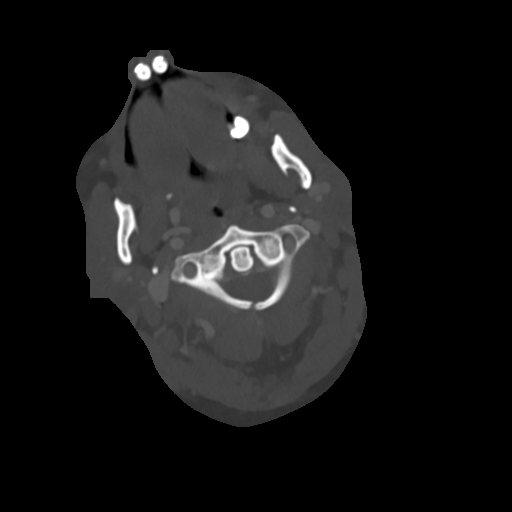
[im 94/118  bone]
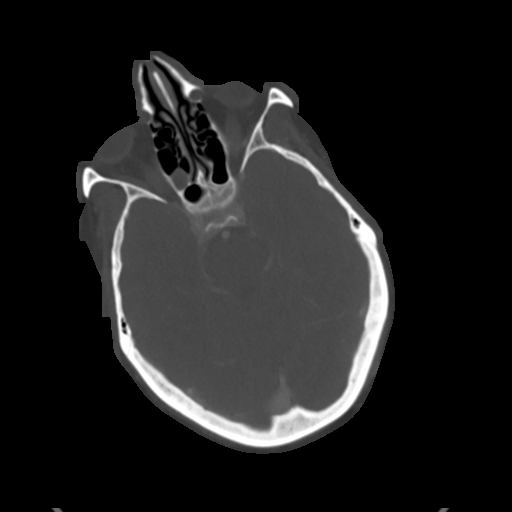

[Series 5: neck 2.0 st · sagittal · 0.46mm/px · 5 of 101 slices shown, 6 images (2 of 3)]
[im 34/101  bone]
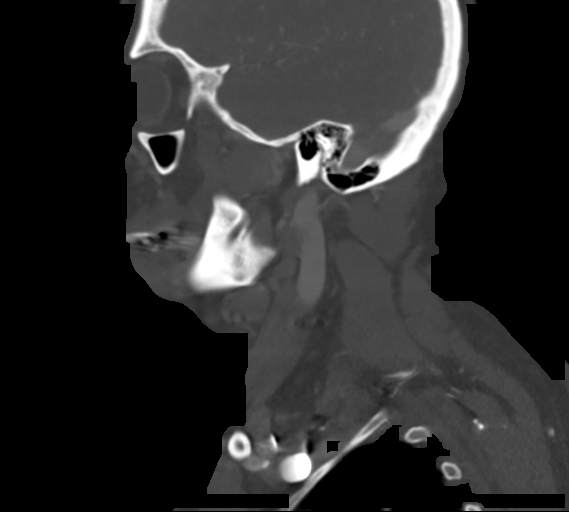
[im 42/101  bone]
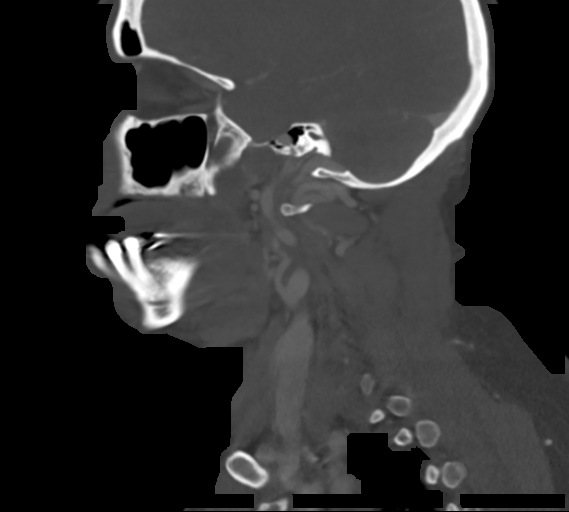
[im 51/101  soft-tissue]
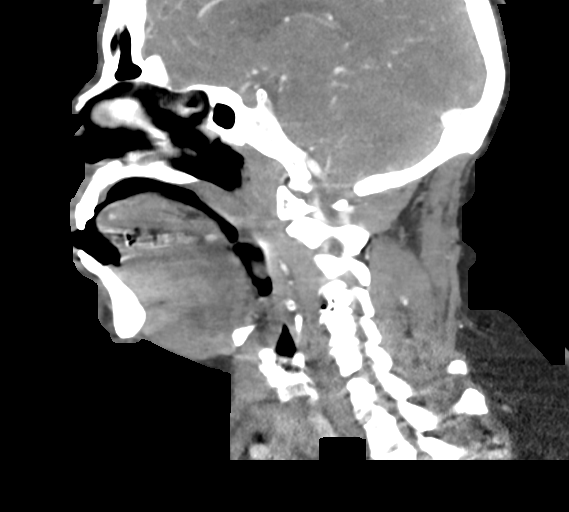
[im 51/101  bone]
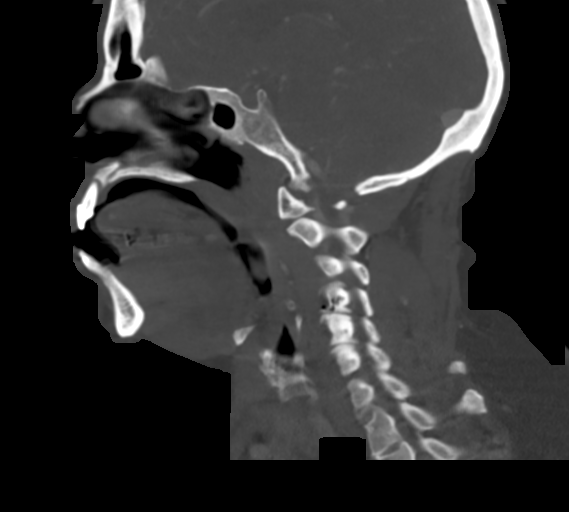
[im 59/101  bone]
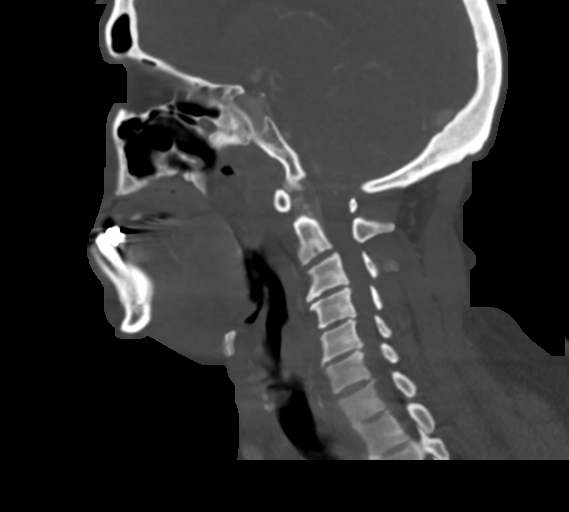
[im 67/101  bone]
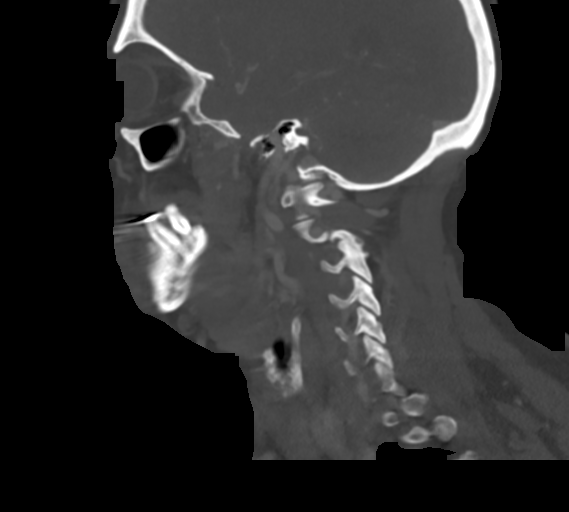

[Series 6: neck 2.0 st · coronal · 0.40mm/px · 3 of 135 slices shown (3 of 3)]
[im 27/135  bone]
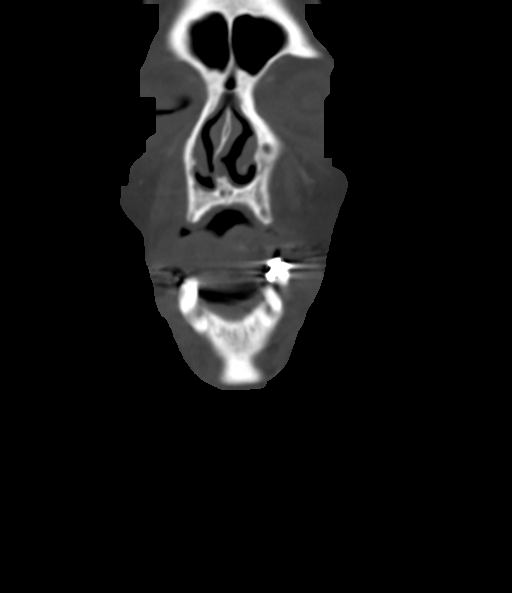
[im 54/135  bone]
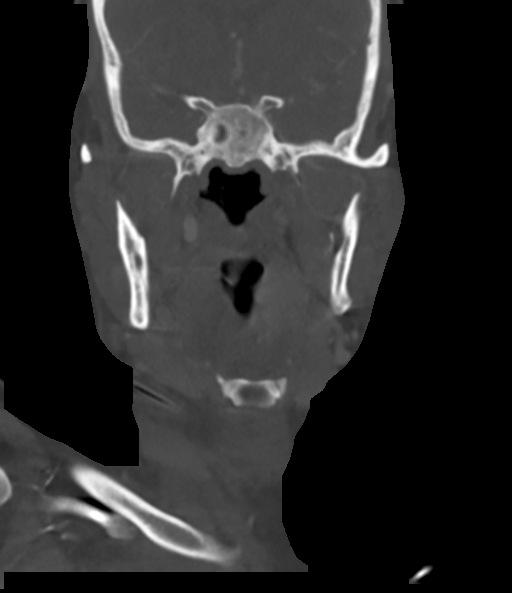
[im 81/135  bone]
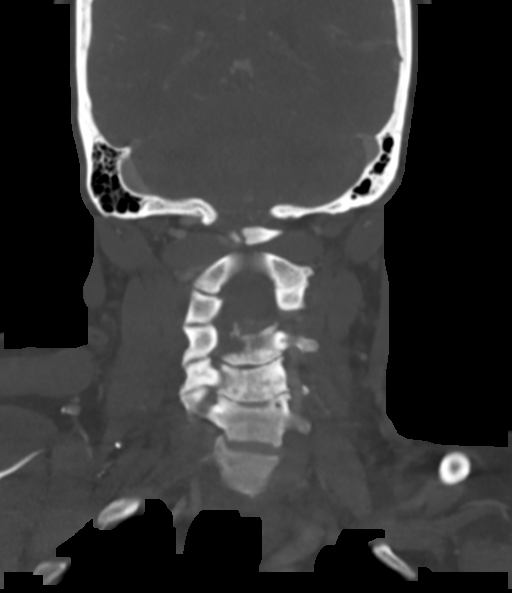

[Series 7: neck 2.0 st orthogonal · axial · 0.39mm/px · z∈[-266,-174]mm · 3 of 117 slices shown]
[im 24/117  bone]
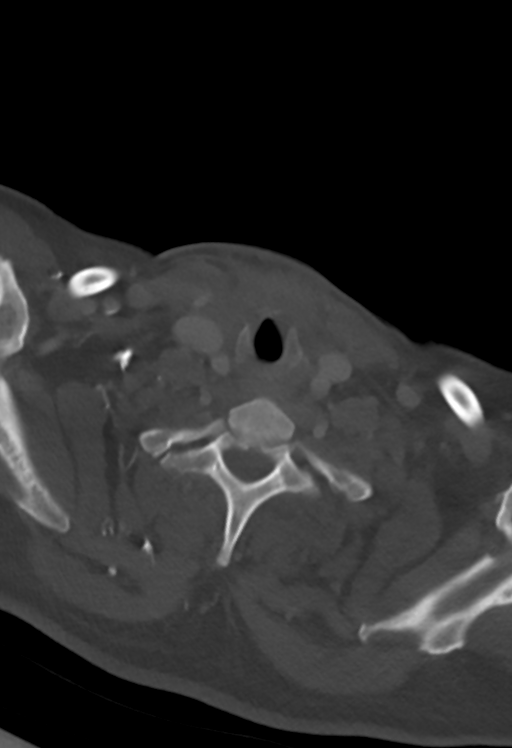
[im 47/117  bone]
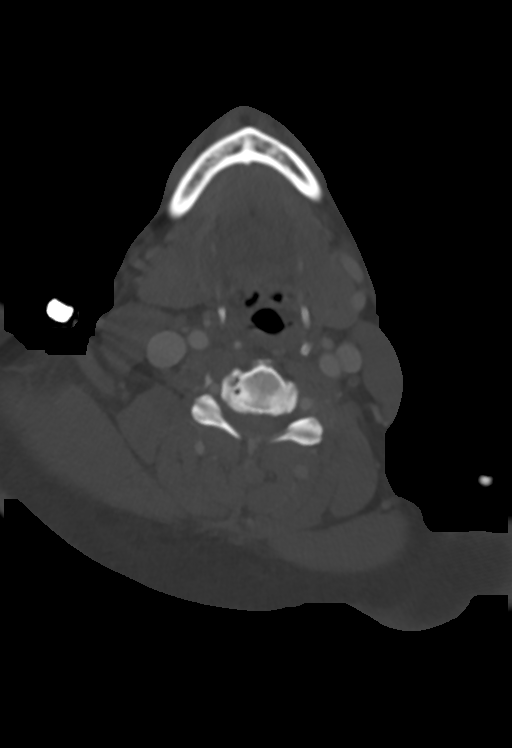
[im 70/117  bone]
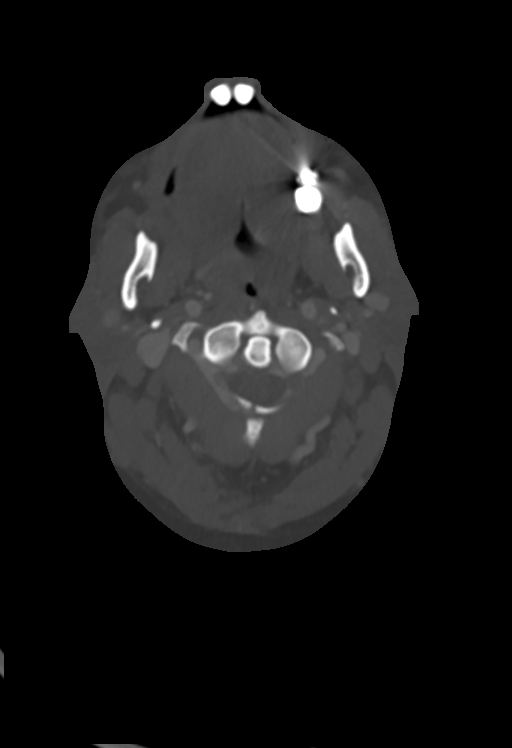

[15 of 33 positions shown; findings below may reference images not displayed]

FINDINGS: The patient was breathing and swallowing throughout the exam. Image
quality is reduced. The study is suboptimal probably diagnostic.

Pharynx and larynx: Normal. No mass or swelling. No subglottic
narrowing.

Salivary glands: No inflammation, mass, or stone.

Thyroid: Surgically absent. No hyperattenuating thyroid tissue is
seen.

Lymph nodes: None enlarged or abnormal density.

Vascular: Patent

Limited intracranial: Negative.

Visualized orbits: Negative.

Mastoids and visualized paranasal sinuses: Negative.

Skeleton: Spondylosis.  Poor dentition.

Upper chest: No visible mass or pneumothorax.

Other: None.
IMPRESSION: Poor quality examination due to patient breathing and swallowing
demonstrating no pharyngeal or laryngeal mass. No subglottic
narrowing. No apparent complications of thyroid surgery.

## 2020-11-03 ENCOUNTER — Other Ambulatory Visit: Payer: Self-pay | Admitting: Internal Medicine

## 2020-11-04 ENCOUNTER — Other Ambulatory Visit: Payer: Self-pay

## 2020-11-04 DIAGNOSIS — E782 Mixed hyperlipidemia: Secondary | ICD-10-CM

## 2020-11-04 DIAGNOSIS — Z79899 Other long term (current) drug therapy: Secondary | ICD-10-CM

## 2020-11-05 LAB — CBC WITH DIFFERENTIAL/PLATELET
Basophils Absolute: 0 10*3/uL (ref 0.0–0.2)
Basos: 1 %
EOS (ABSOLUTE): 0.1 10*3/uL (ref 0.0–0.4)
Eos: 2 %
Hematocrit: 37.5 % (ref 34.0–46.6)
Hemoglobin: 11.9 g/dL (ref 11.1–15.9)
Immature Grans (Abs): 0 10*3/uL (ref 0.0–0.1)
Immature Granulocytes: 0 %
Lymphocytes Absolute: 3.4 10*3/uL — ABNORMAL HIGH (ref 0.7–3.1)
Lymphs: 52 %
MCH: 26.3 pg — ABNORMAL LOW (ref 26.6–33.0)
MCHC: 31.7 g/dL (ref 31.5–35.7)
MCV: 83 fL (ref 79–97)
Monocytes Absolute: 0.4 10*3/uL (ref 0.1–0.9)
Monocytes: 6 %
Neutrophils Absolute: 2.6 10*3/uL (ref 1.4–7.0)
Neutrophils: 39 %
Platelets: 292 10*3/uL (ref 150–450)
RBC: 4.53 x10E6/uL (ref 3.77–5.28)
RDW: 15.6 % — ABNORMAL HIGH (ref 11.7–15.4)
WBC: 6.6 10*3/uL (ref 3.4–10.8)

## 2020-11-05 LAB — COMPREHENSIVE METABOLIC PANEL
ALT: 15 IU/L (ref 0–32)
AST: 14 IU/L (ref 0–40)
Albumin/Globulin Ratio: 1.3 (ref 1.2–2.2)
Albumin: 4.5 g/dL (ref 3.8–4.9)
Alkaline Phosphatase: 79 IU/L (ref 44–121)
BUN/Creatinine Ratio: 21 (ref 9–23)
BUN: 23 mg/dL (ref 6–24)
Bilirubin Total: 0.2 mg/dL (ref 0.0–1.2)
CO2: 26 mmol/L (ref 20–29)
Calcium: 9.4 mg/dL (ref 8.7–10.2)
Chloride: 104 mmol/L (ref 96–106)
Creatinine, Ser: 1.08 mg/dL — ABNORMAL HIGH (ref 0.57–1.00)
Globulin, Total: 3.5 g/dL (ref 1.5–4.5)
Glucose: 107 mg/dL — ABNORMAL HIGH (ref 65–99)
Potassium: 4.3 mmol/L (ref 3.5–5.2)
Sodium: 144 mmol/L (ref 134–144)
Total Protein: 8 g/dL (ref 6.0–8.5)
eGFR: 61 mL/min/{1.73_m2} (ref >59)

## 2020-11-05 LAB — LIPID PANEL W/O CHOL/HDL RATIO
Cholesterol, Total: 274 mg/dL — ABNORMAL HIGH (ref 100–199)
HDL: 29 mg/dL — ABNORMAL LOW (ref >39)
LDL Chol Calc (NIH): 150 mg/dL — ABNORMAL HIGH (ref 0–99)
Triglycerides: 498 mg/dL — ABNORMAL HIGH (ref 0–149)
VLDL Cholesterol Cal: 95 mg/dL — ABNORMAL HIGH (ref 5–40)

## 2020-11-19 ENCOUNTER — Ambulatory Visit: Payer: Self-pay | Admitting: Internal Medicine

## 2020-12-19 ENCOUNTER — Telehealth: Payer: Self-pay

## 2020-12-19 NOTE — Telephone Encounter (Signed)
Pharmacy called to inform that they no longer have Euthyrox. Instead they have [place] and [place]. They need approval to make the change

## 2020-12-24 NOTE — Telephone Encounter (Signed)
Pharmacy notified. Voicemail left with pt to call back and schedule

## 2020-12-24 NOTE — Telephone Encounter (Signed)
As discussed earlier, please send in one of them with same dosing as Euthyrox and schedult repeat TSH in 4 weeks.

## 2020-12-26 ENCOUNTER — Other Ambulatory Visit: Payer: Self-pay | Admitting: Internal Medicine

## 2020-12-26 DIAGNOSIS — Z79899 Other long term (current) drug therapy: Secondary | ICD-10-CM

## 2020-12-26 NOTE — Telephone Encounter (Signed)
Pt notified and scheduled.

## 2021-01-02 ENCOUNTER — Telehealth: Payer: Self-pay

## 2021-01-02 NOTE — Telephone Encounter (Signed)
Pharmacy called to inform that pt needs more refills of seroquel 100mg  and 300mg 

## 2021-01-08 ENCOUNTER — Ambulatory Visit: Payer: Self-pay | Admitting: Internal Medicine

## 2021-01-08 ENCOUNTER — Other Ambulatory Visit: Payer: Self-pay

## 2021-01-08 ENCOUNTER — Encounter: Payer: Self-pay | Admitting: Internal Medicine

## 2021-01-08 VITALS — BP 130/84 | HR 72 | Resp 12 | Ht 61.0 in | Wt 186.5 lb

## 2021-01-08 DIAGNOSIS — R739 Hyperglycemia, unspecified: Secondary | ICD-10-CM

## 2021-01-08 DIAGNOSIS — Z23 Encounter for immunization: Secondary | ICD-10-CM

## 2021-01-08 DIAGNOSIS — Z716 Tobacco abuse counseling: Secondary | ICD-10-CM

## 2021-01-08 DIAGNOSIS — E782 Mixed hyperlipidemia: Secondary | ICD-10-CM

## 2021-01-08 DIAGNOSIS — Z720 Tobacco use: Secondary | ICD-10-CM

## 2021-01-08 DIAGNOSIS — R7989 Other specified abnormal findings of blood chemistry: Secondary | ICD-10-CM

## 2021-01-08 DIAGNOSIS — F3170 Bipolar disorder, currently in remission, most recent episode unspecified: Secondary | ICD-10-CM

## 2021-01-08 DIAGNOSIS — L729 Follicular cyst of the skin and subcutaneous tissue, unspecified: Secondary | ICD-10-CM

## 2021-01-08 DIAGNOSIS — Z79899 Other long term (current) drug therapy: Secondary | ICD-10-CM

## 2021-01-08 DIAGNOSIS — E039 Hypothyroidism, unspecified: Secondary | ICD-10-CM

## 2021-01-08 NOTE — Progress Notes (Signed)
Subjective:    Patient ID: Meghan Contreras, female   DOB: November 18, 1965, 55 y.o.   MRN: [id number]   HPI  Has not been seen in 1 year.   Hyperlipidemia:    2.  Past 6 months, has discharge from her umbilicus.  Feels like a cyst in there.    3.  History of Heroin addition:  has been on Suboxone for 4 years.  Is becoming too costly as the grant that supported her ability to obtain at low cost is gone.  Was obtaining from [place] clinic in [place].  Has not looked at [facility].    4.  Tobacco abuse:  Has used Wellbutrin in past but had side effect of bad taste.  She has also had a seizure in the past.  She has not used Chantix, but when discussed changes in behavior and dreams with this med she was not interested.  She does not feel the nicotine patches will work for her as she is not willing to forgo cigarettes around and has a store right across the street.  Also her daughter smokes and is around her a lot.    5.  Hyperlipidemia:  Cholesterol not at goal.  Admits she is actually not taking Atorvastatin. Probably more than 1-2 years since last took.   Current Meds  Medication Sig   atorvastatin (LIPITOR) 40 MG tablet 1 tab by mouth daily with evening meal   buprenorphine-naloxone (SUBOXONE) 8-2 mg SUBL SL tablet Place 1 tablet under the tongue 2 (two) times daily.   EUTHYROX 112 MCG tablet TAKE 1 TABLET BY MOUTH ONCE DAILY 30  MINUTES  BEFORE  BREAKFAST  OR  OTHER  MEDS   prazosin (MINIPRESS) 2 MG capsule Take 1 capsule (2 mg total) by mouth at bedtime.   QUEtiapine (SEROQUEL) 100 MG tablet 1 tab by mouth in the morning   QUEtiapine (SEROQUEL) 300 MG tablet Take 1 tablet (300 mg total) by mouth at bedtime.   Allergies  Allergen Reactions   Amoxicillin Swelling   Latex Hives and Swelling     Review of Systems    Objective:   BP 130/84 (BP Location: Right Arm, Patient Position: Sitting, Cuff Size: Normal)   Pulse 72   Resp 12   Ht 5\' 1"  (1.549 m)   Wt 186 lb 8 oz (84.6 kg)   BMI 35.24 kg/m    Physical Exam NAD Deep, raspy voice unchanged. HEENT:  PERRL, EOMI, TMs pearly gray Neck:  Supple, No adenopathy.  Anterior low neck scar Chest:  CTA, some glottal noise with breathing. CV:  RRR with normal S1 and S2, No S3, S4 or murmur.  No carotid bruits.  Carotid, radial and DP pulses normal and equal Abd:  S, NT, No HSM  + BS.  Thickening, though no discrete cystic lesion just superior to umbilicus.  What may be a previously draining cutaneous opening at about 7 O'Clock next to umbilicus--4 mm skin colored healing papular lesion--unable to express anything with pressure over supraumbilical thickening or the papule. LE:  No edema.   Assessment & Plan    Postsurgical Hypothyroidism:  Just switched to Euthyroid about 1 month ago.  TSH to see if remains in therapeutic range.    2.  Hx of heroin addiction, on suboxone:  Given information to get set up locally with [facility].  Dicussed she will need to go daily to get suboxone.  I believe the med is without charge or very inexpensive.  3.  Tobacco abuse:  encouraged speaking with daughter about quitting together.  History of seizure in past, so not supportive of using Wellbutrin.  She is not interested in Chantix or Nicotine patches.  She is not willing to rid surroundings of smoking paraphernalia, so discussed she is not setting herself up for success.    4.  Hyperlipidemia:  restart Atorvastatin.  Fasting labs in 1 month  5.  Hyperglycemia:  A1C  6.   Elevated Creatinine:  BMP  7.  Umbilical lesions:  not clear if she has a supraumbilical subcutaneous cystic lesion that is intermittently draining through the lower papular lesion described.  To call when she has actual drainage to get a better idea.    8.  Bipolar disorder:  On seroquel and prazosin (nightmares).  She is not seeing psych.  Discussed again, she needs to be established with a mental health provider who will continue her meds, the agreement was for me to write temporarily.   Gave her [facility] and [employer] contacts.  9.  HM:  Pfizer bivalent booster and Influenza.   Thinking about Shingles vaccine.  7.

## 2021-01-08 NOTE — Patient Instructions (Addendum)
  [facility] [place], [place] [address] Office: [phone]  Fax: [phone]  [facility] [address], [place], [place] [address] Hours:  Open 24 hours Phone: [phone]  [facility] Get online care: [email or web address] Address: [place], [place], [place] [address] Hours:  Open ? Closes 7PM Phone: [phone]

## 2021-01-08 NOTE — Telephone Encounter (Signed)
Pt scheduled 01/08/21

## 2021-01-09 LAB — BASIC METABOLIC PANEL
BUN/Creatinine Ratio: 13 (ref 9–23)
BUN: 14 mg/dL (ref 6–24)
CO2: 27 mmol/L (ref 20–29)
Calcium: 9.3 mg/dL (ref 8.7–10.2)
Chloride: 103 mmol/L (ref 96–106)
Creatinine, Ser: 1.09 mg/dL — ABNORMAL HIGH (ref 0.57–1.00)
Glucose: 83 mg/dL (ref 70–99)
Potassium: 4.1 mmol/L (ref 3.5–5.2)
Sodium: 142 mmol/L (ref 134–144)
eGFR: 60 mL/min/{1.73_m2} (ref >59)

## 2021-01-09 LAB — TSH: TSH: 16.5 u[IU]/mL — ABNORMAL HIGH (ref 0.450–4.500)

## 2021-01-09 LAB — HGB A1C W/O EAG: Hgb A1c MFr Bld: 5.6 % (ref 4.8–5.6)

## 2021-01-09 MED ORDER — QUETIAPINE FUMARATE 300 MG PO TABS: 300.0000 mg | ORAL_TABLET | Freq: Every day | ORAL | 6 refills | Status: DC

## 2021-01-09 MED ORDER — LEVOTHYROXINE SODIUM 125 MCG PO TABS: ORAL_TABLET | ORAL | 11 refills | Status: DC

## 2021-01-09 MED ORDER — ATORVASTATIN CALCIUM 40 MG PO TABS: ORAL_TABLET | ORAL | 11 refills | Status: DC

## 2021-01-09 MED ORDER — PRAZOSIN HCL 2 MG PO CAPS: 2.0000 mg | ORAL_CAPSULE | Freq: Every day | ORAL | 11 refills | Status: DC

## 2021-01-09 MED ORDER — QUETIAPINE FUMARATE 100 MG PO TABS: ORAL_TABLET | ORAL | 6 refills | Status: DC

## 2021-01-10 DIAGNOSIS — R7989 Other specified abnormal findings of blood chemistry: Secondary | ICD-10-CM | POA: Insufficient documentation

## 2021-01-10 DIAGNOSIS — F3170 Bipolar disorder, currently in remission, most recent episode unspecified: Secondary | ICD-10-CM | POA: Insufficient documentation

## 2021-01-12 ENCOUNTER — Telehealth: Payer: Self-pay

## 2021-01-12 NOTE — Telephone Encounter (Signed)
Pharmacy called to inform that they do not carry prazosin. We will need to send it to a different pharmacy.  They also want to let Dr. Amil Amen know that she did not pick up atorvastatin. Pt told the pharmacy that she could not afford it. She also did not pick it up last month.

## 2021-01-14 ENCOUNTER — Other Ambulatory Visit: Payer: Self-pay

## 2021-01-14 DIAGNOSIS — Z79899 Other long term (current) drug therapy: Secondary | ICD-10-CM

## 2021-01-14 MED ORDER — PRAZOSIN HCL 2 MG PO CAPS: 2.0000 mg | ORAL_CAPSULE | Freq: Every day | ORAL | 11 refills | Status: DC

## 2021-01-14 NOTE — Telephone Encounter (Signed)
Plans to pick up atorvastatin upcoming Monday. Did not have enough money when she went to the pharmacy, but has the money now. Would like the prazosin sent to [facility] on [address]

## 2021-01-14 NOTE — Telephone Encounter (Signed)
Voicemail left to call back 

## 2021-01-28 ENCOUNTER — Other Ambulatory Visit: Payer: Self-pay

## 2021-02-04 ENCOUNTER — Other Ambulatory Visit: Payer: Self-pay

## 2021-02-21 IMAGING — DX DG CHEST 1V PORT
1 series · 1 of 1 positions shown · non-contrast
Comparison: August 04, 2018

CLINICAL DATA: Shortness of breath

EXAM:
PORTABLE CHEST 1 VIEW

[chest ap]
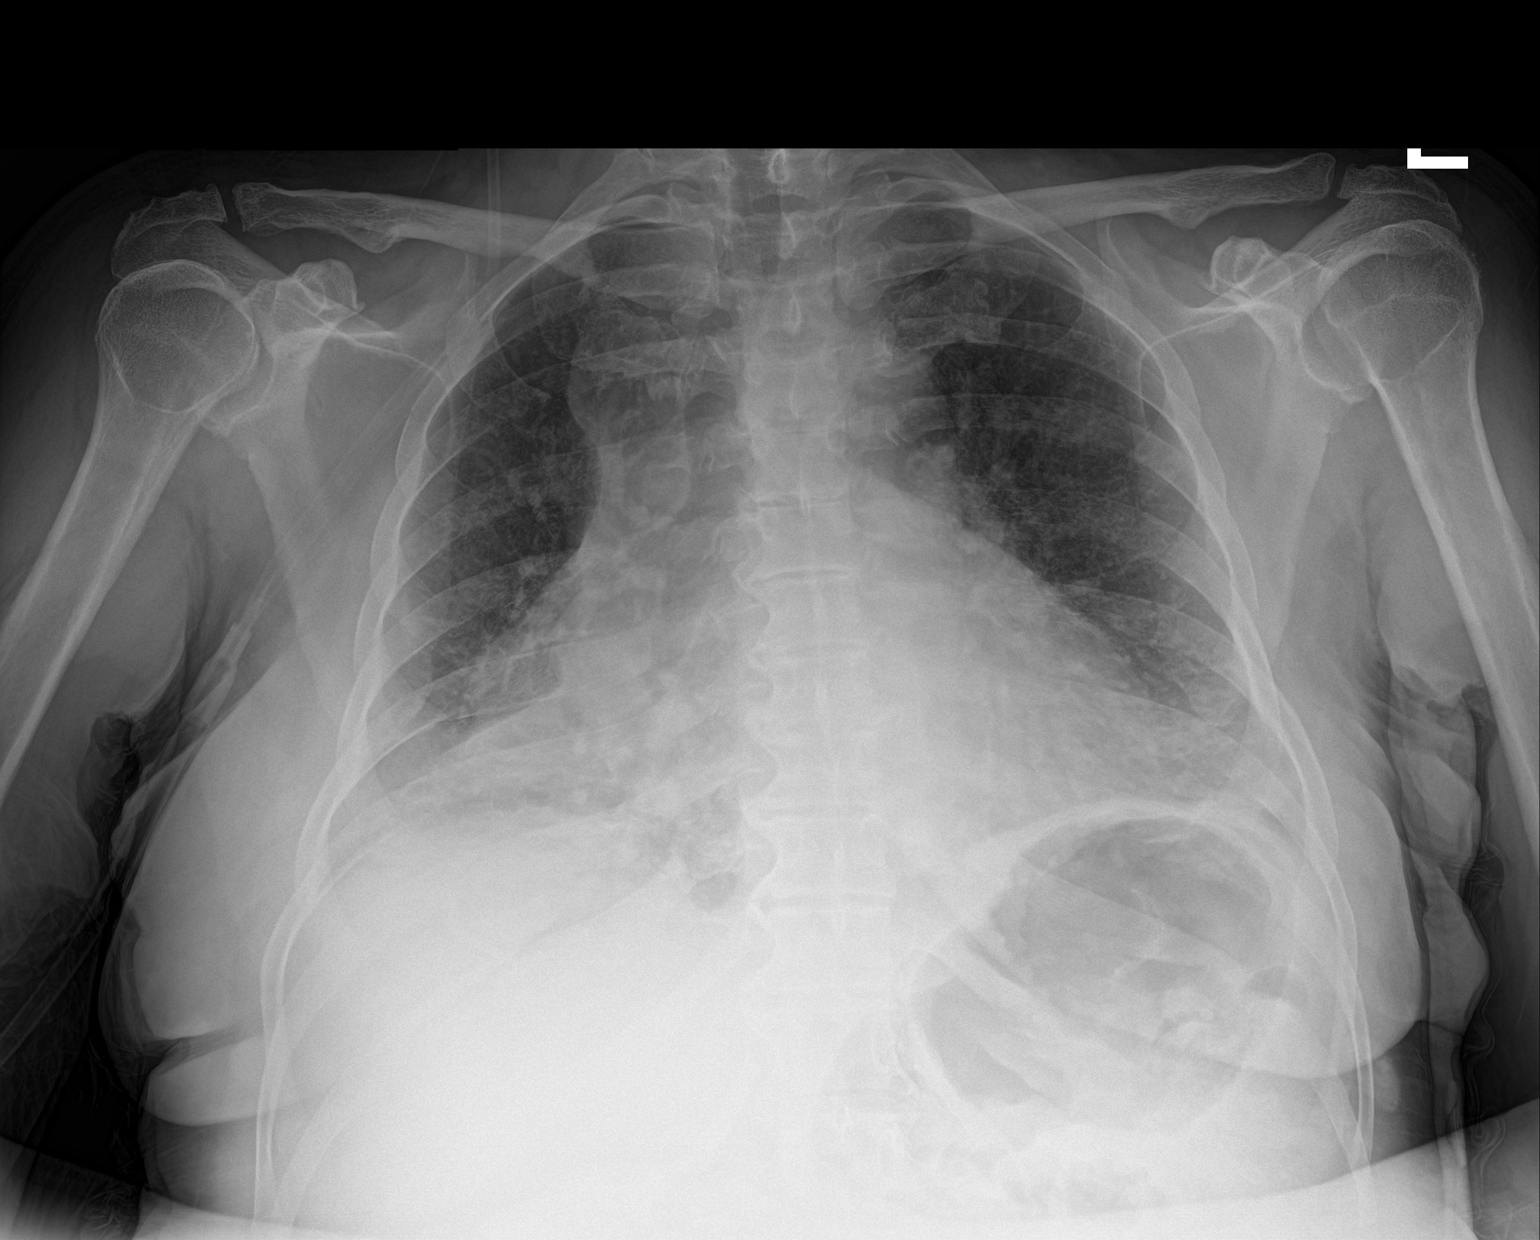

[1 of 1 positions shown; findings below may reference images not displayed]

FINDINGS: Stable cardiomegaly. The hila and mediastinum are unchanged. No
pneumothorax. Bibasilar opacities are stable, possibly atelectasis.
IMPRESSION: Bibasilar opacities are stable, possibly atelectasis. No other
interval changes.

## 2021-04-24 ENCOUNTER — Telehealth: Payer: Self-pay

## 2021-04-24 NOTE — Telephone Encounter (Signed)
Pharmacy called to inform of concerns that patient has requested Seroquel refills earlier each month. Rx should last patient until the 14th of each month based on when she picked it up the first time (01/12/21), but has requested it sooner saying that she needs it because she is going out of town. Last request was on 04/02/21 and current request was on 04/24/21.

## 2021-05-14 ENCOUNTER — Other Ambulatory Visit (INDEPENDENT_AMBULATORY_CARE_PROVIDER_SITE_OTHER): Payer: Self-pay

## 2021-05-14 ENCOUNTER — Other Ambulatory Visit: Payer: Self-pay

## 2021-05-14 DIAGNOSIS — E039 Hypothyroidism, unspecified: Secondary | ICD-10-CM

## 2021-05-15 LAB — TSH: TSH: 8.64 u[IU]/mL — ABNORMAL HIGH (ref 0.450–4.500)

## 2021-05-19 ENCOUNTER — Encounter: Payer: Self-pay | Admitting: Internal Medicine

## 2021-05-19 ENCOUNTER — Ambulatory Visit: Payer: Self-pay | Admitting: Internal Medicine

## 2021-05-19 ENCOUNTER — Other Ambulatory Visit: Payer: Self-pay

## 2021-05-19 VITALS — BP 134/82 | HR 84 | Resp 16 | Ht 61.0 in | Wt 179.0 lb

## 2021-05-19 DIAGNOSIS — Z79899 Other long term (current) drug therapy: Secondary | ICD-10-CM

## 2021-05-19 DIAGNOSIS — F4312 Post-traumatic stress disorder, chronic: Secondary | ICD-10-CM

## 2021-05-19 DIAGNOSIS — F4310 Post-traumatic stress disorder, unspecified: Secondary | ICD-10-CM

## 2021-05-19 DIAGNOSIS — F3170 Bipolar disorder, currently in remission, most recent episode unspecified: Secondary | ICD-10-CM

## 2021-05-19 DIAGNOSIS — F515 Nightmare disorder: Secondary | ICD-10-CM

## 2021-05-19 MED ORDER — QUETIAPINE FUMARATE 300 MG PO TABS: 300.0000 mg | ORAL_TABLET | Freq: Every day | ORAL | 0 refills | Status: DC

## 2021-05-19 MED ORDER — PRAZOSIN HCL 2 MG PO CAPS: 2.0000 mg | ORAL_CAPSULE | Freq: Every day | ORAL | 11 refills | Status: DC

## 2021-05-19 MED ORDER — LEVOTHYROXINE SODIUM 137 MCG PO TABS: 137.0000 ug | ORAL_TABLET | Freq: Every day | ORAL | 11 refills | Status: DC

## 2021-05-19 MED ORDER — QUETIAPINE FUMARATE 100 MG PO TABS: ORAL_TABLET | ORAL | 0 refills | Status: DC

## 2021-05-19 NOTE — Telephone Encounter (Signed)
Pharmacy called again to inform that patient requested Seroquel earlier today. They shared their concern and mentioned pt should have enough to last her until 06/19/21.  ?

## 2021-05-19 NOTE — Progress Notes (Signed)
    Subjective:    Patient ID: Meghan Contreras, female   DOB: June 26, 1965, 56 y.o.   MRN: [id number]   HPI   Bipolar Disorder:  States when she ran out of minipress about 2-3 months ago due to cost, she started having nightmares and concerns she was dying of different illnesses.  She started taking more of her Seroquel as she was not feeling well.   PTSD.  Has been filling her Seroquel too early on a regular basis at [facility] and the pharmacy contacted [place].  They will not fill until her next Rx is due at this point.  Has given multiple reasons in past for why she is filling too early/frequently.  2.  Hypothyroidism:  recent TSH with change in generic medication with continued mild increase in TSH.  She is willing for now to just increase her dose of new generic.    Current Meds  Medication Sig   atorvastatin (LIPITOR) 40 MG tablet 1 tab by mouth daily with evening meal   buprenorphine-naloxone (SUBOXONE) 8-2 mg SUBL SL tablet Place 1 tablet under the tongue 2 (two) times daily.   levothyroxine (EUTHYROX) 125 MCG tablet 1 tab by  mouth once daily 1/2 hour before food in morning   QUEtiapine (SEROQUEL) 100 MG tablet 1 tab by mouth in the morning   QUEtiapine (SEROQUEL) 300 MG tablet Take 1 tablet (300 mg total) by mouth at bedtime.   Allergies  Allergen Reactions   Amoxicillin Swelling   Latex Hives and Swelling     Review of Systems    Objective:   BP 134/82 (BP Location: Right Arm, Patient Position: Sitting, Cuff Size: Normal)   Pulse 84   Resp 16   Ht '5\' 1"'$  (1.549 m)   Wt 179 lb (81.2 kg)   BMI 33.82 kg/m   Physical Exam NAD Deep hoarse voice Neck:  Supple, No adenopathy Lungs:  CTA CV:  RRR without murmur or rub.  Radial pulses normal and equal Abd:  S NT, No HSM or mass, + BS LE:  No edema.   Assessment & Plan   PTSD with associated nightmares/Bipolar disorder:  discussed in future, she should not make medication changes on her own, but to call if running out of a  med.  However, she has been asked for some time to get established with psychiatry for specialized treatment I cannot provide.   I will write for her Prazosin to decrease the nightmares she is experiencing and she will go back to previous dosing of seroquel that was ordered. [facility] She can then return back to prescription at [facility] Referral to [facility]ry at [facility] Discussed would not fill Seroquel again if has not made an appt.  2.  Hypothyroidism:  increase current generic levothyroxine to 137 mcg daily.  TSH in 6 weeks with follow up.

## 2021-05-19 NOTE — Patient Instructions (Signed)
[facility] ?Mental health clinic in [place], [place] ?Address: [address], [place], [place] [address] ?Hours:  ?Open 24 hours ?Phone: [phone]  ? ?I have sent a referral to [facility] across from [place].   ? ?[employer] ?Get online care: [email or web address] ?Address: [address] [place], [place] [address] ?Hours:  ?Open ? Closes 7PM ?Phone: [phone]  ? ?[facility] ?Mental health clinic in [place], [place] ?Address: Restaurant manager, fast food at [facility], [address] [place], [place], [place] [address] ?Hours:  ?Closes soon ? 5?PM ? Opens 8?AM Wed ?Phone: [phone] ? ?

## 2021-05-20 ENCOUNTER — Encounter: Payer: Self-pay | Admitting: Internal Medicine

## 2021-06-15 ENCOUNTER — Telehealth: Payer: Self-pay

## 2021-06-15 NOTE — Telephone Encounter (Signed)
Patient would like refills for Seroquel, last current dose should be on Thursday 06/18/21. If she is able to get refill, should would like it at [facility] on [address] because her [place] is expired. ?

## 2021-06-26 NOTE — Telephone Encounter (Signed)
Attempted to call patient to discuss refill request further, unable to reach her. Unable to leave voicemail. Attempted to call patient contacts, but was unable to reach anyone ?

## 2021-07-22 ENCOUNTER — Ambulatory Visit: Payer: Self-pay | Admitting: Internal Medicine

## 2021-08-12 ENCOUNTER — Telehealth: Payer: Self-pay

## 2021-08-12 NOTE — Telephone Encounter (Signed)
Patient called asking for refill on her Seroquel. Patient reported that she has attempted to get therapy, but has not heard back from offices. Recently has contacted [facility] and was told that they would call her back. However she has not heard back.

## 2021-08-13 ENCOUNTER — Other Ambulatory Visit: Payer: Self-pay | Admitting: Internal Medicine

## 2021-08-13 DIAGNOSIS — Z79899 Other long term (current) drug therapy: Secondary | ICD-10-CM

## 2021-08-14 NOTE — Telephone Encounter (Signed)
1 month of seroquel has been refilled. Will call to confirm patients therapy appointment before giving further refills

## 2021-08-17 NOTE — Telephone Encounter (Signed)
This is her last fill until she has an appt.  We need to call and confirm the appt.  She has had ample time to make an appt.

## 2021-08-20 NOTE — Telephone Encounter (Signed)
[facility] and confirmed that patient has an appointment with them on august 9

## 2021-09-02 ENCOUNTER — Encounter: Payer: Self-pay | Admitting: Internal Medicine

## 2021-09-02 ENCOUNTER — Ambulatory Visit: Payer: Self-pay | Admitting: Internal Medicine

## 2021-09-02 VITALS — BP 124/80 | HR 92 | Resp 12 | Ht 61.0 in | Wt 181.0 lb

## 2021-09-02 DIAGNOSIS — J4489 Other specified chronic obstructive pulmonary disease: Secondary | ICD-10-CM | POA: Insufficient documentation

## 2021-09-02 DIAGNOSIS — Z716 Tobacco abuse counseling: Secondary | ICD-10-CM

## 2021-09-02 DIAGNOSIS — E039 Hypothyroidism, unspecified: Secondary | ICD-10-CM

## 2021-09-02 DIAGNOSIS — F4310 Post-traumatic stress disorder, unspecified: Secondary | ICD-10-CM

## 2021-09-02 DIAGNOSIS — F3170 Bipolar disorder, currently in remission, most recent episode unspecified: Secondary | ICD-10-CM

## 2021-09-02 DIAGNOSIS — E782 Mixed hyperlipidemia: Secondary | ICD-10-CM

## 2021-09-02 DIAGNOSIS — F4312 Post-traumatic stress disorder, chronic: Secondary | ICD-10-CM | POA: Insufficient documentation

## 2021-09-02 DIAGNOSIS — F515 Nightmare disorder: Secondary | ICD-10-CM

## 2021-09-02 DIAGNOSIS — J449 Chronic obstructive pulmonary disease, unspecified: Secondary | ICD-10-CM

## 2021-09-02 DIAGNOSIS — Z720 Tobacco use: Secondary | ICD-10-CM

## 2021-09-02 MED ORDER — TRELEGY ELLIPTA 100-62.5-25 MCG/ACT IN AEPB
1.0000 | INHALATION_SPRAY | Freq: Every day | RESPIRATORY_TRACT | 11 refills | Status: DC
Start: 2021-09-02 — End: 2022-06-22

## 2021-09-02 MED ORDER — PRAZOSIN HCL 2 MG PO CAPS: ORAL_CAPSULE | ORAL | 11 refills | Status: DC

## 2021-09-02 MED ORDER — ALBUTEROL SULFATE HFA 108 (90 BASE) MCG/ACT IN AERS: INHALATION_SPRAY | RESPIRATORY_TRACT | 2 refills | Status: DC

## 2021-09-02 NOTE — Progress Notes (Signed)
    Subjective:    Patient ID: Meghan Contreras, female   DOB: [birth year], 56 y.o.   MRN: [id number]   HPI  Here with her long term partner, who is in and out--spends much of time in [place], [place] and [place].  He is a Tax adviser.   COPD:  Has been using Albuterol 1-2 times weekly, but feels she needs it more often.  Smoking 1 ppd. Has a 40 pack year history.   Too stressed to consider stopping.  She smokes inside and her boyfriend is exposed.    2.  PTSD/Nightmares:  Was taking Prazosin 4 mg nightly when getting Rx elsewhere and better controlled nightmares.  2 mg is not helping.  Having nightmares almost nightly, which flares her PTSD symptoms.  She has finally made an appt with [facility] for August.  They will take over her Seroquel and Prazosin Rxs.    3.  Hypothyroidism, postsurgical:  not clear if increased her levothyroxine.  Due for TSH.   4.  Hyperlipidemia:  Has not had recheck since Sept of last year and was still quite elevated.  She had stopped taking Atorvastatin when seen in November and has not returned since restarted for recheck of labs    Current Meds  Medication Sig   atorvastatin (LIPITOR) 40 MG tablet 1 tab by mouth daily with evening meal   buprenorphine-naloxone (SUBOXONE) 8-2 mg SUBL SL tablet Place 1 tablet under the tongue 2 (two) times daily.   levothyroxine (SYNTHROID) 137 MCG tablet Take 1 tablet (137 mcg total) by mouth daily before breakfast.   prazosin (MINIPRESS) 2 MG capsule Take 1 capsule (2 mg total) by mouth at bedtime.   QUEtiapine (SEROQUEL) 100 MG tablet TAKE 1 TABLET BY MOUTH EVERY MORNING   QUEtiapine (SEROQUEL) 300 MG tablet TAKE 1 TABLET BY MOUTH AT BEDTIME.   Allergies  Allergen Reactions   Amoxicillin Swelling   Latex Hives and Swelling     Review of Systems    Objective:   BP 124/80 (BP Location: Right Arm, Patient Position: Sitting, Cuff Size: Normal)   Pulse 92   Resp 12   Ht '5\' 1"'$  (1.549 m)    Wt 181 lb (82.1 kg)   BMI 34.20 kg/m   Physical Exam NAD, baseline raspy voice. HEENT: PERRL, EOMI Decreased BS throughout, but upper throat airway noise as welll  Assessment & Plan    COPD and tobacco abuse:  urged her to rethink tobacco cessation.  [place] she is not ready to consider with her stress levels.  Discussed options.  [facility] for Trelegy ellipta at [facility].  Handout on how [facility] works given.  Not clear she will follow through, though partner states he will help her with this.    2.  PTSD with nightmares/Bipolar Disorder:  will increase Prazosin to 4 mg at bedtime.  She states she has upcoming appt with Behavioral Health in August and will ask them to take over her medications for these health concerns.    3.  Hyperlipidemia:  to return in next week for fasting labs.  4.  Hypothyroidism:  TSH with fasting labs in 1 week.  Schedule for CPE in next 4 months.

## 2021-09-09 ENCOUNTER — Other Ambulatory Visit: Payer: Self-pay | Admitting: Internal Medicine

## 2021-09-09 DIAGNOSIS — Z79899 Other long term (current) drug therapy: Secondary | ICD-10-CM

## 2021-09-12 ENCOUNTER — Other Ambulatory Visit: Payer: Self-pay

## 2021-09-12 ENCOUNTER — Encounter (HOSPITAL_COMMUNITY): Payer: Self-pay

## 2021-09-12 ENCOUNTER — Emergency Department (HOSPITAL_COMMUNITY)
Admission: EM | Admit: 2021-09-12 | Discharge: 2021-09-12 | Disposition: A | Discharge status: Home / Self Care | Payer: Self-pay | Attending: Emergency Medicine | Admitting: Emergency Medicine

## 2021-09-12 DIAGNOSIS — Z7951 Long term (current) use of inhaled steroids: Secondary | ICD-10-CM | POA: Insufficient documentation

## 2021-09-12 DIAGNOSIS — J449 Chronic obstructive pulmonary disease, unspecified: Secondary | ICD-10-CM | POA: Insufficient documentation

## 2021-09-12 DIAGNOSIS — Y900 Blood alcohol level of less than 20 mg/100 ml: Secondary | ICD-10-CM | POA: Insufficient documentation

## 2021-09-12 DIAGNOSIS — F515 Nightmare disorder: Secondary | ICD-10-CM

## 2021-09-12 DIAGNOSIS — F32A Depression, unspecified: Secondary | ICD-10-CM

## 2021-09-12 DIAGNOSIS — R45851 Suicidal ideations: Secondary | ICD-10-CM | POA: Insufficient documentation

## 2021-09-12 DIAGNOSIS — E039 Hypothyroidism, unspecified: Secondary | ICD-10-CM | POA: Insufficient documentation

## 2021-09-12 DIAGNOSIS — Z91040 Latex allergy status: Secondary | ICD-10-CM | POA: Insufficient documentation

## 2021-09-12 DIAGNOSIS — F3170 Bipolar disorder, currently in remission, most recent episode unspecified: Secondary | ICD-10-CM | POA: Diagnosis present

## 2021-09-12 DIAGNOSIS — R739 Hyperglycemia, unspecified: Secondary | ICD-10-CM | POA: Insufficient documentation

## 2021-09-12 DIAGNOSIS — F4312 Post-traumatic stress disorder, chronic: Secondary | ICD-10-CM | POA: Diagnosis present

## 2021-09-12 DIAGNOSIS — Z79899 Other long term (current) drug therapy: Secondary | ICD-10-CM | POA: Insufficient documentation

## 2021-09-12 LAB — CBC
HCT: 37.3 % (ref 36.0–46.0)
Hemoglobin: 12 g/dL (ref 12.0–15.0)
MCH: 26.3 pg (ref 26.0–34.0)
MCHC: 32.2 g/dL (ref 30.0–36.0)
MCV: 81.6 fL (ref 80.0–100.0)
Platelets: 356 10*3/uL (ref 150–400)
RBC: 4.57 MIL/uL (ref 3.87–5.11)
RDW: 13.4 % (ref 11.5–15.5)
WBC: 8.4 10*3/uL (ref 4.0–10.5)
nRBC: 0 % (ref 0.0–0.2)

## 2021-09-12 LAB — COMPREHENSIVE METABOLIC PANEL
ALT: 28 U/L (ref 0–44)
AST: 25 U/L (ref 15–41)
Albumin: 4.2 g/dL (ref 3.5–5.0)
Alkaline Phosphatase: 69 U/L (ref 38–126)
Anion gap: 11 (ref 5–15)
BUN: 15 mg/dL (ref 6–20)
CO2: 23 mmol/L (ref 22–32)
Calcium: 9.4 mg/dL (ref 8.9–10.3)
Chloride: 108 mmol/L (ref 98–111)
Creatinine, Ser: 1.03 mg/dL — ABNORMAL HIGH (ref 0.44–1.00)
GFR, Estimated: >60 mL/min (ref >60)
Glucose, Bld: 133 mg/dL — ABNORMAL HIGH (ref 70–99)
Potassium: 3.7 mmol/L (ref 3.5–5.1)
Sodium: 142 mmol/L (ref 135–145)
Total Bilirubin: 0.7 mg/dL (ref 0.3–1.2)
Total Protein: 8.2 g/dL — ABNORMAL HIGH (ref 6.5–8.1)

## 2021-09-12 LAB — RAPID URINE DRUG SCREEN, HOSP PERFORMED
Amphetamines: NOT DETECTED
Barbiturates: NOT DETECTED
Benzodiazepines: NOT DETECTED
Cocaine: NOT DETECTED
Opiates: NOT DETECTED
Tetrahydrocannabinol: NOT DETECTED

## 2021-09-12 LAB — SALICYLATE LEVEL: Salicylate Lvl: <7 mg/dL — ABNORMAL LOW (ref 7.0–30.0)

## 2021-09-12 LAB — ACETAMINOPHEN LEVEL: Acetaminophen (Tylenol), Serum: <10 ug/mL — ABNORMAL LOW (ref 10–30)

## 2021-09-12 LAB — I-STAT BETA HCG BLOOD, ED (MC, WL, AP ONLY): I-stat hCG, quantitative: <5 m[IU]/mL (ref <5)

## 2021-09-12 LAB — ETHANOL: Alcohol, Ethyl (B): <10 mg/dL (ref <10)

## 2021-09-12 MED ORDER — ACETAMINOPHEN 325 MG PO TABS: 650.0000 mg | ORAL_TABLET | ORAL | Status: DC | PRN

## 2021-09-12 MED ORDER — LEVOTHYROXINE SODIUM 25 MCG PO TABS: 137.0000 ug | ORAL_TABLET | Freq: Every day | ORAL | Status: DC

## 2021-09-12 MED ORDER — PRAZOSIN HCL 2 MG PO CAPS
2.0000 mg | ORAL_CAPSULE | Freq: Every day | ORAL | Status: DC
  Filled 2021-09-12: qty 1

## 2021-09-12 MED ORDER — QUETIAPINE FUMARATE 300 MG PO TABS: 300.0000 mg | ORAL_TABLET | Freq: Every day | ORAL | 0 refills | Status: DC

## 2021-09-12 MED ORDER — ZOLPIDEM TARTRATE 5 MG PO TABS: 5.0000 mg | ORAL_TABLET | Freq: Every evening | ORAL | Status: DC | PRN

## 2021-09-12 MED ORDER — ALBUTEROL SULFATE HFA 108 (90 BASE) MCG/ACT IN AERS: 2.0000 | INHALATION_SPRAY | RESPIRATORY_TRACT | Status: DC | PRN

## 2021-09-12 MED ORDER — QUETIAPINE FUMARATE 100 MG PO TABS
100.0000 mg | ORAL_TABLET | Freq: Every morning | ORAL | Status: DC
  Administered 2021-09-12: 100 mg via ORAL
  Filled 2021-09-12: qty 1

## 2021-09-12 MED ORDER — QUETIAPINE FUMARATE 100 MG PO TABS: 100.0000 mg | ORAL_TABLET | Freq: Every morning | ORAL | 0 refills | Status: DC

## 2021-09-12 NOTE — ED Notes (Signed)
Pt verbalized understanding of discharge instructions. Opportunity for questions provided.  Pt educated on [facility].

## 2021-09-12 NOTE — ED Provider Notes (Signed)
Pt has been psych cleared.  IVC rescinded.   Isla Pence, MD 09/12/21 1739

## 2021-09-12 NOTE — Consult Note (Signed)
Meghan Contreras ASSESSMENT   Reason for Consult:  SI Referring Physician:  Dr. Jeanell Sparrow Patient Identification: Meghan Contreras MRN:  [id number] Contreras Chief Complaint: Nightmares associated with chronic post-traumatic stress disorder  Diagnosis:  Principal Problem:   Nightmares associated with chronic post-traumatic stress disorder Active Problems:   Bipolar affective disorder in remission [facility])   Contreras Assessment Time Calculation: Start Time: 1630 Stop Time: 1705 Total Time in Minutes (Assessment Completion): 35   Subjective:   Meghan Contreras is a 56 y.o. female patient who voluntarily presents to [facility] due to SI, no specific plan or intent. Pt has reported hx of bipolar disorder and PTSD  HPI:   Patient seen today at [facility] for face to face evaluation. She tells me she has not had her Seroquel for two days, and she is feeling anxious, depressed, and not sleeping well. Pt is unable to get her Seroquel refilled until 09/17/21. I asked if she is having suicidal thoughts due to not having her medications. She stated "I think so. I am just not feeling right I didn't know what to do." I asked patient if I sent her Seroquel in for the next 5 days until her refill if she would feel safe for discharge. Pt stated "oh my goodness yes. I'm not suicidal I would never hurt myself I was just feeling funny but I would love to go back home I just need that Seroquel I haven't been sleeping well and I just want to feel like myself again." Pt denies suicidal thoughts, denies plan or intent. She is able to contract for safety. Mentions safety factors such as her boyfriend, children, and grandchildren. She denies any homicidal ideations. Denies auditory or visual hallucinations. Denies substance/ETOH abuse, has been sober for around 5 years. She lives alone at [facility]. She has a long term boyfriend who she says is very supportive, but he is not always around due to work. She mentions she has had increased anxiety during this time since  he is away.   Will send 5 day supply of Seroquel 100 mg po morning and 300 mg po at bed time to [facility] on [address]. Pharmacist verified prescription, and stated pt has plenty of refills on Prazosin so that is not necessary at this time. Pt is psychiatrically cleared for d/c.   Past Psychiatric History:  - reported hx of bipolar disorder, PTSD, and nightmares associated with PTSD - Pt has appt. On August 9th at [facility] OP   Risk to Self or Others: Is the patient at risk to self? No Has the patient been a risk to self in the past 6 months? No Has the patient been a risk to self within the distant past? No Is the patient a risk to others? No Has the patient been a risk to others in the past 6 months? No Has the patient been a risk to others within the distant past? No  [place] Scale:  [place] Contreras from 09/12/2021 in [place] No Risk        Past Medical History:  Past Medical History:  Diagnosis Date   Anxiety    Depression    HOH (hard of hearing)    Hyperlipidemia 2021   Hyperthyroidism    Opiate addiction ([place])    On chronic suboxone since 2017 this last go around.     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  Old records from [place] with CT of soft tissue of chest and neck 11/2017:  No findings to suggest cause of stridorous breathing and no obvious lung disease.      Past Surgical History:  Procedure Laterality Date   THYROIDECTOMY  2019   Family History:  Family History  Problem Relation Age of Onset   HIV/AIDS Mother        from drug abuse   Drug abuse Mother    Drug abuse Brother    Hepatitis C Brother        From IV drug abuse   Drug abuse Sister    Drug abuse Sister    Drug abuse Sister    Drug abuse Sister    Drug abuse Sister    Cancer Sister        lung   Drug abuse Sister      Social History:  Social History   Substance and Sexual Activity  Alcohol Use No     Social History   Substance and Sexual Activity  Drug Use Yes   Comment: Benzodiazepines / Klonopin/opiate addiction.  Also history of crack cocaine abuse.  2021 clean for 2017 on Suboxone.     Social History   Socioeconomic History   Marital status: Legally Separated    Spouse name: Not on file   Number of children: 2   Years of education: Not on file   Highest education level: Some college, no degree  Occupational History   Occupation: unemployed    Comment: Supported by daughter, ex husband and boyfriend.  Tobacco Use   Smoking status: Every Day    Packs/day: 1.00    Years: 42.00    Total pack years: 42.00    Types: Cigarettes   Smokeless tobacco: Never  Substance and Sexual Activity   Alcohol use: No   Drug use: Yes    Comment: Benzodiazepines / Klonopin/opiate addiction.  Also history of crack cocaine abuse.  2021 clean for 2017 on Suboxone.    Sexual activity: Not Currently  Other Topics Concern   Not on file  Social History Narrative   Lives alone, though her boyfriend will visit for 2 months at a time.   Social Determinants of Health   Financial Resource Strain: Not on file  Food Insecurity: Not on file  Transportation Needs: Not on file  Physical Activity: Not on file  Stress: Not on file  Social Connections: Not on file   Additional Social History:    Allergies:   Allergies  Allergen Reactions   Amoxicillin Swelling   Latex Hives and Swelling    Labs:  Results for orders placed or performed during the hospital encounter of 09/12/21 (from the past 48 hour(s))  Rapid urine drug screen (hospital performed)     Status: None   Collection Time: 09/12/21 11:52 AM  Result Value Ref Range   Opiates NONE DETECTED NONE DETECTED   Cocaine NONE DETECTED NONE DETECTED   Benzodiazepines NONE DETECTED NONE DETECTED   Amphetamines NONE DETECTED NONE DETECTED    Tetrahydrocannabinol NONE DETECTED NONE DETECTED   Barbiturates NONE DETECTED NONE DETECTED    Comment: (NOTE) DRUG SCREEN FOR MEDICAL PURPOSES ONLY.  IF CONFIRMATION IS NEEDED FOR ANY PURPOSE, NOTIFY LAB WITHIN 5 DAYS.  LOWEST DETECTABLE LIMITS FOR URINE DRUG SCREEN Drug Class                     Cutoff (ng/mL) Amphetamine and metabolites  1000 Barbiturate and metabolites    200 Benzodiazepine                 937 Tricyclics and metabolites     300 Opiates and metabolites        300 Cocaine and metabolites        300 THC                            50 Performed at [facility], [place] [address] [place], [place] [address]   Comprehensive metabolic panel     Status: Abnormal   Collection Time: 09/12/21 11:58 AM  Result Value Ref Range   Sodium 142 135 - 145 mmol/L   Potassium 3.7 3.5 - 5.1 mmol/L   Chloride 108 98 - 111 mmol/L   CO2 23 22 - 32 mmol/L   Glucose, Bld 133 (H) 70 - 99 mg/dL    Comment: Glucose reference range applies only to samples taken after fasting for at least 8 hours.   BUN 15 6 - 20 mg/dL   Creatinine, Ser 1.03 (H) 0.44 - 1.00 mg/dL   Calcium 9.4 8.9 - 10.3 mg/dL   Total Protein 8.2 (H) 6.5 - 8.1 g/dL   Albumin 4.2 3.5 - 5.0 g/dL   AST 25 15 - 41 U/L   ALT 28 0 - 44 U/L   Alkaline Phosphatase 69 38 - 126 U/L   Total Bilirubin 0.7 0.3 - 1.2 mg/dL   GFR, Estimated >60 >60 mL/min    Comment: (NOTE) Calculated using the CKD-EPI Creatinine Equation (2021)    Anion gap 11 5 - 15    Comment: Performed at [facility], [place] [address], [place], [place] [address]  Ethanol     Status: None   Collection Time: 09/12/21 11:58 AM  Result Value Ref Range   Alcohol, Ethyl (B) <10 <10 mg/dL    Comment: (NOTE) Lowest detectable limit for serum alcohol is 10 mg/dL.  For medical purposes only. Performed at [facility], [place] [address] [place], [place] [address]   Salicylate level     Status: Abnormal   Collection Time: 09/12/21 11:58 AM   Result Value Ref Range   Salicylate Lvl <0.2 (L) 7.0 - 30.0 mg/dL    Comment: Performed at [facility] [address], [place], [place] [address]  Acetaminophen level     Status: Abnormal   Collection Time: 09/12/21 11:58 AM  Result Value Ref Range   Acetaminophen (Tylenol), Serum <10 (L) 10 - 30 ug/mL    Comment: (NOTE) Therapeutic concentrations vary significantly. A range of 10-30 ug/mL  may be an effective concentration for many patients. However, some  are best treated at concentrations outside of this range. Acetaminophen concentrations >150 ug/mL at 4 hours after ingestion  and >50 ug/mL at 12 hours after ingestion are often associated with  toxic reactions.  Performed at [facility], [place] [address], [place], [place] [address]   cbc     Status: None   Collection Time: 09/12/21 11:58 AM  Result Value Ref Range   WBC 8.4 4.0 - 10.5 K/uL   RBC 4.57 3.87 - 5.11 MIL/uL   Hemoglobin 12.0 12.0 - 15.0 g/dL   HCT 37.3 36.0 - 46.0 %   MCV 81.6 80.0 - 100.0 fL   MCH 26.3 26.0 - 34.0 pg   MCHC 32.2 30.0 - 36.0 g/dL  RDW 13.4 11.5 - 15.5 %   Platelets 356 150 - 400 K/uL   nRBC 0.0 0.0 - 0.2 %    Comment: Performed at [facility], [place] [address] [place], [place] [address]  I-Stat beta hCG blood, Contreras     Status: None   Collection Time: 09/12/21 12:14 PM  Result Value Ref Range   I-stat hCG, quantitative <5.0 <5 mIU/mL   Comment 3            Comment:   GEST. AGE      CONC.  (mIU/mL)   <=1 WEEK        5 - 50     2 WEEKS       50 - 500     3 WEEKS       100 - 10,000     4 WEEKS     1,000 - 30,000        FEMALE AND NON-PREGNANT FEMALE:     LESS THAN 5 mIU/mL     Current Facility-Administered Medications  Medication Dose Route Frequency Provider Last Rate Last Admin   acetaminophen (TYLENOL) tablet 650 mg  650 mg Oral Q4H PRN Pattricia Boss, MD       albuterol (VENTOLIN HFA) 108 (90 Base) MCG/ACT inhaler 2 puff  2 puff Inhalation Q4H PRN Pattricia Boss, MD        Derrill Memo ON 09/13/2021] levothyroxine (SYNTHROID) tablet 137 mcg  137 mcg Oral QAC breakfast Pattricia Boss, MD       prazosin (MINIPRESS) capsule 2 mg  2 mg Oral QHS Pattricia Boss, MD       QUEtiapine (SEROQUEL) tablet 100 mg  100 mg Oral q morning Pattricia Boss, MD   100 mg at 09/12/21 1522   zolpidem (AMBIEN) tablet 5 mg  5 mg Oral QHS PRN Pattricia Boss, MD       Current Outpatient Medications  Medication Sig Dispense Refill   albuterol (VENTOLIN HFA) 108 (90 Base) MCG/ACT inhaler 2 puffs every 6 hours as needed for shortness of breath 8 g 2   atorvastatin (LIPITOR) 40 MG tablet 1 tab by mouth daily with evening meal 30 tablet 11   buprenorphine-naloxone (SUBOXONE) 8-2 mg SUBL SL tablet Place 1 tablet under the tongue 2 (two) times daily.     Fluticasone-Umeclidin-Vilant (TRELEGY ELLIPTA) 100-62.5-25 MCG/ACT AEPB Inhale 1 puff into the lungs daily. 1 each 11   levothyroxine (SYNTHROID) 137 MCG tablet Take 1 tablet (137 mcg total) by mouth daily before breakfast. 30 tablet 11   prazosin (MINIPRESS) 2 MG capsule 2 tabs by mouth at bedtime nightly 60 capsule 11   QUEtiapine (SEROQUEL) 100 MG tablet TAKE 1 TABLET BY MOUTH EVERY MORNING 30 tablet 0   QUEtiapine (SEROQUEL) 300 MG tablet TAKE 1 TABLET BY MOUTH AT BEDTIME. 30 tablet 0     Psychiatric Specialty Exam: Presentation  General Appearance: Appropriate for Environment  Eye Contact:Good  Speech:Clear and Coherent  Speech Volume:Normal  Handedness:No data recorded  Mood and Affect  Mood:Anxious  Affect:Congruent   Thought Process  Thought Processes:Coherent  Descriptions of Associations:Intact  Orientation:Full (Time, Place and Person)  Thought Content:Logical  History of Schizophrenia/Schizoaffective disorder:No data recorded Duration of Psychotic Symptoms:No data recorded Hallucinations:Hallucinations: None  Ideas of Reference:None  Suicidal Thoughts:Suicidal Thoughts: No  Homicidal Thoughts:Homicidal  Thoughts: No   Sensorium  Memory:Immediate Good  Judgment:Fair  Insight:Fair   Executive Functions  Concentration:Good  Attention Span:Good  [place]  Psychomotor Activity  Psychomotor Activity:Psychomotor Activity: Normal   Assets  Assets:Housing; Social Support; Physical Health; Resilience    Sleep  Sleep:Sleep: Fair   Physical Exam: Physical Exam Neurological:     Mental Status: She is alert and oriented to person, place, and time.    Review of Systems  Psychiatric/Behavioral:  The patient is nervous/anxious.   All other systems reviewed and are negative.  Blood pressure (!) 127/96, pulse 94, temperature 98 F (36.7 C), resp. rate 17, SpO2 93 %. There is no height or weight on file to calculate BMI.  Medical Decision Making: At this time, patient does not meet criteria for inpatient psychiatric treatment. Pt medications were sent to requested pharmacy. EDP and RN notified of disposition.   - Pt has follow up OP appt. At [facility] on August 9th.   Disposition: Patient does not meet criteria for psychiatric inpatient admission. Supportive therapy provided about ongoing stressors. Discussed crisis plan, support from social network, calling 911, coming to the Emergency Department, and calling Suicide Hotline.  Vesta Mixer, NP 09/12/2021 5:06 PM

## 2021-09-12 NOTE — ED Notes (Signed)
IVC paperwork has been rescinded and belongings returned to Pt.

## 2021-09-12 NOTE — ED Notes (Signed)
IVC paperwork completed, original in red folder

## 2021-09-12 NOTE — ED Provider Notes (Addendum)
[facility] EMERGENCY DEPARTMENT Provider Note   CSN: [id number] Arrival date & time: 09/12/21  1127     History  No chief complaint on file.   Meghan Contreras is a 56 y.o. female.  HPI 56 year old female history of COPD, Polar affective disorder, opiate addiction, hyperlipidemia presents today complaining of depression and suicidal ideation.  She states that she has thought of harming herself but does not have a specific plan.  She denies any overdose or specific actions to harm herself.     Home Medications Prior to Admission medications   Medication Sig Start Date End Date Taking? Authorizing Provider  albuterol (VENTOLIN HFA) 108 (90 Base) MCG/ACT inhaler 2 puffs every 6 hours as needed for shortness of breath 09/02/21   Mack Hook, MD  atorvastatin (LIPITOR) 40 MG tablet 1 tab by mouth daily with evening meal 01/09/21   Mack Hook, MD  buprenorphine-naloxone (SUBOXONE) 8-2 mg SUBL SL tablet Place 1 tablet under the tongue 2 (two) times daily.    [provider]  ferrous sulfate 325 (65 FE) MG tablet Take 325 mg by mouth daily. Patient not taking: Reported on 08/15/2019    [provider]  Fluticasone-Umeclidin-Vilant (TRELEGY ELLIPTA) 100-62.5-25 MCG/ACT AEPB Inhale 1 puff into the lungs daily. 09/02/21   Mack Hook, MD  levothyroxine (SYNTHROID) 137 MCG tablet Take 1 tablet (137 mcg total) by mouth daily before breakfast. 05/19/21   Mack Hook, MD  prazosin (MINIPRESS) 2 MG capsule 2 tabs by mouth at bedtime nightly 09/02/21   Mack Hook, MD  QUEtiapine (SEROQUEL) 100 MG tablet TAKE 1 TABLET BY MOUTH EVERY MORNING 09/11/21   Mack Hook, MD  QUEtiapine (SEROQUEL) 300 MG tablet TAKE 1 TABLET BY MOUTH AT BEDTIME. 09/11/21   Mack Hook, MD      Allergies    Amoxicillin and Latex    Review of Systems   Review of Systems  Physical Exam Updated Vital Signs BP (!) 127/96 (BP Location: Left  Arm)   Pulse 94   Temp 98 F (36.7 C)   Resp 17   SpO2 93%  Physical Exam Vitals and nursing note reviewed.  Constitutional:      Appearance: Normal appearance.  HENT:     Head: Normocephalic.     Right Ear: External ear normal.     Left Ear: External ear normal.     Nose: Nose normal.     Mouth/Throat:     Pharynx: Oropharynx is clear.  Eyes:     Pupils: Pupils are equal, round, and reactive to light.  Cardiovascular:     Rate and Rhythm: Normal rate and regular rhythm.     Pulses: Normal pulses.  Pulmonary:     Effort: Pulmonary effort is normal.  Abdominal:     General: Abdomen is flat.  Musculoskeletal:        General: Normal range of motion.     Cervical back: Normal range of motion.  Skin:    General: Skin is warm.     Capillary Refill: Capillary refill takes less than 2 seconds.  Neurological:     General: No focal deficit present.     Mental Status: She is alert.     ED Results / Procedures / Treatments   Labs (all labs ordered are listed, but only abnormal results are displayed) Labs Reviewed  COMPREHENSIVE METABOLIC PANEL - Abnormal; Notable for the following components:      Result Value   Glucose, Bld 133 (*)  Creatinine, Ser 1.03 (*)    Total Protein 8.2 (*)    All other components within normal limits  SALICYLATE LEVEL - Abnormal; Notable for the following components:   Salicylate Lvl <0.8 (*)    All other components within normal limits  ACETAMINOPHEN LEVEL - Abnormal; Notable for the following components:   Acetaminophen (Tylenol), Serum <10 (*)    All other components within normal limits  ETHANOL  CBC  RAPID URINE DRUG SCREEN, HOSP PERFORMED  I-STAT BETA HCG BLOOD, ED ([facility], [facility], [facility] ONLY)    EKG None  Radiology No results found.  Procedures Procedures    Medications Ordered in ED Medications - No data to display  ED Course/ Medical Decision Making/ A&P Clinical Course as of 09/12/21 1438  Sat Sep 12, 2021  1436 CBC reviewed  interpreted with mild hyperglycemia otherwise within normal limit [DR]  1436 Alcohol is not detectable [DR]  1436 Acetaminophen and salicylates are not detectable [DR]  1437 Urine drug screen is negative [DR]  1437 CBC reviewed interpreted normal [DR]    Clinical Course User Index [DR] Pattricia Boss, MD                           Medical Decision Making 56 year old female history of bipolar disorder presents today complaining of depression and suicidal thoughts. She is evaluated here in the emergency department with labs and no evidence of acute metabolic abnormalities noted Evidence of intoxicants as noted Patient is here voluntarily and is not IVC Patient is medically cleared for behavioral health evaluation  Amount and/or Complexity of Data Reviewed Labs: ordered.  Risk OTC drugs. Prescription drug management.   Patient now states she wishes to leave. IVC initiated        Final Clinical Impression(s) / ED Diagnoses Final diagnoses:  Suicidal ideation    Rx / DC Orders ED Discharge Orders     None         Pattricia Boss, MD 09/12/21 1438    Pattricia Boss, MD 09/12/21 1519

## 2021-09-12 NOTE — ED Notes (Signed)
Per Psych NP, Pt has been psych cleared.  EDP aware that IVC needs to be rescinded.

## 2021-09-12 NOTE — ED Triage Notes (Signed)
Patient complains of SI thoughts and has not had her daily BH meds x 3 days, states they have been misplaced. Alert and oriented, NAD

## 2021-09-16 ENCOUNTER — Other Ambulatory Visit: Payer: Self-pay

## 2021-09-16 MED ORDER — PRAZOSIN HCL 2 MG PO CAPS
ORAL_CAPSULE | ORAL | 11 refills | Status: DC
Start: 2021-09-16 — End: 2021-10-07

## 2021-09-17 ENCOUNTER — Other Ambulatory Visit: Payer: Self-pay

## 2021-09-18 ENCOUNTER — Other Ambulatory Visit: Payer: Self-pay

## 2021-09-18 DIAGNOSIS — Z79899 Other long term (current) drug therapy: Secondary | ICD-10-CM

## 2021-09-18 MED ORDER — QUETIAPINE FUMARATE 100 MG PO TABS: 100.0000 mg | ORAL_TABLET | Freq: Every morning | ORAL | 0 refills | Status: DC

## 2021-09-18 MED ORDER — QUETIAPINE FUMARATE 300 MG PO TABS: 300.0000 mg | ORAL_TABLET | Freq: Every day | ORAL | 0 refills | Status: DC

## 2021-09-21 ENCOUNTER — Telehealth: Payer: Self-pay

## 2021-09-21 NOTE — Telephone Encounter (Signed)
Patient called to report that the Suboxone program she was in closed this past Thursday. Patient recalls a discussion about a different program that gives free suboxone, but requires daily attendance. Patient would like to start that program, but does not remember what it was called or where she could go. Wants to know if Dr Amil Amen could give her that information again

## 2021-09-22 NOTE — Telephone Encounter (Signed)
ADS program

## 2021-09-24 NOTE — Telephone Encounter (Signed)
Attempted to call to give patient information. Unable to reach her

## 2021-09-24 NOTE — Telephone Encounter (Signed)
Patient has been notified of ADS information

## 2021-09-29 LAB — LAB REPORT - SCANNED
A1c: 5.9
EGFR: 62
EGFR: 75
HM Hepatitis Screen: POSITIVE

## 2021-09-30 ENCOUNTER — Telehealth: Payer: Self-pay

## 2021-09-30 NOTE — Telephone Encounter (Signed)
Patient would like an alternative to get her albuterol inhaler. [facility] pharmacy no long offer rescue inhalers.

## 2021-10-02 ENCOUNTER — Other Ambulatory Visit: Payer: Self-pay

## 2021-10-07 ENCOUNTER — Ambulatory Visit (HOSPITAL_COMMUNITY): Payer: No Payment, Other | Admitting: Licensed Clinical Social Worker

## 2021-10-07 ENCOUNTER — Encounter (HOSPITAL_COMMUNITY): Payer: Self-pay | Admitting: Psychiatry

## 2021-10-07 ENCOUNTER — Ambulatory Visit (INDEPENDENT_AMBULATORY_CARE_PROVIDER_SITE_OTHER): Payer: No Payment, Other | Admitting: Psychiatry

## 2021-10-07 VITALS — BP 128/87 | HR 75 | Ht 63.0 in | Wt 184.0 lb

## 2021-10-07 DIAGNOSIS — F17210 Nicotine dependence, cigarettes, uncomplicated: Secondary | ICD-10-CM

## 2021-10-07 DIAGNOSIS — F4310 Post-traumatic stress disorder, unspecified: Secondary | ICD-10-CM | POA: Diagnosis not present

## 2021-10-07 DIAGNOSIS — F3170 Bipolar disorder, currently in remission, most recent episode unspecified: Secondary | ICD-10-CM | POA: Diagnosis not present

## 2021-10-07 DIAGNOSIS — F17200 Nicotine dependence, unspecified, uncomplicated: Secondary | ICD-10-CM

## 2021-10-07 MED ORDER — PRAZOSIN HCL 2 MG PO CAPS: ORAL_CAPSULE | ORAL | 3 refills | Status: DC

## 2021-10-07 MED ORDER — QUETIAPINE FUMARATE 100 MG PO TABS: 100.0000 mg | ORAL_TABLET | Freq: Every morning | ORAL | 3 refills | Status: DC

## 2021-10-07 MED ORDER — QUETIAPINE FUMARATE 300 MG PO TABS: 300.0000 mg | ORAL_TABLET | Freq: Every day | ORAL | 3 refills | Status: DC

## 2021-10-07 MED ORDER — NICOTINE 21 MG/24HR TD PT24: 21.0000 mg | MEDICATED_PATCH | Freq: Every day | TRANSDERMAL | 3 refills | Status: DC
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
## 2021-10-07 NOTE — Progress Notes (Signed)
Psychiatric Initial Adult Assessment   Patient Identification: Meghan Contreras MRN:  [id number] Date of Evaluation:  10/07/2021 Referral Source: Mack Hook, MD Chief Complaint: "My PCP will no longer fill my medication" Visit Diagnosis:    ICD-10-CM   1. PTSD (post-traumatic stress disorder)  F43.10 prazosin (MINIPRESS) 2 MG capsule    2. Tobacco dependence  F17.200 nicotine (NICODERM CQ) 21 mg/24hr patch    3. 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)  F31.70 QUEtiapine (SEROQUEL) 300 MG tablet    QUEtiapine (SEROQUEL) 100 MG tablet      History of Present Illness: 56 year old female seen today for initial psychiatric evaluation.  She has a psychiatric history of depression, PTSD, bipolar affective disorder, tobacco dependence, and opioid addiction (in remission four years).  Patient is currently prescribed Seroquel 300 mg nightly, Seroquel 100 mg daily, and prazosin 4 mg nightly by her PCP.  She informed Probation officer that her PCP will no longer fill medications and is in need of a psychiatric provider.  Today she is well-groomed, pleasant, cooperative, engaged in conversation, maintained eye contact.  She informed Probation officer that she has been doing well on her current medication regimen however notes that she is in need of a new psychiatric provider as her PCP will no longer fill her psychiatric medications.  She informed Probation officer that her mood is stable and denies symptoms of mania.  Patient also reports that her anxiety and depression are well-managed. A GAD-7 was conducted by patient's therapist today and patient scored a 7.  A PHQ-9 was also conducted and the patient scored a 1.  She endorses adequate sleep and appetite.  Patient reports that prazosin is effective in helping manage her nightmares from past traumas.  At this time patient did not want to discuss trauma.  Provider endorsed understanding.  Patient reports that she receives assistance from [facility] to help manage her  sobriety.  She also notes that she attends AA meetings weekly.  She has been sober for 4 years.  No medication changes made today.  Patient agreeable to continue medications as prescribed.  She will follow-up with outpatient counseling for therapy.  No other concerns at this time.  Associated Signs/Symptoms: Depression Symptoms:  depressed mood, (Hypo) Manic Symptoms:   Denies Anxiety Symptoms:   Mild anxiety Psychotic Symptoms:   Denies PTSD Symptoms: Had a traumatic exposure:     Past Psychiatric History: Depression, PTSD, bipolar affective disorder, tobacco dependence, opioid addiction (in remission four years)  Previous Psychotropic Medications:  Zoloft, Seroquel, Prozac, Wellbutrin, Nicorette gum, prazosin,  Substance Abuse History in the last 12 months:  No.  Consequences of Substance Abuse: NA  Past Medical History:  Past Medical History:  Diagnosis Date   Anxiety    Depression    HOH (hard of hearing)    Hyperlipidemia 2021   Hyperthyroidism    Opiate addiction ([place])    On chronic suboxone since 2017 this last go around.     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  Old records from [place] with CT of soft tissue of chest and neck 11/2017:  No findings to suggest cause of stridorous breathing and no obvious lung disease.      Past Surgical History:  Procedure Laterality Date   THYROIDECTOMY  2019    Family Psychiatric History: None  Family History:  Family History  Problem Relation Age of Onset   HIV/AIDS Mother  from drug abuse   Drug abuse Mother    Drug abuse Brother    Hepatitis C Brother        From IV drug abuse   Drug abuse Sister    Drug abuse Sister    Drug abuse Sister    Drug abuse Sister    Drug abuse Sister    Cancer Sister        lung   Drug abuse Sister     Social History:   Social History   Socioeconomic History   Marital status: Legally Separated    Spouse  name: Not on file   Number of children: 2   Years of education: Not on file   Highest education level: Some college, no degree  Occupational History   Occupation: unemployed    Comment: Supported by daughter, ex husband and boyfriend.  Tobacco Use   Smoking status: Every Day    Packs/day: 1.00    Years: 42.00    Total pack years: 42.00    Types: Cigarettes   Smokeless tobacco: Never  Substance and Sexual Activity   Alcohol use: No   Drug use: Yes    Comment: Benzodiazepines / Klonopin/opiate addiction.  Also history of crack cocaine abuse.  2021 clean for 2017 on Suboxone.    Sexual activity: Not Currently  Other Topics Concern   Not on file  Social History Narrative   Lives alone, though her boyfriend will visit for 2 months at a time.   Social Determinants of Health   Financial Resource Strain: Not on file  Food Insecurity: Not on file  Transportation Needs: Not on file  Physical Activity: Not on file  Stress: Not on file  Social Connections: Not on file    Additional Social History: Patient resides in [place]. She is single and two older children. She is unemployed and on disability.  Reports smoking a pack of cigarettes a day.  She denies alcohol or illegal drug use.  Patient reports that she has been sober for 4 years.  Allergies:   Allergies  Allergen Reactions   Amoxicillin Swelling   Latex Hives and Swelling    Metabolic Disorder Labs: Lab Results  Component Value Date   HGBA1C 5.6 01/08/2021   No results found for: "PROLACTIN" Lab Results  Component Value Date   CHOL 274 (H) 11/04/2020   TRIG 498 (H) 11/04/2020   HDL 29 (L) 11/04/2020   LDLCALC 150 (H) 11/04/2020   LDLCALC 210 (H) 08/15/2019   Lab Results  Component Value Date   TSH 8.640 (H) 05/14/2021    Therapeutic Level Labs: No results found for: "LITHIUM" No results found for: "CBMZ" No results found for: "VALPROATE"  Current Medications: Current Outpatient Medications  Medication  Sig Dispense Refill   nicotine (NICODERM CQ) 21 mg/24hr patch Place 1 patch (21 mg total) onto the skin daily. 30 patch 3   atorvastatin (LIPITOR) 40 MG tablet 1 tab by mouth daily with evening meal 30 tablet 11   buprenorphine-naloxone (SUBOXONE) 8-2 mg SUBL SL tablet Place 1 tablet under the tongue 2 (two) times daily.     Fluticasone-Umeclidin-Vilant (TRELEGY ELLIPTA) 100-62.5-25 MCG/ACT AEPB Inhale 1 puff into the lungs daily. 1 each 11   levothyroxine (SYNTHROID) 137 MCG tablet Take 1 tablet (137 mcg total) by mouth daily before breakfast. 30 tablet 11   prazosin (MINIPRESS) 2 MG capsule 2 tabs by mouth at bedtime nightly 60 capsule 3   QUEtiapine (SEROQUEL) 100 MG tablet Take 1  tablet (100 mg total) by mouth every morning. 30 tablet 3   QUEtiapine (SEROQUEL) 300 MG tablet Take 1 tablet (300 mg total) by mouth at bedtime. 30 tablet 3   No current facility-administered medications for this visit.    Musculoskeletal: Strength & Muscle Tone: within normal limits Gait & Station: normal Patient leans: N/A  Psychiatric Specialty Exam: Review of Systems  There were no vitals taken for this visit.There is no height or weight on file to calculate BMI.  General Appearance: Well Groomed  Eye Contact:  Good  Speech:  Clear and Coherent and Normal Rate  Volume:  Normal  Mood:  Euthymic  Affect:  Appropriate and Congruent  Thought Process:  Coherent, Goal Directed, and Linear  Orientation:  Full (Time, Place, and Person)  Thought Content:  WDL and Logical  Suicidal Thoughts:  No  Homicidal Thoughts:  No  Memory:  Immediate;   Good Recent;   Good Remote;   Good  Judgement:  Good  Insight:  Good  Psychomotor Activity:  Normal  Concentration:  Concentration: Good and Attention Span: Good  Recall:  Good  Fund of Knowledge:Good  Language: Good  Akathisia:  No  Handed:  Right  AIMS (if indicated):  not done  Assets:  Communication Skills Desire for Improvement Financial  Resources/Insurance Housing Physical Health Social Support  ADL's:  Intact  Cognition: WNL  Sleep:  Good   Screenings: GAD-7    Health and safety inspector from 10/07/2021 in [facility]  Total GAD-7 Score 7      PHQ2-9    Flowsheet Row Counselor from 10/07/2021 in [place]  PHQ-2 Total Score 1      Flowsheet Row Counselor from 10/07/2021 in [facility] from 09/12/2021 in [facility] Error: Q3, 4, or 5 should not be populated when Q2 is No No Risk       Assessment and Plan: Patient reports that she is doing well on her current medication regimen.  She does note that she would like assistance with discontinuing tobacco.  Today she is agreeable to starting NicoDerm CQ 21 mg patches.    1. PTSD (post-traumatic stress disorder)  Continue- prazosin (MINIPRESS) 2 MG capsule; 2 tabs by mouth at bedtime nightly  Dispense: 60 capsule; Refill: 3  2. Tobacco dependence  Start- nicotine (NICODERM CQ) 21 mg/24hr patch; Place 1 patch (21 mg total) onto the skin daily.  Dispense: 30 patch; Refill: 3  3. 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)  Continue- QUEtiapine (SEROQUEL) 300 MG tablet; Take 1 tablet (300 mg total) by mouth at bedtime.  Dispense: 30 tablet; Refill: 3 Continue- QUEtiapine (SEROQUEL) 100 MG tablet; Take 1 tablet (100 mg total) by mouth every morning.  Dispense: 30 tablet; Refill: 3  Collaboration of Care: Other provider involved in patient's care AEB counselor  Patient/Guardian was advised Release of Information must be obtained prior to any record release in order to collaborate their care with an outside provider. Patient/Guardian was advised if they have not already done so to contact the registration department to sign all necessary forms in order for [place] to release information regarding their care.   Consent:  Patient/Guardian gives verbal consent for treatment and assignment of benefits for services provided during this visit. Patient/Guardian expressed understanding and agreed to proceed.   Follow-up in 43-monthFollow-up with therapy BSalley Slaughter NP 8/9/20239:45 AM

## 2021-10-09 MED ORDER — ALBUTEROL SULFATE HFA 108 (90 BASE) MCG/ACT IN AERS
2.0000 | INHALATION_SPRAY | Freq: Four times a day (QID) | RESPIRATORY_TRACT | 0 refills | Status: DC | PRN
Start: 2021-10-09 — End: 2022-06-22

## 2021-10-26 ENCOUNTER — Other Ambulatory Visit: Payer: Self-pay

## 2021-10-26 MED ORDER — LEVOTHYROXINE SODIUM 137 MCG PO TABS: 137.0000 ug | ORAL_TABLET | Freq: Every day | ORAL | 10 refills | Status: DC

## 2021-12-03 ENCOUNTER — Encounter: Payer: Self-pay | Admitting: Internal Medicine

## 2021-12-08 ENCOUNTER — Emergency Department (HOSPITAL_COMMUNITY)
Admission: EM | Admit: 2021-12-08 | Discharge: 2021-12-08 | Discharge status: Against Medical Advice | Payer: Self-pay | Attending: Student | Admitting: Student

## 2021-12-08 ENCOUNTER — Emergency Department (HOSPITAL_COMMUNITY): Payer: Self-pay

## 2021-12-08 ENCOUNTER — Other Ambulatory Visit: Payer: Self-pay

## 2021-12-08 ENCOUNTER — Encounter (HOSPITAL_COMMUNITY): Payer: Self-pay

## 2021-12-08 DIAGNOSIS — F419 Anxiety disorder, unspecified: Secondary | ICD-10-CM | POA: Insufficient documentation

## 2021-12-08 DIAGNOSIS — Z5321 Procedure and treatment not carried out due to patient leaving prior to being seen by health care provider: Secondary | ICD-10-CM | POA: Insufficient documentation

## 2021-12-08 DIAGNOSIS — R0602 Shortness of breath: Secondary | ICD-10-CM | POA: Insufficient documentation

## 2021-12-08 DIAGNOSIS — R0789 Other chest pain: Secondary | ICD-10-CM | POA: Insufficient documentation

## 2021-12-08 DIAGNOSIS — J449 Chronic obstructive pulmonary disease, unspecified: Secondary | ICD-10-CM | POA: Insufficient documentation

## 2021-12-08 LAB — TROPONIN I (HIGH SENSITIVITY): Troponin I (High Sensitivity): 6 ng/L (ref <18)

## 2021-12-08 LAB — I-STAT BETA HCG BLOOD, ED (MC, WL, AP ONLY): I-stat hCG, quantitative: <5 m[IU]/mL (ref <5)

## 2021-12-08 LAB — BASIC METABOLIC PANEL
Anion gap: 7 (ref 5–15)
BUN: 13 mg/dL (ref 6–20)
CO2: 29 mmol/L (ref 22–32)
Calcium: 9.3 mg/dL (ref 8.9–10.3)
Chloride: 105 mmol/L (ref 98–111)
Creatinine, Ser: 1.08 mg/dL — ABNORMAL HIGH (ref 0.44–1.00)
GFR, Estimated: >60 mL/min (ref >60)
Glucose, Bld: 91 mg/dL (ref 70–99)
Potassium: 4.6 mmol/L (ref 3.5–5.1)
Sodium: 141 mmol/L (ref 135–145)

## 2021-12-08 LAB — CBC
HCT: 38.3 % (ref 36.0–46.0)
Hemoglobin: 12.1 g/dL (ref 12.0–15.0)
MCH: 26.7 pg (ref 26.0–34.0)
MCHC: 31.6 g/dL (ref 30.0–36.0)
MCV: 84.4 fL (ref 80.0–100.0)
Platelets: 361 10*3/uL (ref 150–400)
RBC: 4.54 MIL/uL (ref 3.87–5.11)
RDW: 13.8 % (ref 11.5–15.5)
WBC: 10.1 10*3/uL (ref 4.0–10.5)
nRBC: 0 % (ref 0.0–0.2)

## 2021-12-08 MED ORDER — ALBUTEROL SULFATE HFA 108 (90 BASE) MCG/ACT IN AERS: 2.0000 | INHALATION_SPRAY | RESPIRATORY_TRACT | Status: DC | PRN

## 2021-12-08 NOTE — ED Notes (Signed)
Pt no longer wanted to wait pt left hospital. 

## 2021-12-08 NOTE — ED Provider Triage Note (Signed)
Emergency Medicine Provider Triage Evaluation Note  Meghan Contreras , a 56 y.o. female  was evaluated in triage.  Pt complains of shortness of breath and chest tightness.  Patient feels like she is having anxiety attack.  History of COPD on chronic oxygen however, patient unsure how much.  Review of Systems  Positive: Cp, SOB Negative: fever  Physical Exam  BP (!) 134/95 (BP Location: Right Arm)   Pulse (!) 106   Temp 98.1 F (36.7 C) (Oral)   Resp 20   Ht '5\' 3"'$  (1.6 m)   Wt 83.5 kg   SpO2 94%   BMI 32.59 kg/m  Gen:   Awake, no distress   Resp:  Normal effort  MSK:   Moves extremities without difficulty  Other:    Medical Decision Making  Medically screening exam initiated at 1:17 PM.  Appropriate orders placed.  [name] was informed that the remainder of the evaluation will be completed by another provider, this initial triage assessment does not replace that evaluation, and the importance of remaining in the ED until their evaluation is complete.  Labs CXR EKG   [name], [place] 12/08/21 1317

## 2021-12-08 NOTE — ED Triage Notes (Addendum)
Patient has been under a lot of stress due to a loss in the family and experienced a panic attack with sob.  Hx COPD diminished bases .  92% on RA 98% on 2L.  Pattient complaining of anxiety.  Intermittent chest tightness but none currently. Numbness to extremities due to hyperventilating. Patient reports she is suppose to wear oxygen at home. Patient has hx of stridor none noted today but voice is very hoarse. Patient keeps stating she feels as though she is going to die.

## 2021-12-28 ENCOUNTER — Ambulatory Visit (HOSPITAL_COMMUNITY)
Admission: EM | Admit: 2021-12-28 | Discharge: 2021-12-28 | Disposition: A | Discharge status: Home / Self Care | Payer: No Payment, Other | Attending: Registered Nurse | Admitting: Registered Nurse

## 2021-12-28 ENCOUNTER — Encounter (HOSPITAL_COMMUNITY): Payer: Self-pay | Admitting: Registered Nurse

## 2021-12-28 DIAGNOSIS — F4312 Post-traumatic stress disorder, chronic: Secondary | ICD-10-CM | POA: Insufficient documentation

## 2021-12-28 DIAGNOSIS — F4310 Post-traumatic stress disorder, unspecified: Secondary | ICD-10-CM | POA: Diagnosis present

## 2021-12-28 DIAGNOSIS — F3170 Bipolar disorder, currently in remission, most recent episode unspecified: Secondary | ICD-10-CM | POA: Insufficient documentation

## 2021-12-28 DIAGNOSIS — F410 Panic disorder [episodic paroxysmal anxiety] without agoraphobia: Secondary | ICD-10-CM | POA: Insufficient documentation

## 2021-12-28 DIAGNOSIS — F515 Nightmare disorder: Secondary | ICD-10-CM | POA: Insufficient documentation

## 2021-12-28 MED ORDER — HYDROXYZINE PAMOATE 25 MG PO CAPS: 25.0000 mg | ORAL_CAPSULE | Freq: Three times a day (TID) | ORAL | 0 refills | Status: DC | PRN

## 2021-12-28 NOTE — Discharge Instructions (Addendum)
If you need to be seen prior to your next scheduled appointment on 12/30/2021 at 9:00 AM you can come during walk in hours.  See hours below:  [facility]: Outpatient psychiatric Services  New Patient Assessment and Therapy Walk-in Monday thru Thursday 8:00 am first come first serve until slots are full Every Friday from 1:00 pm to 4:00 pm first come first serve until slots are full  New Patient Psychiatric Medication Management Monday thru Friday from 8:00 am to 11:00 am first come first served until slots are full  For all walk-ins we ask that you arrive by 7:15 am because patients will be seen in there order of arrival.   Availability is limited, and therefore you may not be seen on the same day that you walk in.  Our goal is to serve and meet the needs of our community to the best of our ability.

## 2021-12-28 NOTE — ED Provider Notes (Signed)
Behavioral Health Urgent Care Medical Screening Exam  Patient Name: Meghan Contreras MRN: [id number] Date of Evaluation: 12/28/21 Chief Complaint:   Diagnosis:  Final diagnoses:  Panic attacks  PTSD (post-traumatic stress disorder)  Bipolar affective disorder in remission ([place])  Nightmares associated with chronic post-traumatic stress disorder    History of Present illness: Meghan Contreras is a 56 y.o. female patient presented to [facility] as a walk in with complaints of anxiety and panic attacks  Meghan Contreras, 56 y.o., female patient seen face to face by this provider, consulted with Dr. Hampton Abbot; and chart reviewed on 12/28/21.  On evaluation Meghan Contreras reports she came in because of worsening panic attacks.  Reports she has been having panic attacks on and off since December 08, 2021.  Patient states she has an appointment to see her doctor for medication management on 11 03/2021 "But I cannot wait until then.  I went to the ED and they told me to come here.  My panic attacks are getting worse.  I have been taking my medicines as prescribed and they are not getting better."  Patient reports she is unsure what the stressor may be.  States she had a nephew and her best friend who passed a week apart in September.  "That is the only thing that has changed or happen.  In the beginning I was stressed out about it.  It was all I thought about.  But I thought my mind and my heart was okay.  I cannot think of anything is that might bother me." Patient reports she has outpatient services with alcohol and drug services substance use program.  "I also go to in a, have a counselor and a sponsor.  I have been clean for 4 years and 6 months.  I have come along way."  Patient denies suicidal/self-harm/homicidal ideations, psychosis, and paranoia. Patient states that the panic attacks is come out of the blue.  "Today I went home after group, playing game online, and he just started out of the blue after going  into the bathroom and I felt closed in like there was not enough space and I just felt like I had no control and I felt uncomfortable.  I took the bus here and I was sent to myself all the way here on the bus I hope I can make it there."  Discussed starting Vistaril 3 times a day until her appointment with Burt Ek on 12/30/2021.  Patient in agreement with starting Vistaril.  Reports she has taken it once before and it worked for her. During evaluation Meghan Contreras is sitting upright in chair with no noted distress.  She is alert/oriented x 4; calm/cooperative; and mood congruent with affect.  She is speaking in a clear tone at moderate volume, and normal pace; with good eye contact.  Her thought process is coherent and relevant; There is no indication that she is currently responding to internal/external stimuli or experiencing delusional thought content; and she has denied suicidal/self-harm/homicidal ideation, psychosis, and paranoia.   Patient has remained calm throughout assessment and has answered questions appropriately.  Patient encouraged to come during walk in hours if needs to be seen sooner than her next appointment 12/30/21.  Understanding voiced and agreed   Psychiatric Specialty Exam  Presentation  General Appearance:Appropriate for Environment; Casual  Eye Contact:Good  Speech:Clear and Coherent; Normal Rate  Speech Volume:Normal  Handedness:Right   Mood and Affect  Mood: Anxious  Affect: Congruent   Thought Process  Thought Processes: Coherent; Goal Directed  Descriptions of Associations:Intact  Orientation:Full (Time, Place and Person)  Thought Content:Logical  Diagnosis of Schizophrenia or Schizoaffective disorder in past: No   Hallucinations:None  Ideas of Reference:None  Suicidal Thoughts:No  Homicidal Thoughts:No   Sensorium  Memory: Immediate Good; Recent Good; Remote Good  Judgment: Intact  Insight: Present   Executive Functions   Concentration: Good  Attention Span: Good  Recall: Good  Fund of Knowledge: Good  Language: Good   Psychomotor Activity  Psychomotor Activity: Normal   Assets  Assets: Communication Skills; Desire for Improvement; Housing; Resilience   Sleep  Sleep: Fair  Number of hours: No data recorded  Nutritional Assessment (For OBS and FBC admissions only) Has the patient had a weight loss or gain of 10 pounds or more in the last 3 months?: No Has the patient had a decrease in food intake/or appetite?: No Does the patient have dental problems?: No Does the patient have eating habits or behaviors that may be indicators of an eating disorder including binging or inducing vomiting?: No Has the patient recently lost weight without trying?: 0 Has the patient been eating poorly because of a decreased appetite?: 0 Malnutrition Screening Tool Score: 0    Physical Exam: Physical Exam Vitals and nursing note reviewed. Exam conducted with a chaperone present.  Constitutional:      General: She is not in acute distress.    Appearance: Normal appearance. She is not ill-appearing.  Cardiovascular:     Rate and Rhythm: Tachycardia present.  Pulmonary:     Effort: Pulmonary effort is normal.     Breath sounds: Wheezing and rhonchi (bilateral) present.     Comments: History of COPD Musculoskeletal:        General: Normal range of motion.     Cervical back: Normal range of motion.  Skin:    General: Skin is warm and dry.  Neurological:     Mental Status: She is alert and oriented to person, place, and time.  Psychiatric:        Attention and Perception: Attention and perception normal. She does not perceive auditory or visual hallucinations.        Mood and Affect: Mood is anxious.        Speech: Speech normal.        Behavior: Behavior normal. Behavior is cooperative.        Thought Content: Thought content normal. Thought content is not paranoid or delusional. Thought  content does not include homicidal or suicidal ideation.        Cognition and Memory: Cognition normal.        Judgment: Judgment normal.    Review of Systems  Respiratory:  Positive for shortness of breath and wheezing.        History of COPD  Cardiovascular:  Positive for palpitations (With panic attack).  Psychiatric/Behavioral:  Depression: Stable. Hallucinations: Worsening anxiety and panic attack. Substance abuse: History of substance abuse clean for 4 years and 6 months. Suicidal ideas: Denies. The patient is nervous/anxious and has insomnia (Sleep is fair).    Blood pressure (!) 134/93, pulse (!) 104, temperature 98.6 F (37 C), temperature source Oral, resp. rate 18, SpO2 97 %. There is no height or weight on file to calculate BMI.  Musculoskeletal: Strength & Muscle Tone: within normal limits Gait & Station: normal Patient leans: N/A   [place] [facility] Discharge Disposition for Follow up and Recommendations: Based on my evaluation the patient does not appear to have  an emergency medical condition and can be discharged with resources and follow up care in outpatient services for Medication Management and Individual Therapy  Prescription for Vistaril 25 mg Tid prn 30 tablets no refill   Ozan Maclay, NP 12/28/2021, 5:10 PM

## 2021-12-28 NOTE — Progress Notes (Signed)
   12/28/21 [place] (Walk-ins at [facility] only)  How Did You Hear About [place]? Self  What Is the Reason for Your Visit/Call Today? Pt is a 56 yo female who presented voluntarily and unaccompanied due to worsening depression, PTSD and anxiety. Pt went to her group session today and once home, felt out of control. Pt came in to see if her medications needed to be adjusted. Pt has just begun to see [place] at [place] at the [facility]. Pt denied SI, HI, NSSH, AVH, paranoia and any substance use. Pt stated she has been sober for 4 years and 6 months from polysubstance abuse. Pt stated she is complaint with her prescribed medications.  How Long Has This Been Causing You Problems? > than 6 months  Have You Recently Had Any Thoughts About Hurting Yourself? No  Are You Planning to Commit Suicide/Harm Yourself At This time? No  Have you Recently Had Thoughts About Protection? No  Are You Planning To Harm Someone At This Time? No  Are you currently experiencing any auditory, visual or other hallucinations? No  Have You Used Any Alcohol or Drugs in the Past 24 Hours? No  Do you have any current medical co-morbidities that require immediate attention? No  Clinician description of patient physical appearance/behavior: Pt was calm, cooperative, alert and appeared oriented. Pt did not appear to be responding to internal stimuli, experiencing delusional thinking or intoxicated. Pt's speech and movement appeared within normal limits and appearance was unremarkable. Pt's mood seemed anxious and depressed, and pt had a flat affect which was congruent. Pt's speech and movement seemed nervous and jittery. Pt's judgment and insight seemed fair.  What Do You Feel Would Help You the Most Today? Medication(s)  Determination of Need Routine (7 days) (Per Shuvon Rankin NP, pt is psychiatrically cleared.)   Stanton Kidney T. Mare Ferrari, [place], [facility], [facility] Triage Specialist [facility]

## 2021-12-30 ENCOUNTER — Ambulatory Visit (INDEPENDENT_AMBULATORY_CARE_PROVIDER_SITE_OTHER): Payer: No Payment, Other | Admitting: Psychiatry

## 2021-12-30 ENCOUNTER — Encounter (HOSPITAL_COMMUNITY): Payer: Self-pay | Admitting: Psychiatry

## 2021-12-30 VITALS — BP 122/87 | HR 77 | Temp 97.8°F | Resp 12 | Ht 63.0 in | Wt 188.0 lb

## 2021-12-30 DIAGNOSIS — F4310 Post-traumatic stress disorder, unspecified: Secondary | ICD-10-CM

## 2021-12-30 DIAGNOSIS — F411 Generalized anxiety disorder: Secondary | ICD-10-CM | POA: Diagnosis not present

## 2021-12-30 DIAGNOSIS — F3170 Bipolar disorder, currently in remission, most recent episode unspecified: Secondary | ICD-10-CM

## 2021-12-30 DIAGNOSIS — F17200 Nicotine dependence, unspecified, uncomplicated: Secondary | ICD-10-CM | POA: Diagnosis not present

## 2021-12-30 MED ORDER — QUETIAPINE FUMARATE 150 MG PO TABS: 150.0000 mg | ORAL_TABLET | Freq: Every morning | ORAL | 3 refills | Status: DC

## 2021-12-30 MED ORDER — QUETIAPINE FUMARATE 300 MG PO TABS: 300.0000 mg | ORAL_TABLET | Freq: Every day | ORAL | 3 refills | Status: DC

## 2021-12-30 MED ORDER — HYDROXYZINE PAMOATE 25 MG PO CAPS: 25.0000 mg | ORAL_CAPSULE | Freq: Three times a day (TID) | ORAL | 3 refills | Status: DC | PRN

## 2021-12-30 MED ORDER — PRAZOSIN HCL 2 MG PO CAPS: ORAL_CAPSULE | ORAL | 3 refills | Status: DC

## 2021-12-30 MED ORDER — NICOTINE 21 MG/24HR TD PT24: 21.0000 mg | MEDICATED_PATCH | Freq: Every day | TRANSDERMAL | 3 refills | Status: DC

## 2021-12-30 NOTE — Progress Notes (Signed)
BH MD/PA/NP OP Progress Note  12/30/2021 9:39 AM Meghan Contreras  MRN:  [id number]  Chief Complaint: "I have been having panic attacks"  HPI: 56 year old female seen today for follow up psychiatric evaluation.  She has a psychiatric history of depression, PTSD, bipolar affective disorder, tobacco dependence, and opioid addiction (in remission four years).  Patient is currently prescribed Seroquel 300 mg nightly, Seroquel 100 mg daily, and prazosin 4 mg nightly by her PCP.  She was recently seen at [facility] on 12/28/2021 for panic attacks and was prescribed hydroxyzine 25 mg 3 times daily.  Patient notes that her medications are somewhat effective in managing her psychiatric conditions.  Today she is well-groomed, pleasant, cooperative, engaged in conversation, and maintained eye contact.  She informed Probation officer that she has been having panic attacks.  She notes that the only life change was that her nephew passed away as well as her sponsor.  Patient also notes that she also received new medications.  Patient informed Probation officer that she takes Trelegy Ellipta for her COPD and Subutex.  Provider informed patient that the medications in conjunction can cause increased palpitations and shortness of breath.  She notes that she does experience the symptoms when she has a panic attack but notes that she takes these medications at least an hour apart.  Patient informed Probation officer that she discontinued her NicoDerm patch prescription as she felt that it was causing her panic attacks but notes that after stopping them she continued to have panic attacks.  Today provider conducted a GAD-7 and patient scored a 15, at her last visit she scored a 7.  Provider also conducted PHQ-9 patient scored a 4, at her last visit she scored a 1.  She endorses adequate sleep and appetite.  Today she denies SI/HI/VAH or paranoia.  She does note that at times he is irritable, distractible, and has racing thoughts specifically when anxious.  Patient  continues to receive assistance with sobriety through [facility].  She notes that she goes to meetings 3 times a week.  Patient notes that she missed her last therapy appointment and would like to reschedule to discuss recent life changes.  Patient is scheduled to receive therapy on 01/13/2022.  At this time Seroquel 100 mg increased to 150 mg daily to help manage anxiety.  Patient will restart NicoDerm CQ 21 mg patches to help manage tobacco dependence. She will continue hydroxyzine and other medications as prescribed.  Patient asked if hydroxyzine was ineffective gabapentin could be trialed.  Provider was agreeable.  No other concerns at this time.  Visit Diagnosis:    ICD-10-CM   1. Generalized anxiety disorder  F41.1 QUEtiapine 150 MG TABS    QUEtiapine (SEROQUEL) 300 MG tablet    hydrOXYzine (VISTARIL) 25 MG capsule    2. PTSD (post-traumatic stress disorder)  F43.10 prazosin (MINIPRESS) 2 MG capsule    3. 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)  F31.70 QUEtiapine 150 MG TABS    QUEtiapine (SEROQUEL) 300 MG tablet    4. Tobacco dependence  F17.200 nicotine (NICODERM CQ) 21 mg/24hr patch      Past Psychiatric History: depression, PTSD, bipolar affective disorder, tobacco dependence, and opioid addiction (in remission four years).  Past Medical History:  Past Medical History:  Diagnosis Date   Anxiety    Depression    HOH (hard of hearing)    Hyperlipidemia 2021   Hyperthyroidism    Opiate addiction ([place])    On chronic suboxone since 2017 this last go around.  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  Old records from [place] with CT of soft tissue of chest and neck 11/2017:  No findings to suggest cause of stridorous breathing and no obvious lung disease.      Past Surgical History:  Procedure Laterality Date   THYROIDECTOMY  2019    Family Psychiatric History: Substance use sister  Family History:   Family History  Problem Relation Age of Onset   HIV/AIDS Mother        from drug abuse   Drug abuse Mother    Drug abuse Brother    Hepatitis C Brother        From IV drug abuse   Drug abuse Sister    Drug abuse Sister    Drug abuse Sister    Drug abuse Sister    Drug abuse Sister    Cancer Sister        lung   Drug abuse Sister     Social History:  Social History   Socioeconomic History   Marital status: Legally Separated    Spouse name: Not on file   Number of children: 2   Years of education: Not on file   Highest education level: Some college, no degree  Occupational History   Occupation: unemployed    Comment: Supported by daughter, ex husband and boyfriend.  Tobacco Use   Smoking status: Every Day    Packs/day: 1.00    Years: 42.00    Total pack years: 42.00    Types: Cigarettes   Smokeless tobacco: Never  Substance and Sexual Activity   Alcohol use: No   Drug use: Yes    Comment: Benzodiazepines / Klonopin/opiate addiction.  Also history of crack cocaine abuse.  2021 clean for 2017 on Suboxone.    Sexual activity: Not Currently  Other Topics Concern   Not on file  Social History Narrative   Lives alone, though her boyfriend will visit for 2 months at a time.   Social Determinants of Health   Financial Resource Strain: Not on file  Food Insecurity: Not on file  Transportation Needs: Not on file  Physical Activity: Not on file  Stress: Not on file  Social Connections: Not on file    Allergies:  Allergies  Allergen Reactions   Amoxicillin Swelling   Latex Hives and Swelling    Metabolic Disorder Labs: Lab Results  Component Value Date   HGBA1C 5.6 01/08/2021   No results found for: "PROLACTIN" Lab Results  Component Value Date   CHOL 274 (H) 11/04/2020   TRIG 498 (H) 11/04/2020   HDL 29 (L) 11/04/2020   LDLCALC 150 (H) 11/04/2020   LDLCALC 210 (H) 08/15/2019   Lab Results  Component Value Date   TSH 8.640 (H) 05/14/2021   TSH  16.500 (H) 01/08/2021    Therapeutic Level Labs: No results found for: "LITHIUM" No results found for: "VALPROATE" No results found for: "CBMZ"  Current Medications: Current Outpatient Medications  Medication Sig Dispense Refill   albuterol (VENTOLIN HFA) 108 (90 Base) MCG/ACT inhaler Inhale 2 puffs into the lungs every 6 (six) hours as needed for wheezing or shortness of breath. 8 g 0   atorvastatin (LIPITOR) 40 MG tablet 1 tab by mouth daily with evening meal 30 tablet 11   buprenorphine-naloxone (SUBOXONE) 8-2 mg SUBL SL tablet Place 1 tablet under the tongue 2 (two) times daily.     Fluticasone-Umeclidin-Vilant (TRELEGY ELLIPTA)  100-62.5-25 MCG/ACT AEPB Inhale 1 puff into the lungs daily. 1 each 11   hydrOXYzine (VISTARIL) 25 MG capsule Take 1 capsule (25 mg total) by mouth 3 (three) times daily as needed. 90 capsule 3   levothyroxine (SYNTHROID) 137 MCG tablet Take 1 tablet (137 mcg total) by mouth daily before breakfast. 30 tablet 10   nicotine (NICODERM CQ) 21 mg/24hr patch Place 1 patch (21 mg total) onto the skin daily. 30 patch 3   prazosin (MINIPRESS) 2 MG capsule 2 tabs by mouth at bedtime nightly 60 capsule 3   QUEtiapine (SEROQUEL) 300 MG tablet Take 1 tablet (300 mg total) by mouth at bedtime. 30 tablet 3   QUEtiapine 150 MG TABS Take 150 mg by mouth every morning. 30 tablet 3   No current facility-administered medications for this visit.     Musculoskeletal: Strength & Muscle Tone: within normal limits Gait & Station: normal Patient leans: N/A  Psychiatric Specialty Exam: Review of Systems  Blood pressure 122/87, pulse 77, temperature 97.8 F (36.6 C), resp. rate 12, height '5\' 3"'$  (1.6 m), weight 188 lb (85.3 kg), SpO2 95 %.Body mass index is 33.3 kg/m.  General Appearance: Well Groomed  Eye Contact:  Good  Speech:  Clear and Coherent and Normal Rate  Volume:  Normal  Mood:  Anxious  Affect:  Appropriate and Congruent  Thought Process:  Coherent, Goal  Directed, and Linear  Orientation:  Full (Time, Place, and Person)  Thought Content: WDL and Logical   Suicidal Thoughts:  No  Homicidal Thoughts:  No  Memory:  Immediate;   Good Recent;   Good Remote;   Good  Judgement:  Good  Insight:  Good  Psychomotor Activity:  Normal  Concentration:  Concentration: Good and Attention Span: Good  Recall:  Good  Fund of Knowledge: Good  Language: Good  Akathisia:  No  Handed:  Right  AIMS (if indicated): not done  Assets:  Communication Skills Desire for Improvement Financial Resources/Insurance Housing Intimacy Leisure Time Social Support  ADL's:  Intact  Cognition: WNL  Sleep:  Good   Screenings: GAD-7    Flowsheet Row Clinical Support from 12/30/2021 in [facility] Counselor from 10/07/2021 in [facility]  Total GAD-7 Score 15 7      PHQ2-9    Flowsheet Row Clinical Support from 12/30/2021 in [facility] Counselor from 10/07/2021 in [facility]  PHQ-2 Total Score 0 1  PHQ-9 Total Score 4 --      Flowsheet Row ED from 12/08/2021 in [facility] Counselor from 10/07/2021 in [facility] ED from 09/12/2021 in [place] No Risk Error: Q3, 4, or 5 should not be populated when Q2 is No No Risk        Assessment and Plan: Patient endorses increased anxiety and frequent panic attacks.  At times she notes that she is irritable, distractible and having specifically when anxious.At this time Seroquel 100 mg increased to 150 mg daily to help manage anxiety.  Patient will restart NicoDerm CQ 21 mg patches to help manage tobacco dependence.  She will continue hydroxyzine and other medications as prescribed.  Patient asked if hydroxyzine was ineffective gabapentin could be trialed.  Provider was agreeable.     1. PTSD (post-traumatic stress disorder)  Continue- prazosin (MINIPRESS) 2 MG capsule; 2 tabs by mouth at bedtime nightly  Dispense: 60 capsule; Refill: 3  2. 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)  Increased- QUEtiapine 150 MG TABS; Take 150 mg by mouth every morning.  Dispense: 30 tablet; Refill: 3 Continue- QUEtiapine (SEROQUEL) 300 MG tablet; Take 1 tablet (300 mg total) by mouth at bedtime.  Dispense: 30 tablet; Refill: 3  3. Tobacco dependence  Restart- nicotine (NICODERM CQ) 21 mg/24hr patch; Place 1 patch (21 mg total) onto the skin daily.  Dispense: 30 patch; Refill: 3  4. Generalized anxiety disorder  Increased- QUEtiapine 150 MG TABS; Take 150 mg by mouth every morning.  Dispense: 30 tablet; Refill: 3 Continue- QUEtiapine (SEROQUEL) 300 MG tablet; Take 1 tablet (300 mg total) by mouth at bedtime.  Dispense: 30 tablet; Refill: 3 Continue- hydrOXYzine (VISTARIL) 25 MG capsule; Take 1 capsule (25 mg total) by mouth 3 (three) times daily as needed.  Dispense: 90 capsule; Refill: 3  Collaboration of Care: Collaboration of Care: Other provider involved in patient's care AEB counselor  Patient/Guardian was advised Release of Information must be obtained prior to any record release in order to collaborate their care with an outside provider. Patient/Guardian was advised if they have not already done so to contact the registration department to sign all necessary forms in order for [place] to release information regarding their care.   Consent: Patient/Guardian gives verbal consent for treatment and assignment of benefits for services provided during this visit. Patient/Guardian expressed understanding and agreed to proceed.   Follow-up in 3 months Follow-up with therapy  Salley Slaughter, NP 12/30/2021, 9:39 AM

## 2022-01-06 ENCOUNTER — Inpatient Hospital Stay (HOSPITAL_COMMUNITY)
Admission: EM | Admit: 2022-01-06 | Discharge: 2022-01-11 | DRG: 736 | Disposition: A | Discharge status: Home / Self Care | Payer: Self-pay | Attending: Gynecologic Oncology | Admitting: Gynecologic Oncology

## 2022-01-06 ENCOUNTER — Emergency Department (HOSPITAL_COMMUNITY): Payer: Self-pay

## 2022-01-06 DIAGNOSIS — N9489 Other specified conditions associated with female genital organs and menstrual cycle: Secondary | ICD-10-CM | POA: Diagnosis present

## 2022-01-06 DIAGNOSIS — N83511 Torsion of right ovary and ovarian pedicle: Secondary | ICD-10-CM | POA: Diagnosis present

## 2022-01-06 DIAGNOSIS — R1032 Left lower quadrant pain: Secondary | ICD-10-CM

## 2022-01-06 DIAGNOSIS — K429 Umbilical hernia without obstruction or gangrene: Secondary | ICD-10-CM

## 2022-01-06 DIAGNOSIS — J9611 Chronic respiratory failure with hypoxia: Secondary | ICD-10-CM | POA: Insufficient documentation

## 2022-01-06 DIAGNOSIS — R1900 Intra-abdominal and pelvic swelling, mass and lump, unspecified site: Secondary | ICD-10-CM | POA: Diagnosis present

## 2022-01-06 DIAGNOSIS — Z780 Asymptomatic menopausal state: Secondary | ICD-10-CM

## 2022-01-06 DIAGNOSIS — F1129 Opioid dependence with unspecified opioid-induced disorder: Secondary | ICD-10-CM

## 2022-01-06 DIAGNOSIS — N281 Cyst of kidney, acquired: Secondary | ICD-10-CM | POA: Diagnosis present

## 2022-01-06 DIAGNOSIS — F1120 Opioid dependence, uncomplicated: Secondary | ICD-10-CM | POA: Insufficient documentation

## 2022-01-06 DIAGNOSIS — F319 Bipolar disorder, unspecified: Secondary | ICD-10-CM | POA: Insufficient documentation

## 2022-01-06 DIAGNOSIS — K5900 Constipation, unspecified: Secondary | ICD-10-CM

## 2022-01-06 DIAGNOSIS — R739 Hyperglycemia, unspecified: Secondary | ICD-10-CM | POA: Insufficient documentation

## 2022-01-06 DIAGNOSIS — K660 Peritoneal adhesions (postprocedural) (postinfection): Secondary | ICD-10-CM | POA: Diagnosis present

## 2022-01-06 DIAGNOSIS — N838 Other noninflammatory disorders of ovary, fallopian tube and broad ligament: Principal | ICD-10-CM | POA: Insufficient documentation

## 2022-01-06 DIAGNOSIS — K5909 Other constipation: Secondary | ICD-10-CM | POA: Diagnosis present

## 2022-01-06 DIAGNOSIS — R000 Tachycardia, unspecified: Secondary | ICD-10-CM | POA: Diagnosis not present

## 2022-01-06 DIAGNOSIS — E119 Type 2 diabetes mellitus without complications: Secondary | ICD-10-CM | POA: Insufficient documentation

## 2022-01-06 DIAGNOSIS — F411 Generalized anxiety disorder: Secondary | ICD-10-CM

## 2022-01-06 DIAGNOSIS — N83519 Torsion of ovary and ovarian pedicle, unspecified side: Secondary | ICD-10-CM

## 2022-01-06 DIAGNOSIS — D3912 Neoplasm of uncertain behavior of left ovary: Principal | ICD-10-CM | POA: Diagnosis present

## 2022-01-06 DIAGNOSIS — F17200 Nicotine dependence, unspecified, uncomplicated: Secondary | ICD-10-CM

## 2022-01-06 DIAGNOSIS — N7092 Oophoritis, unspecified: Secondary | ICD-10-CM | POA: Diagnosis present

## 2022-01-06 DIAGNOSIS — Z79899 Other long term (current) drug therapy: Secondary | ICD-10-CM

## 2022-01-06 DIAGNOSIS — E669 Obesity, unspecified: Secondary | ICD-10-CM | POA: Diagnosis present

## 2022-01-06 DIAGNOSIS — I700 Atherosclerosis of aorta: Secondary | ICD-10-CM | POA: Diagnosis present

## 2022-01-06 DIAGNOSIS — Z6826 Body mass index (BMI) 26.0-26.9, adult: Secondary | ICD-10-CM

## 2022-01-06 DIAGNOSIS — K760 Fatty (change of) liver, not elsewhere classified: Secondary | ICD-10-CM | POA: Diagnosis present

## 2022-01-06 DIAGNOSIS — Z8041 Family history of malignant neoplasm of ovary: Secondary | ICD-10-CM

## 2022-01-06 DIAGNOSIS — J449 Chronic obstructive pulmonary disease, unspecified: Secondary | ICD-10-CM | POA: Insufficient documentation

## 2022-01-06 DIAGNOSIS — E039 Hypothyroidism, unspecified: Secondary | ICD-10-CM | POA: Insufficient documentation

## 2022-01-06 DIAGNOSIS — Z881 Allergy status to other antibiotic agents status: Secondary | ICD-10-CM

## 2022-01-06 DIAGNOSIS — F4310 Post-traumatic stress disorder, unspecified: Secondary | ICD-10-CM | POA: Insufficient documentation

## 2022-01-06 DIAGNOSIS — I959 Hypotension, unspecified: Secondary | ICD-10-CM | POA: Diagnosis present

## 2022-01-06 DIAGNOSIS — N179 Acute kidney failure, unspecified: Secondary | ICD-10-CM | POA: Insufficient documentation

## 2022-01-06 DIAGNOSIS — J189 Pneumonia, unspecified organism: Secondary | ICD-10-CM | POA: Diagnosis not present

## 2022-01-06 DIAGNOSIS — E890 Postprocedural hypothyroidism: Secondary | ICD-10-CM | POA: Diagnosis present

## 2022-01-06 DIAGNOSIS — N83512 Torsion of left ovary and ovarian pedicle: Secondary | ICD-10-CM | POA: Diagnosis present

## 2022-01-06 DIAGNOSIS — J440 Chronic obstructive pulmonary disease with acute lower respiratory infection: Secondary | ICD-10-CM | POA: Diagnosis not present

## 2022-01-06 DIAGNOSIS — Z9981 Dependence on supplemental oxygen: Secondary | ICD-10-CM

## 2022-01-06 DIAGNOSIS — D649 Anemia, unspecified: Secondary | ICD-10-CM | POA: Insufficient documentation

## 2022-01-06 DIAGNOSIS — F17210 Nicotine dependence, cigarettes, uncomplicated: Secondary | ICD-10-CM | POA: Diagnosis present

## 2022-01-06 HISTORY — DX: Torsion of ovary and ovarian pedicle, unspecified side: N83.519

## 2022-01-06 HISTORY — DX: Chronic obstructive pulmonary disease, unspecified: J44.9

## 2022-01-06 HISTORY — DX: Bipolar disorder, unspecified: F31.9

## 2022-01-06 HISTORY — DX: Post-traumatic stress disorder, unspecified: F43.10

## 2022-01-06 HISTORY — DX: Anxiety disorder, unspecified: F41.9

## 2022-01-06 LAB — CBC WITH DIFFERENTIAL/PLATELET
Abs Immature Granulocytes: 0.02 10*3/uL (ref 0.00–0.07)
Basophils Absolute: 0 10*3/uL (ref 0.0–0.1)
Basophils Relative: 0 %
Eosinophils Absolute: 0.1 10*3/uL (ref 0.0–0.5)
Eosinophils Relative: 1 %
HCT: 35.5 % — ABNORMAL LOW (ref 36.0–46.0)
Hemoglobin: 11.5 g/dL — ABNORMAL LOW (ref 12.0–15.0)
Immature Granulocytes: 0 %
Lymphocytes Relative: 35 %
Lymphs Abs: 3.4 10*3/uL (ref 0.7–4.0)
MCH: 26.9 pg (ref 26.0–34.0)
MCHC: 32.4 g/dL (ref 30.0–36.0)
MCV: 82.9 fL (ref 80.0–100.0)
Monocytes Absolute: 0.6 10*3/uL (ref 0.1–1.0)
Monocytes Relative: 6 %
Neutro Abs: 5.8 10*3/uL (ref 1.7–7.7)
Neutrophils Relative %: 58 %
Platelets: 300 10*3/uL (ref 150–400)
RBC: 4.28 MIL/uL (ref 3.87–5.11)
RDW: 14 % (ref 11.5–15.5)
WBC: 9.9 10*3/uL (ref 4.0–10.5)
nRBC: 0 % (ref 0.0–0.2)

## 2022-01-06 LAB — COMPREHENSIVE METABOLIC PANEL
ALT: 17 U/L (ref 0–44)
AST: 17 U/L (ref 15–41)
Albumin: 3.9 g/dL (ref 3.5–5.0)
Alkaline Phosphatase: 69 U/L (ref 38–126)
Anion gap: 9 (ref 5–15)
BUN: 18 mg/dL (ref 6–20)
CO2: 27 mmol/L (ref 22–32)
Calcium: 8.7 mg/dL — ABNORMAL LOW (ref 8.9–10.3)
Chloride: 105 mmol/L (ref 98–111)
Creatinine, Ser: 1.26 mg/dL — ABNORMAL HIGH (ref 0.44–1.00)
GFR, Estimated: 50 mL/min — ABNORMAL LOW (ref >60)
Glucose, Bld: 149 mg/dL — ABNORMAL HIGH (ref 70–99)
Potassium: 4.3 mmol/L (ref 3.5–5.1)
Sodium: 141 mmol/L (ref 135–145)
Total Bilirubin: 0.4 mg/dL (ref 0.3–1.2)
Total Protein: 7.9 g/dL (ref 6.5–8.1)

## 2022-01-06 MED ORDER — FENTANYL CITRATE PF 50 MCG/ML IJ SOSY
50.0000 ug | PREFILLED_SYRINGE | Freq: Once | INTRAMUSCULAR | Status: AC
  Administered 2022-01-06: 50 ug via INTRAVENOUS
  Filled 2022-01-06: qty 1

## 2022-01-06 MED ORDER — IOHEXOL 300 MG/ML  SOLN
80.0000 mL | Freq: Once | INTRAMUSCULAR | Status: AC | PRN
  Administered 2022-01-07: 80 mL via INTRAVENOUS

## 2022-01-06 NOTE — ED Provider Triage Note (Signed)
Emergency Medicine Provider Triage Evaluation Note  Meghan Contreras , a 56 y.o. female  was evaluated in triage.  Pt complains of LLQ pain for the past 3 hours. Denies any other symptoms. Reports she has COPD and is on an unknown amount of oxygen.  Review of Systems  Positive:  Negative:   Physical Exam  There were no vitals taken for this visit. Gen:   Awake, no distress   Resp:  Normal effort  MSK:   Moves extremities without difficulty  Other:   Visible ventral hernia. Abdominal exam limited as patient was grabbing and hitting me away from her stomach. Abdomen is firm, but pliable, although she would not relax.  Medical Decision Making  Medically screening exam initiated at 10:15 PM.  Appropriate orders placed.  Meghan Contreras was informed that the remainder of the evaluation will be completed by another provider, this initial triage assessment does not replace that evaluation, and the importance of remaining in the ED until their evaluation is complete.  CT abdomen and labs ordereed.    Achille Rich, PA-C 01/06/22 2216

## 2022-01-06 NOTE — ED Triage Notes (Signed)
Pt arrives from home via [facility] from home, c/o left lower abdominal 80% RA (pt is supposed to be on 4 liters o2), pt refusing to be placed on oxygen initally by EMS, agreeable to being put on o2 just prior to arrival, saturations up to 89%. 146/90, hr 110, refused cbg

## 2022-01-06 NOTE — ED Notes (Signed)
Pt repeatedly out of room to nurses station. Pt has been told several times by several staff members to stay in her room. Pt continues not to comply. Triage RN aware.

## 2022-01-06 NOTE — ED Notes (Signed)
Pt taking off off her oxygen and walking around. Requested pt return back to room at this time. She continues to walk around

## 2022-01-06 NOTE — ED Notes (Signed)
Pt continues to take her oxygen off and keeps walking around. Replaced nasal cannula again and encouraged pt to keep on.

## 2022-01-07 ENCOUNTER — Emergency Department (HOSPITAL_COMMUNITY): Payer: Self-pay

## 2022-01-07 DIAGNOSIS — N9489 Other specified conditions associated with female genital organs and menstrual cycle: Secondary | ICD-10-CM | POA: Diagnosis present

## 2022-01-07 DIAGNOSIS — D649 Anemia, unspecified: Secondary | ICD-10-CM | POA: Insufficient documentation

## 2022-01-07 DIAGNOSIS — J449 Chronic obstructive pulmonary disease, unspecified: Secondary | ICD-10-CM | POA: Insufficient documentation

## 2022-01-07 DIAGNOSIS — F17200 Nicotine dependence, unspecified, uncomplicated: Secondary | ICD-10-CM

## 2022-01-07 DIAGNOSIS — J9611 Chronic respiratory failure with hypoxia: Secondary | ICD-10-CM | POA: Insufficient documentation

## 2022-01-07 DIAGNOSIS — F1120 Opioid dependence, uncomplicated: Secondary | ICD-10-CM | POA: Insufficient documentation

## 2022-01-07 DIAGNOSIS — R1032 Left lower quadrant pain: Secondary | ICD-10-CM

## 2022-01-07 DIAGNOSIS — N838 Other noninflammatory disorders of ovary, fallopian tube and broad ligament: Principal | ICD-10-CM | POA: Insufficient documentation

## 2022-01-07 DIAGNOSIS — N179 Acute kidney failure, unspecified: Secondary | ICD-10-CM | POA: Insufficient documentation

## 2022-01-07 DIAGNOSIS — F1129 Opioid dependence with unspecified opioid-induced disorder: Secondary | ICD-10-CM

## 2022-01-07 DIAGNOSIS — F411 Generalized anxiety disorder: Secondary | ICD-10-CM

## 2022-01-07 DIAGNOSIS — K429 Umbilical hernia without obstruction or gangrene: Secondary | ICD-10-CM

## 2022-01-07 DIAGNOSIS — F4310 Post-traumatic stress disorder, unspecified: Secondary | ICD-10-CM | POA: Insufficient documentation

## 2022-01-07 DIAGNOSIS — R739 Hyperglycemia, unspecified: Secondary | ICD-10-CM | POA: Insufficient documentation

## 2022-01-07 DIAGNOSIS — F319 Bipolar disorder, unspecified: Secondary | ICD-10-CM

## 2022-01-07 DIAGNOSIS — E039 Hypothyroidism, unspecified: Secondary | ICD-10-CM | POA: Insufficient documentation

## 2022-01-07 DIAGNOSIS — E119 Type 2 diabetes mellitus without complications: Secondary | ICD-10-CM | POA: Insufficient documentation

## 2022-01-07 LAB — CBC WITH DIFFERENTIAL/PLATELET
Abs Immature Granulocytes: 0.03 10*3/uL (ref 0.00–0.07)
Basophils Absolute: 0 10*3/uL (ref 0.0–0.1)
Basophils Relative: 0 %
Eosinophils Absolute: 0.1 10*3/uL (ref 0.0–0.5)
Eosinophils Relative: 1 %
HCT: 37.2 % (ref 36.0–46.0)
Hemoglobin: 12.2 g/dL (ref 12.0–15.0)
Immature Granulocytes: 0 %
Lymphocytes Relative: 36 %
Lymphs Abs: 3.4 10*3/uL (ref 0.7–4.0)
MCH: 26.6 pg (ref 26.0–34.0)
MCHC: 32.8 g/dL (ref 30.0–36.0)
MCV: 81.2 fL (ref 80.0–100.0)
Monocytes Absolute: 0.6 10*3/uL (ref 0.1–1.0)
Monocytes Relative: 6 %
Neutro Abs: 5.3 10*3/uL (ref 1.7–7.7)
Neutrophils Relative %: 57 %
Platelets: 300 10*3/uL (ref 150–400)
RBC: 4.58 MIL/uL (ref 3.87–5.11)
RDW: 14 % (ref 11.5–15.5)
WBC: 9.4 10*3/uL (ref 4.0–10.5)
nRBC: 0 % (ref 0.0–0.2)

## 2022-01-07 LAB — COMPREHENSIVE METABOLIC PANEL
ALT: 15 U/L (ref 0–44)
AST: 17 U/L (ref 15–41)
Albumin: 4.1 g/dL (ref 3.5–5.0)
Alkaline Phosphatase: 70 U/L (ref 38–126)
Anion gap: 4 — ABNORMAL LOW (ref 5–15)
BUN: 16 mg/dL (ref 6–20)
CO2: 30 mmol/L (ref 22–32)
Calcium: 9.2 mg/dL (ref 8.9–10.3)
Chloride: 109 mmol/L (ref 98–111)
Creatinine, Ser: 0.99 mg/dL (ref 0.44–1.00)
GFR, Estimated: >60 mL/min (ref >60)
Glucose, Bld: 108 mg/dL — ABNORMAL HIGH (ref 70–99)
Potassium: 3.8 mmol/L (ref 3.5–5.1)
Sodium: 143 mmol/L (ref 135–145)
Total Bilirubin: 0.5 mg/dL (ref 0.3–1.2)
Total Protein: 8.8 g/dL — ABNORMAL HIGH (ref 6.5–8.1)

## 2022-01-07 LAB — IRON AND TIBC
Iron: 102 ug/dL (ref 28–170)
Saturation Ratios: 25 % (ref 10.4–31.8)
TIBC: 416 ug/dL (ref 250–450)
UIBC: 314 ug/dL

## 2022-01-07 LAB — URINALYSIS, ROUTINE W REFLEX MICROSCOPIC
Bacteria, UA: NONE SEEN
Bilirubin Urine: NEGATIVE
Glucose, UA: NEGATIVE mg/dL
Ketones, ur: NEGATIVE mg/dL
Leukocytes,Ua: NEGATIVE
Nitrite: NEGATIVE
Protein, ur: NEGATIVE mg/dL
Specific Gravity, Urine: 1.014 (ref 1.005–1.030)
pH: 7 (ref 5.0–8.0)

## 2022-01-07 LAB — HEMOGLOBIN A1C
Hgb A1c MFr Bld: 5.8 % — ABNORMAL HIGH (ref 4.8–5.6)
Mean Plasma Glucose: 119.76 mg/dL

## 2022-01-07 MED ORDER — LEVOTHYROXINE SODIUM 25 MCG PO TABS
137.0000 ug | ORAL_TABLET | Freq: Every day | ORAL | Status: DC
  Administered 2022-01-08 – 2022-01-11 (×4): 137 ug via ORAL
  Filled 2022-01-07 (×4): qty 1

## 2022-01-07 MED ORDER — OXYCODONE-ACETAMINOPHEN 5-325 MG PO TABS: 2.0000 | ORAL_TABLET | Freq: Once | ORAL | Status: DC

## 2022-01-07 MED ORDER — OXYCODONE-ACETAMINOPHEN 5-325 MG PO TABS
2.0000 | ORAL_TABLET | Freq: Once | ORAL | Status: AC
  Administered 2022-01-07: 2 via ORAL
  Filled 2022-01-07: qty 2

## 2022-01-07 MED ORDER — UMECLIDINIUM BROMIDE 62.5 MCG/ACT IN AEPB
1.0000 | INHALATION_SPRAY | Freq: Every day | RESPIRATORY_TRACT | Status: DC
  Administered 2022-01-08 – 2022-01-11 (×4): 1 via RESPIRATORY_TRACT

## 2022-01-07 MED ORDER — FLUTICASONE FUROATE-VILANTEROL 100-25 MCG/ACT IN AEPB
1.0000 | INHALATION_SPRAY | Freq: Every day | RESPIRATORY_TRACT | Status: DC
  Filled 2022-01-07: qty 28

## 2022-01-07 MED ORDER — FLUTICASONE FUROATE-VILANTEROL 100-25 MCG/ACT IN AEPB
1.0000 | INHALATION_SPRAY | Freq: Every day | RESPIRATORY_TRACT | Status: DC
  Administered 2022-01-08 – 2022-01-11 (×4): 1 via RESPIRATORY_TRACT

## 2022-01-07 MED ORDER — HYDROXYZINE HCL 25 MG PO TABS
25.0000 mg | ORAL_TABLET | Freq: Three times a day (TID) | ORAL | Status: DC | PRN
  Administered 2022-01-09: 25 mg via ORAL
  Filled 2022-01-07: qty 1

## 2022-01-07 MED ORDER — IPRATROPIUM-ALBUTEROL 0.5-2.5 (3) MG/3ML IN SOLN
3.0000 mL | RESPIRATORY_TRACT | Status: DC | PRN
  Administered 2022-01-08: 3 mL via RESPIRATORY_TRACT
  Filled 2022-01-07: qty 3

## 2022-01-07 MED ORDER — ALBUTEROL SULFATE (2.5 MG/3ML) 0.083% IN NEBU: 3.0000 mL | INHALATION_SOLUTION | RESPIRATORY_TRACT | Status: DC | PRN

## 2022-01-07 MED ORDER — CHLORHEXIDINE GLUCONATE CLOTH 2 % EX PADS: 6.0000 | MEDICATED_PAD | CUTANEOUS | Status: DC

## 2022-01-07 MED ORDER — HYDROMORPHONE HCL 1 MG/ML IJ SOLN
0.5000 mg | INTRAMUSCULAR | Status: DC | PRN
  Administered 2022-01-07 (×2): 0.5 mg via INTRAVENOUS
  Filled 2022-01-07 (×2): qty 0.5

## 2022-01-07 MED ORDER — QUETIAPINE FUMARATE 50 MG PO TABS
100.0000 mg | ORAL_TABLET | Freq: Every day | ORAL | Status: DC
  Administered 2022-01-08 – 2022-01-09 (×2): 100 mg via ORAL
  Filled 2022-01-07 (×2): qty 2

## 2022-01-07 MED ORDER — UMECLIDINIUM BROMIDE 62.5 MCG/ACT IN AEPB
1.0000 | INHALATION_SPRAY | Freq: Every day | RESPIRATORY_TRACT | Status: DC
  Filled 2022-01-07: qty 7

## 2022-01-07 MED ORDER — LACTATED RINGERS IV BOLUS
1000.0000 mL | Freq: Once | INTRAVENOUS | Status: AC
  Administered 2022-01-07: 1000 mL via INTRAVENOUS

## 2022-01-07 MED ORDER — FENTANYL CITRATE PF 50 MCG/ML IJ SOSY
25.0000 ug | PREFILLED_SYRINGE | Freq: Once | INTRAMUSCULAR | Status: AC
  Administered 2022-01-07: 25 ug via INTRAVENOUS
  Filled 2022-01-07: qty 1

## 2022-01-07 MED ORDER — HYDROXYZINE PAMOATE 25 MG PO CAPS: 25.0000 mg | ORAL_CAPSULE | Freq: Three times a day (TID) | ORAL | Status: DC | PRN

## 2022-01-07 MED ORDER — IPRATROPIUM-ALBUTEROL 0.5-2.5 (3) MG/3ML IN SOLN: 3.0000 mL | Freq: Four times a day (QID) | RESPIRATORY_TRACT | Status: DC

## 2022-01-07 MED ORDER — NICOTINE 21 MG/24HR TD PT24
21.0000 mg | MEDICATED_PATCH | Freq: Every day | TRANSDERMAL | Status: DC
  Administered 2022-01-07 – 2022-01-11 (×5): 21 mg via TRANSDERMAL
  Filled 2022-01-07 (×5): qty 1

## 2022-01-07 MED ORDER — KCL IN DEXTROSE-NACL 10-5-0.45 MEQ/L-%-% IV SOLN
INTRAVENOUS | Status: DC
  Filled 2022-01-07 (×3): qty 1000

## 2022-01-07 MED ORDER — QUETIAPINE FUMARATE 50 MG PO TABS
300.0000 mg | ORAL_TABLET | Freq: Every day | ORAL | Status: DC
  Administered 2022-01-07 – 2022-01-08 (×2): 300 mg via ORAL
  Filled 2022-01-07 (×3): qty 6

## 2022-01-07 MED ORDER — HYDROMORPHONE HCL 1 MG/ML IJ SOLN
0.5000 mg | Freq: Once | INTRAMUSCULAR | Status: AC
  Administered 2022-01-07: 0.5 mg via INTRAVENOUS
  Filled 2022-01-07: qty 1

## 2022-01-07 MED ORDER — LORAZEPAM 2 MG/ML IJ SOLN
1.0000 mg | Freq: Four times a day (QID) | INTRAMUSCULAR | Status: DC | PRN
  Administered 2022-01-07: 1 mg via INTRAVENOUS
  Filled 2022-01-07: qty 1

## 2022-01-07 MED ORDER — OXYCODONE HCL 5 MG PO TABS
5.0000 mg | ORAL_TABLET | ORAL | Status: DC | PRN
  Administered 2022-01-07 – 2022-01-09 (×6): 10 mg via ORAL
  Filled 2022-01-07 (×6): qty 2

## 2022-01-07 MED ORDER — LACTATED RINGERS IV SOLN: INTRAVENOUS | Status: DC

## 2022-01-07 MED ORDER — HYDROMORPHONE HCL 1 MG/ML IJ SOLN: 1.0000 mg | Freq: Once | INTRAMUSCULAR | Status: DC

## 2022-01-07 NOTE — Consult Note (Signed)
Initial Consultation Note   Patient: Meghan Contreras QIO:[id number] DOB: 17-Jun-1965 PCP: Julieanne Manson, MD DOA: 01/06/2022 DOS: the patient was seen and examined on 01/07/2022 Primary service: Gilda Crease, *  Referring physician: Dr. Pricilla Holm Reason for consult: COPD  Assessment and Plan: COPD Chronic hypoxic respiratory failure     - resume home regimen when confirmed     - will add duonebs for now     - O2 as needed     - she has a lot of upper airway transmission, but does not seem to be in exacerbation as she is able to have full throat conversations w/o discomfort; will hold on steroids  AKI     - we don't have a baseline for her     - she does endorse frequent NSAID use     - imaging w/o hydro but "Multiple bilateral renal cysts measure up to 3 cm in the interpolar left kidney. Several smaller cystic lesions are suboptimally characterized on this CT. Further evaluation with MRI without and with contrast on a nonemergent/outpatient basis recommended."     - right now, she needs her ovaries taken care of; MRI of kidneys can be done after her procedure     - fluids, watch nephrotoxins  Bipolar d/o PTSD     - continue home regimen when confirmed and off NPO status     - will have PRN ativan   Normocytic anemia     - no evidence of bleed     - check iron studies  Hyperglycemia     - no Hx of DM     - check A1c  Opioid dependence     - on methadone     - resume home regimen when confirmed and off NPO status  Hypothyroidism     - continue home regimen when confirmed and off NPO status  Ovarian Mass Incarcerated hernia     - per primary and surgery  Remainder per primary  [facility] will continue to follow the patient.  HPI: Meghan Contreras is a 56 y.o. female with past medical history of bipolar d/o, PTSD, hypothyroidism, tobacco abuse, opioid dependence, COPD. Presenting with abdominal pain. She reports that she's had LLQ abdominal pain for months. She has been  able to tolerate it because she is on daily methadone. However, over the past couple of days it has become much worse. She is unable to find comfortable positions. Her methadone is no longer helping. She hasn't had any N/V or fevers. She is chronically constipated. She has not had any vaginal or rectal discharge. She does reports discharge from an area just inferior and to the right of her umbilicus, but she has not had any associate erythema or induration from that area. Last night she had a severe episode of pain, so she decided to come to the ED for evaluation. She denies any other aggravating of alleviating factors.   The patient has a long history of COPD. She has supplemental O2 at her house that has not been official prescribed. She uses it intermittently at 1L [place].   Review of Systems: As mentioned in the history of present illness. All other systems reviewed and are negative.  PMHx Bipolar d/o PTSD Opioid dependence Tobacco abuse COPD Hypothyroidism  PSHx Thyroidectomy  Social History:  Denies EtOH/illicit Rx usage. Reports 1ppd tobacco use.  No Known Allergies  FamHx Mother: ovarian cancer  Prior to Admission medications   Medication Sig Start Date End Date Taking? Authorizing  Provider  albuterol (VENTOLIN HFA) 108 (90 Base) MCG/ACT inhaler SMARTSIG:2 Puff(s) By Mouth Every 6 Hours PRN 10/09/21  Yes [provider]  hydrOXYzine (VISTARIL) 25 MG capsule Take 25 mg by mouth 3 (three) times daily as needed. 12/29/21  Yes [provider]  prazosin (MINIPRESS) 2 MG capsule Take by mouth at bedtime. 12/24/21  Yes [provider]    Physical Exam: Vitals:   01/07/22 0605 01/07/22 0651 01/07/22 0839 01/07/22 1139  BP: (!) 144/104  (!) 137/108 96/77  Pulse: (!) 101  78 79  Resp: 20  20 18   Temp:  98.2 F (36.8 C)  98.2 F (36.8 C)  TempSrc:  Oral  Oral  SpO2: 97%  98% 95%   General: 56 y.o. female resting in bed in NAD Eyes: PERRL, normal  sclera ENMT: Nares patent w/o discharge, orophaynx clear, dentition normal, ears w/o discharge/lesions/ulcers Neck: Supple, trachea midline Cardiovascular: RRR, +S1, S2, no m/g/r, equal pulses throughout Respiratory: UAT, decreased movement at bases GI: BS+, ND, slight TTP LLQ, no organomegaly noted MSK: No e/c/c Neuro: A&O x 3, no focal deficits Psyc: Anxious but cooperative  Data Reviewed:   Results for orders placed or performed during the hospital encounter of 01/06/22 (from the past 24 hour(s))  CBC with Differential     Status: Abnormal   Collection Time: 01/06/22 10:35 PM  Result Value Ref Range   WBC 9.9 4.0 - 10.5 K/uL   RBC 4.28 3.87 - 5.11 MIL/uL   Hemoglobin 11.5 (L) 12.0 - 15.0 g/dL   HCT 13/08/23 (L) 22.9 - 79.8 %   MCV 82.9 80.0 - 100.0 fL   MCH 26.9 26.0 - 34.0 pg   MCHC 32.4 30.0 - 36.0 g/dL   RDW 92.1 19.4 - 17.4 %   Platelets 300 150 - 400 K/uL   nRBC 0.0 0.0 - 0.2 %   Neutrophils Relative % 58 %   Neutro Abs 5.8 1.7 - 7.7 K/uL   Lymphocytes Relative 35 %   Lymphs Abs 3.4 0.7 - 4.0 K/uL   Monocytes Relative 6 %   Monocytes Absolute 0.6 0.1 - 1.0 K/uL   Eosinophils Relative 1 %   Eosinophils Absolute 0.1 0.0 - 0.5 K/uL   Basophils Relative 0 %   Basophils Absolute 0.0 0.0 - 0.1 K/uL   Immature Granulocytes 0 %   Abs Immature Granulocytes 0.02 0.00 - 0.07 K/uL  Comprehensive metabolic panel     Status: Abnormal   Collection Time: 01/06/22 10:35 PM  Result Value Ref Range   Sodium 141 135 - 145 mmol/L   Potassium 4.3 3.5 - 5.1 mmol/L   Chloride 105 98 - 111 mmol/L   CO2 27 22 - 32 mmol/L   Glucose, Bld 149 (H) 70 - 99 mg/dL   BUN 18 6 - 20 mg/dL   Creatinine, Ser 13/08/23 (H) 0.44 - 1.00 mg/dL   Calcium 8.7 (L) 8.9 - 10.3 mg/dL   Total Protein 7.9 6.5 - 8.1 g/dL   Albumin 3.9 3.5 - 5.0 g/dL   AST 17 15 - 41 U/L   ALT 17 0 - 44 U/L   Alkaline Phosphatase 69 38 - 126 U/L   Total Bilirubin 0.4 0.3 - 1.2 mg/dL   GFR, Estimated 50 (L) >60 mL/min   Anion gap  9 5 - 15  Urinalysis, Routine w reflex microscopic Urine, Clean Catch     Status: Abnormal   Collection Time: 01/07/22  6:45 AM  Result Value Ref Range  Color, Urine STRAW (A) YELLOW   APPearance CLEAR CLEAR   Specific Gravity, Urine 1.014 1.005 - 1.030   pH 7.0 5.0 - 8.0   Glucose, UA NEGATIVE NEGATIVE mg/dL   Hgb urine dipstick SMALL (A) NEGATIVE   Bilirubin Urine NEGATIVE NEGATIVE   Ketones, ur NEGATIVE NEGATIVE mg/dL   Protein, ur NEGATIVE NEGATIVE mg/dL   Nitrite NEGATIVE NEGATIVE   Leukocytes,Ua NEGATIVE NEGATIVE   RBC / HPF 0-5 0 - 5 RBC/hpf   WBC, UA 0-5 0 - 5 WBC/hpf   Bacteria, UA NONE SEEN NONE SEEN   Squamous Epithelial / LPF 0-5 0 - 5   Mucus PRESENT    CT ab/pelvis w/ 1. 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 Gynecology consult is advised. MRI without and with contrast may provide better characterization. 2. Skin thickening and stranding of the paraumbilical subcutaneous fat. Findings may represent a strangulated or incarcerated small fat containing umbilical hernia or inflammatory changes of the skin/cellulitis. No drainable fluid collection/abscess. 3. No bowel obstruction. Normal appendix. 4. Fatty liver. 5.  Aortic Atherosclerosis (ICD10-I70.0).     Family Communication: w/ husband at bedside Primary team communication: w/ Dr. Pricilla Holm Thank you very much for involving [place] in the care of your patient.  Author: Teddy Spike, DO 01/07/2022 12:03 PM  For on call review [email or web address].

## 2022-01-07 NOTE — ED Notes (Signed)
Pt ambulatory without assistance.  

## 2022-01-07 NOTE — ED Provider Notes (Signed)
[facility] Provider Note   CSN: [id number] Arrival date & time: 01/06/22  2202     History  Chief Complaint  Patient presents with   Abdominal Pain    Meghan Contreras is a 56 y.o. female.  HPI 56 year old female with a self-reported history of COPD (no record in our chart and no care everywhere) presents to the ER with complaints of 6 months of left lower abdominal pain which has been progressively worsening as well as umbilical pain.  She states that over the last several days her pain has become so unbearable that it prompted her to come to the ER.  She denies any nausea or vomiting.  Story of COPD, was officially initially found to be hypoxic at 80% on room air (post to wear 4 L at baseline).  Reports a history of ovarian cancer in her family.  No fevers or chills.  Has had a longstanding history with constipation but last bowel movement was 2 days ago. Pt reports taking methadone.     Home Medications Prior to Admission medications   Not on File      Allergies    Patient has no known allergies.    Review of Systems   Review of Systems Ten systems reviewed and are negative for acute change, except as noted in the HPI.   Physical Exam Updated Vital Signs BP (!) 144/104 (BP Location: Left Arm)   Pulse (!) 101   Temp (!) 97.1 F (36.2 C) (Oral)   Resp 20   SpO2 97%  Physical Exam Vitals and nursing note reviewed.  Constitutional:      General: She is not in acute distress.    Appearance: She is well-developed.  HENT:     Head: Normocephalic and atraumatic.  Eyes:     Conjunctiva/sclera: Conjunctivae normal.  Cardiovascular:     Rate and Rhythm: Normal rate and regular rhythm.     Heart sounds: No murmur heard. Pulmonary:     Effort: Pulmonary effort is normal. No respiratory distress.     Breath sounds: Normal breath sounds.  Abdominal:     Palpations: Abdomen is soft.     Tenderness: There is abdominal tenderness.      Comments: Abdomen distended, with left lower quadrant tenderness, area of induration at the umbilicus with mild tenderness, no significant overlying erythema.  No peritoneal signs, no guarding  Musculoskeletal:        General: No swelling.     Cervical back: Neck supple.  Skin:    General: Skin is warm and dry.     Capillary Refill: Capillary refill takes less than 2 seconds.  Neurological:     Mental Status: She is alert.  Psychiatric:        Mood and Affect: Mood normal.     ED Results / Procedures / Treatments   Labs (all labs ordered are listed, but only abnormal results are displayed) Labs Reviewed  CBC WITH DIFFERENTIAL/PLATELET - Abnormal; Notable for the following components:      Result Value   Hemoglobin 11.5 (*)    HCT 35.5 (*)    All other components within normal limits  COMPREHENSIVE METABOLIC PANEL - Abnormal; Notable for the following components:   Glucose, Bld 149 (*)    Creatinine, Ser 1.26 (*)    Calcium 8.7 (*)    GFR, Estimated 50 (*)    All other components within normal limits  URINALYSIS, ROUTINE W REFLEX MICROSCOPIC  CA 125  CEA    EKG None  Radiology CT ABDOMEN PELVIS W CONTRAST  Result Date: 01/07/2022 CLINICAL DATA:  Left lower quadrant abdominal pain. EXAM: CT ABDOMEN AND PELVIS WITH CONTRAST TECHNIQUE: Multidetector CT imaging of the abdomen and pelvis was performed using the standard protocol following bolus administration of intravenous contrast. RADIATION DOSE REDUCTION: This exam was performed according to the departmental dose-optimization program which includes automated exposure control, adjustment of the mA and/or kV according to patient size and/or use of iterative reconstruction technique. CONTRAST:  82mL OMNIPAQUE IOHEXOL 300 MG/ML  SOLN COMPARISON:  None Available. FINDINGS: Lower chest: There is partial atelectasis of the right middle lobe. No intra-abdominal free air.  Small free fluid in the pelvis. Hepatobiliary: Fatty liver. No  biliary dilatation. The gallbladder is unremarkable Pancreas: Unremarkable. No pancreatic ductal dilatation or surrounding inflammatory changes. Spleen: Normal in size without focal abnormality. Adrenals/Urinary Tract: The adrenal glands are unremarkable. Multiple bilateral renal cysts measure up to 3 cm in the interpolar left kidney. Several smaller cystic lesions are suboptimally characterized on this CT. Further evaluation with MRI without and with contrast on a nonemergent/outpatient basis recommended. There is no hydronephrosis on either side. There is symmetric enhancement and excretion of contrast by both kidneys. The visualized ureters and urinary bladder appear unremarkable. Stomach/Bowel: There is moderate stool throughout the colon. There is no bowel obstruction or active inflammation. The appendix is normal. Vascular/Lymphatic: Mild aortoiliac atherosclerotic disease. The IVC is unremarkable. No portal venous gas. There is no adenopathy. Reproductive: The uterus is anteverted. There is a small anterior body fibroid. 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 Gynecology consult is advised. Other: There is skin thickening and stranding of the paraumbilical subcutaneous fat. There may be a small fat containing umbilical hernia. This is however poorly seen due to edema. In the absence of recent procedure, findings may represent a strangulated or incarcerated small fat containing umbilical hernia or represent inflammatory changes of the skin/cellulitis. Clinical correlation is recommended. No drainable fluid collection/abscess. Musculoskeletal: No acute or significant osseous findings. IMPRESSION: 1. 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 Gynecology consult is advised. MRI without and with contrast may provide better characterization. 2. Skin thickening and stranding of the  paraumbilical subcutaneous fat. Findings may represent a strangulated or incarcerated small fat containing umbilical hernia or inflammatory changes of the skin/cellulitis. No drainable fluid collection/abscess. 3. No bowel obstruction. Normal appendix. 4. Fatty liver. 5.  Aortic Atherosclerosis (ICD10-I70.0). Electronically Signed   By: Elgie Collard M.D.   On: 01/07/2022 00:37    Procedures Procedures    Medications Ordered in ED Medications  fentaNYL (SUBLIMAZE) injection 50 mcg (50 mcg Intravenous Given 01/06/22 2256)  iohexol (OMNIPAQUE) 300 MG/ML solution 80 mL (80 mLs Intravenous Contrast Given 01/07/22 0009)  oxyCODONE-acetaminophen (PERCOCET/ROXICET) 5-325 MG per tablet 2 tablet (2 tablets Oral Given 01/07/22 4332)    ED Course/ Medical Decision Making/ A&P                           Medical Decision Making  56 year old female presenting with 6 months of lower abdominal pain.  Initially noted to be hypoxic on room air, supposed to be on 4 L nasal cannula for her COPD.  Otherwise hemodynamically stable.  Physical exam with left lower quadrant tenderness, distended abdomen, area of induration and tenderness to  the umbilicus with evidence of nonreducible hernia.  Labs ordered, reviewed and interpreted by me, CBC without leukocytosis, hemoglobin 11.5, no prior values to compare this to.  CMP with a creatinine 1.26, unclear of patient's baseline, glucose of 149.  CT of the abdomen pelvis was ordered in triage, with radiology read, findings as below  MPRESSION:  1. 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 Gynecology consult is advised. MRI without  and with contrast may provide better characterization.  2. Skin thickening and stranding of the paraumbilical subcutaneous  fat. Findings may represent a strangulated or incarcerated small fat  containing umbilical hernia or inflammatory changes of the  skin/cellulitis. No drainable fluid  collection/abscess.  3. No bowel obstruction. Normal appendix.  4. Fatty liver.  5.  Aortic Atherosclerosis (ICD10-I70.0).   Discussed the case with Dr. Pricilla Holm with gynecology oncology.  Turn for possible to worsening of the ovary given its size.  Recommends better pain control, will attempt to control pain with oral medications.  If if unable to control this, she will need admission and can likely go to the OR may be later today.  We will keep n.p.o. until we can determine her pain control.  If her pain improves with oral pain medications, she can follow-up with Dr. Pricilla Holm in the office tomorrow and likely plan for the OR sometime next week. Will obtain CEA and CA125 per Dr. Winferd Humphrey request.   In regards to her umbilical hernia, patient states that this has been ongoing for the last 6 months.  History of constipation but is still moving her bowels.  No evidence of bowel involvement on CT scan.  There is no evidence of overlying cellulitis.  Given this is fat-containing, likely no indication for emergent surgery.  Plan for pain control, if successful, can discharge with PO pain meds and GYN ONC followup. Otherwise will need admission.  Keep NPO until able to determine pain control. Dr. Pricilla Holm requests follow-up on patient's disposition for planning.  Signed out to oncoming team who will determine appropriate disposition  This was a shared visit with my supervising physician Dr. Blinda Leatherwood who independently saw and evaluated the patient & provided guidance in evaluation/management/disposition ,in agreement with care  Final Clinical Impression(s) / ED Diagnoses Final diagnoses:  Ovarian mass  Umbilical hernia without obstruction or gangrene    Rx / DC Orders ED Discharge Orders     None         Mare Ferrari, PA-C 01/07/22 0947    Gilda Crease, MD 01/07/22 [id number]

## 2022-01-07 NOTE — Consult Note (Addendum)
Gynecologic Oncology Consultation  Meghan Contreras 56 y.o. female  CC:  Chief Complaint  Patient presents with   Abdominal Pain    HPI: Meghan Contreras is a 56 year old female who presented to the [facility] ER on 01/06/2022 with symptoms of abdominal pain, O2 saturations at 80 % on RA. She underwent a CT scan of the AP with contrast resulting: 1. 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 Gynecology consult is advised. MRI without and with contrast may provide better characterization. 2. Skin thickening and stranding of the paraumbilical subcutaneous fat. Findings may represent a strangulated or incarcerated small fat containing umbilical hernia or inflammatory changes of the skin/cellulitis. No drainable fluid collection/abscess. 3. No bowel obstruction. Normal appendix. 4. Fatty liver. 5.  Aortic Atherosclerosis (ICD10-I70.0). Labs included Hgb 11.5, Hct 35.5, WBC 9.9, glucose 149, creatinine 1.26, GFR 50.    Interval History: The patient reports having the pain in her abdomen for the past 9 months. At this time, she also had clear drainage from her umbilicus. The pain worsened around 1 day ago and is described as intermittent and on the left. She has been constipated but states this is normal for her due to chronic opoid use. No nausea or emesis with reports of eating normally with a good appetite. She feels taking methadone (prescribed by [facility] per pt) probably masked her abdominal symptoms.  Her husband purchased oxygen for her to wear at home and states no medical provider prescribed it but he felt she needed it. She is on 1 L at home and wears it when feeling nervous or scared. "She doesn't use oxygen like she is supposed to" per husband. She has been on methadone for the past 2 mths and was on suboxone for 10 years.   Past surgical history includes a thyroidectomy. She went through menopause 5 years ago. G10 P2. States she has had pap smears in the past that have  been abnormal with having yeast etc and no procedure was recommended. Family history includes her mother having ovarian cancer. She had a hysterectomy at 19 for this and died of AIDS at age 61. She currently smokes 1 pack of cigarettes a day. No alcohol or marijuana use. No substance use for past 54 months with past hx of using crack cocaine.  Reports her last mammogram being 2 years ago. Never has had a colonoscopy but states she did a stool card test years ago.Her PCP is Julieanne Manson with [facility]   Review of Systems See interval  Current Meds: Current medications reviewed  Allergy: No Known Allergies  Social Hx:   Social History   Socioeconomic History   Marital status: Legally Separated    Spouse name: Not on file   Number of children: Not on file   Years of education: Not on file   Highest education level: Not on file  Occupational History   Not on file  Tobacco Use   Smoking status: Not on file   Smokeless tobacco: Not on file  Substance and Sexual Activity   Alcohol use: Not on file   Drug use: Not on file   Sexual activity: Not on file  Other Topics Concern   Not on file  Social History Narrative   Not on file   Social Determinants of Health   Financial Resource Strain: Not on file  Food Insecurity: Not on file  Transportation Needs: Not on file  Physical Activity: Not on file  Stress: Not on file  Social Connections: Not on file  Intimate Partner Violence: Not on file    Past Surgical Hx: Thyroidectomy per pt  Past Medical Hx: Chronic opioid use, hx substance abuse  Family Hx: Mother with ovarian cancer who died of AIDS.  Vitals:  Blood pressure (!) 137/108, pulse 78, temperature 98.2 F (36.8 C), temperature source Oral, resp. rate 20, SpO2 98 %.  Physical Exam:  Alert, oriented, partner at the bedside. See Dr. Pricilla Holm assessment/plan for exam findings. Abdomen slightly tender with palpation  Assessment/Plan: 57 year old female  currently admitted with moderate abdominal pain with findings of a large pelvic mass on CT imaging as well as a hernia. There is a concern for ovarian torsion given her symptoms. Surgical intervention offered today but patient refusing due to "being scared." States she will be ready tomorrow for surgery. Surgical plans include robotic assisted laparoscopic bilateral salpingo-oophorectomy, possible staging. Plan for clear liquid diet after midnight up until 3 hours preop then strict NPO.    Doylene Bode, NP 01/07/2022, 10:49 AM

## 2022-01-07 NOTE — ED Notes (Signed)
Pt refusing to keep monitoring equipment on.  

## 2022-01-07 NOTE — ED Provider Notes (Addendum)
Patient is a 56 year old female, who was evaluated by previous provider Byrd Hesselbach PA, who transitioned care, pending improvement of pain, need for further discussion with OB/GYN, surgery.  Physical Exam  BP (!) 144/104 (BP Location: Left Arm)   Pulse (!) 101   Temp (!) 97.1 F (36.2 C) (Oral)   Resp 20   SpO2 97%   Physical Exam Vitals and nursing note reviewed.  Constitutional:      General: She is not in acute distress.    Appearance: She is well-developed.  HENT:     Head: Normocephalic and atraumatic.  Eyes:     Conjunctiva/sclera: Conjunctivae normal.  Cardiovascular:     Rate and Rhythm: Normal rate and regular rhythm.     Heart sounds: No murmur heard. Pulmonary:     Effort: Pulmonary effort is normal.     Breath sounds: Normal breath sounds.  Abdominal:     Palpations: Abdomen is soft.     Tenderness: There is abdominal tenderness in the periumbilical area and left lower quadrant.  Musculoskeletal:        General: No swelling.     Cervical back: Neck supple.  Skin:    General: Skin is warm and dry.     Capillary Refill: Capillary refill takes less than 2 seconds.  Neurological:     Mental Status: She is alert.  Psychiatric:        Mood and Affect: Mood normal.     Procedures  Procedures  ED Course / MDM    Medical Decision Making Patient seen by previous provider, found to have mass concerning for ovarian cancer, that is possibly torsed causing pain.  Also found to have umbilical hernia, that was concerned to be strangulated versus incarcerated.  Does not have any redness, swelling of the abdomen.  Discussed with surgery on-call, they will evaluate patient.  Was handed off patient pending improvement of pain, pain did not improve after 3 pain medications, discussed with patient, will admit to hospital secondary to need for pain control, likely surgery in a.m.  We will keep n.p.o., until indicated by OB/GYN.  Dr. Pricilla Holm informed patient's request.  Admitted to  hospital.  The previous provider inquired on need for pelvic ultrasound to evaluate for torsion, no need for previous provider after discussing with OB/GYN.  Amount and/or Complexity of Data Reviewed Labs: ordered.  Risk Prescription drug management. Decision regarding hospitalization.      Pete Pelt, [place] 01/07/22 4818   Verified with Dr. Pricilla Holm, she is admitting and hospitalist will be consulting.   Pete Pelt, [place] 01/07/22 1535    Gwyneth Sprout, MD 01/11/22 1018

## 2022-01-07 NOTE — Consult Note (Signed)
Surgical Evaluation Requesting provider: Dutch Quint MD  Chief Complaint: abdominal pain  HPI: 56 year old woman with history of severe COPD on 1 L of oxygen at home, history of polysubstance abuse, sober for 4.5 years currently on methadone, who presented to the emergency room with a history of several months of left lower quadrant abdominal pain which has been worsening, in addition to umbilical pain.  Denies associated nausea or vomiting.  Her appetite has been excellent.  She has been having bowel movements that are regular for her although she does struggle with constipation due to history of opioid use.  Imaging demonstrates a large complex cystic adnexal mass for which Dr. Pricilla Holm is planning to take the patient to the OR.  Additionally noted with skin thickening and stranding of the periumbilical subcutaneous fat.  Surgery consulted for concern of incarcerated umbilical hernia.  Apparently several months ago she had Fontaine he is drainage from an area at the inferior right aspect of the umbilicus, and now has a chronic skin tag in this area.  No Known Allergies  PMH ; as above  Family history notable for ovarian cancer in her mother   Social History   Socioeconomic History   Marital status: Legally Separated    Spouse name: Not on file   Number of children: Not on file   Years of education: Not on file   Highest education level: Not on file  Occupational History   Not on file  Tobacco Use   Smoking status: Not on file   Smokeless tobacco: Not on file  Substance and Sexual Activity   Alcohol use: Not on file   Drug use: Not on file   Sexual activity: Not on file  Other Topics Concern   Not on file  Social History Narrative   Not on file   Social Determinants of Health   Financial Resource Strain: Not on file  Food Insecurity: Not on file  Transportation Needs: Not on file  Physical Activity: Not on file  Stress: Not on file  Social Connections: Not on file    No  current facility-administered medications on file prior to encounter.   Current Outpatient Medications on File Prior to Encounter  Medication Sig Dispense Refill   albuterol (VENTOLIN HFA) 108 (90 Base) MCG/ACT inhaler SMARTSIG:2 Puff(s) By Mouth Every 6 Hours PRN     hydrOXYzine (VISTARIL) 25 MG capsule Take 25 mg by mouth 3 (three) times daily as needed.     prazosin (MINIPRESS) 2 MG capsule Take by mouth at bedtime.      Review of Systems: a complete, 10pt review of systems was completed with pertinent positives and negatives as documented in the HPI  Physical Exam: Vitals:   01/07/22 0839 01/07/22 1139  BP: (!) 137/108 96/77  Pulse: 78 79  Resp: 20 18  Temp:  98.2 F (36.8 C)  SpO2: 98% 95%   Gen: Alert, sitting upright on a stool, no acute distress Respirations are labored with audible stridor but no tachypnea Abdomen is obese, soft, focally tender in the left lower quadrant, essentially nontender surrounding umbilicus where there is some induration of the deep subcutaneous tissues at the superior aspect, a small skin tag as mentioned at the right inferior aspect of the umbilicus, but fascial defect is not palpable and no obvious hernia.       Latest Ref Rng & Units 01/06/2022   10:35 PM  CBC  WBC 4.0 - 10.5 K/uL 9.9   Hemoglobin 12.0 - 15.0  g/dL 56.3   Hematocrit 87.5 - 46.0 % 35.5   Platelets 150 - 400 K/uL 300        Latest Ref Rng & Units 01/06/2022   10:35 PM  CMP  Glucose 70 - 99 mg/dL 643   BUN 6 - 20 mg/dL 18   Creatinine 3.29 - 1.00 mg/dL 5.18   Sodium 841 - 660 mmol/L 141   Potassium 3.5 - 5.1 mmol/L 4.3   Chloride 98 - 111 mmol/L 105   CO2 22 - 32 mmol/L 27   Calcium 8.9 - 10.3 mg/dL 8.7   Total Protein 6.5 - 8.1 g/dL 7.9   Total Bilirubin 0.3 - 1.2 mg/dL 0.4   Alkaline Phos 38 - 126 U/L 69   AST 15 - 41 U/L 17   ALT 0 - 44 U/L 17     No results found for: "INR", "PROTIME"  Imaging: CT ABDOMEN PELVIS W CONTRAST  Result Date:  01/07/2022 CLINICAL DATA:  Left lower quadrant abdominal pain. EXAM: CT ABDOMEN AND PELVIS WITH CONTRAST TECHNIQUE: Multidetector CT imaging of the abdomen and pelvis was performed using the standard protocol following bolus administration of intravenous contrast. RADIATION DOSE REDUCTION: This exam was performed according to the departmental dose-optimization program which includes automated exposure control, adjustment of the mA and/or kV according to patient size and/or use of iterative reconstruction technique. CONTRAST:  81mL OMNIPAQUE IOHEXOL 300 MG/ML  SOLN COMPARISON:  None Available. FINDINGS: Lower chest: There is partial atelectasis of the right middle lobe. No intra-abdominal free air.  Small free fluid in the pelvis. Hepatobiliary: Fatty liver. No biliary dilatation. The gallbladder is unremarkable Pancreas: Unremarkable. No pancreatic ductal dilatation or surrounding inflammatory changes. Spleen: Normal in size without focal abnormality. Adrenals/Urinary Tract: The adrenal glands are unremarkable. Multiple bilateral renal cysts measure up to 3 cm in the interpolar left kidney. Several smaller cystic lesions are suboptimally characterized on this CT. Further evaluation with MRI without and with contrast on a nonemergent/outpatient basis recommended. There is no hydronephrosis on either side. There is symmetric enhancement and excretion of contrast by both kidneys. The visualized ureters and urinary bladder appear unremarkable. Stomach/Bowel: There is moderate stool throughout the colon. There is no bowel obstruction or active inflammation. The appendix is normal. Vascular/Lymphatic: Mild aortoiliac atherosclerotic disease. The IVC is unremarkable. No portal venous gas. There is no adenopathy. Reproductive: The uterus is anteverted. There is a small anterior body fibroid. 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  Gynecology consult is advised. Other: There is skin thickening and stranding of the paraumbilical subcutaneous fat. There may be a small fat containing umbilical hernia. This is however poorly seen due to edema. In the absence of recent procedure, findings may represent a strangulated or incarcerated small fat containing umbilical hernia or represent inflammatory changes of the skin/cellulitis. Clinical correlation is recommended. No drainable fluid collection/abscess. Musculoskeletal: No acute or significant osseous findings. IMPRESSION: 1. 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 Gynecology consult is advised. MRI without and with contrast may provide better characterization. 2. Skin thickening and stranding of the paraumbilical subcutaneous fat. Findings may represent a strangulated or incarcerated small fat containing umbilical hernia or inflammatory changes of the skin/cellulitis. No drainable fluid collection/abscess. 3. No bowel obstruction. Normal appendix. 4. Fatty liver. 5.  Aortic Atherosclerosis (ICD10-I70.0). Electronically Signed   By: Ceasar Mons.D.  On: 01/07/2022 00:37     A/P: 56 year old woman with adnexal mass slated for surgery.  I reviewed her imaging personally.  There is what looks to be about a 5 mm fascial defect, under which the peritoneum appears to be smooth and intact.  There is no herniation of any intra-abdominal contents or fat.  There may be some preperitoneal fat that had herniated at some point in infarcted, versus subcutaneous fat necrosis or other subcutaneous pathology such as a sebaceous cyst that may have been the etiology for the drainage that she noted.  In any case, I do not recommend any emergent intervention for this.  General surgery team will be available if needed.      Phylliss Blakes, MD [facility], [place]  See AMION to contact appropriate on-call provider

## 2022-01-08 ENCOUNTER — Encounter (HOSPITAL_COMMUNITY)
Admission: EM | Disposition: A | Discharge status: Home / Self Care | Payer: Self-pay | Source: Home / Self Care | Attending: Gynecologic Oncology

## 2022-01-08 ENCOUNTER — Other Ambulatory Visit: Payer: Self-pay

## 2022-01-08 ENCOUNTER — Inpatient Hospital Stay (HOSPITAL_COMMUNITY): Payer: Self-pay | Admitting: Anesthesiology

## 2022-01-08 ENCOUNTER — Encounter (HOSPITAL_COMMUNITY): Payer: Self-pay | Admitting: Gynecologic Oncology

## 2022-01-08 DIAGNOSIS — N9489 Other specified conditions associated with female genital organs and menstrual cycle: Secondary | ICD-10-CM

## 2022-01-08 DIAGNOSIS — K660 Peritoneal adhesions (postprocedural) (postinfection): Secondary | ICD-10-CM

## 2022-01-08 DIAGNOSIS — J449 Chronic obstructive pulmonary disease, unspecified: Secondary | ICD-10-CM

## 2022-01-08 DIAGNOSIS — N83512 Torsion of left ovary and ovarian pedicle: Secondary | ICD-10-CM

## 2022-01-08 DIAGNOSIS — N838 Other noninflammatory disorders of ovary, fallopian tube and broad ligament: Secondary | ICD-10-CM

## 2022-01-08 DIAGNOSIS — F17210 Nicotine dependence, cigarettes, uncomplicated: Secondary | ICD-10-CM

## 2022-01-08 HISTORY — PX: ROBOTIC ASSISTED BILATERAL SALPINGO OOPHERECTOMY: SHX6078

## 2022-01-08 LAB — CBC
HCT: 36 % (ref 36.0–46.0)
Hemoglobin: 11.6 g/dL — ABNORMAL LOW (ref 12.0–15.0)
MCH: 26.5 pg (ref 26.0–34.0)
MCHC: 32.2 g/dL (ref 30.0–36.0)
MCV: 82.2 fL (ref 80.0–100.0)
Platelets: 285 10*3/uL (ref 150–400)
RBC: 4.38 MIL/uL (ref 3.87–5.11)
RDW: 13.9 % (ref 11.5–15.5)
WBC: 9.3 10*3/uL (ref 4.0–10.5)
nRBC: 0 % (ref 0.0–0.2)

## 2022-01-08 LAB — BASIC METABOLIC PANEL
Anion gap: 3 — ABNORMAL LOW (ref 5–15)
BUN: 17 mg/dL (ref 6–20)
CO2: 30 mmol/L (ref 22–32)
Calcium: 9.3 mg/dL (ref 8.9–10.3)
Chloride: 108 mmol/L (ref 98–111)
Creatinine, Ser: 0.98 mg/dL (ref 0.44–1.00)
GFR, Estimated: >60 mL/min (ref >60)
Glucose, Bld: 106 mg/dL — ABNORMAL HIGH (ref 70–99)
Potassium: 4.1 mmol/L (ref 3.5–5.1)
Sodium: 141 mmol/L (ref 135–145)

## 2022-01-08 LAB — SURGICAL PCR SCREEN
MRSA, PCR: NEGATIVE
Staphylococcus aureus: NEGATIVE

## 2022-01-08 LAB — CEA: CEA: 8.7 ng/mL — ABNORMAL HIGH (ref 0.0–4.7)

## 2022-01-08 LAB — TYPE AND SCREEN
ABO/RH(D): O POS
Antibody Screen: NEGATIVE

## 2022-01-08 LAB — CA 125: Cancer Antigen (CA) 125: 12.8 U/mL (ref 0.0–38.1)

## 2022-01-08 LAB — PREGNANCY, URINE: Preg Test, Ur: NEGATIVE

## 2022-01-08 LAB — ABO/RH: ABO/RH(D): O POS

## 2022-01-08 SURGERY — SALPINGO-OOPHORECTOMY, BILATERAL, ROBOT-ASSISTED
Anesthesia: General | Site: Abdomen

## 2022-01-08 MED ORDER — ALUM & MAG HYDROXIDE-SIMETH 200-200-20 MG/5ML PO SUSP: 30.0000 mL | ORAL | Status: DC | PRN

## 2022-01-08 MED ORDER — DEXAMETHASONE SODIUM PHOSPHATE 10 MG/ML IJ SOLN
INTRAMUSCULAR | Status: DC | PRN
  Administered 2022-01-08: 5 mg via INTRAVENOUS

## 2022-01-08 MED ORDER — METHADONE HCL 5 MG PO TABS
55.0000 mg | ORAL_TABLET | Freq: Every day | ORAL | Status: DC
  Administered 2022-01-08 – 2022-01-11 (×4): 55 mg via ORAL
  Filled 2022-01-08 (×4): qty 1

## 2022-01-08 MED ORDER — LACTATED RINGERS IV SOLN: INTRAVENOUS | Status: DC

## 2022-01-08 MED ORDER — SUGAMMADEX SODIUM 200 MG/2ML IV SOLN
INTRAVENOUS | Status: DC | PRN
  Administered 2022-01-08: 200 mg via INTRAVENOUS

## 2022-01-08 MED ORDER — BUPIVACAINE HCL 0.25 % IJ SOLN
INTRAMUSCULAR | Status: AC
  Filled 2022-01-08: qty 1

## 2022-01-08 MED ORDER — MIDAZOLAM HCL 2 MG/2ML IJ SOLN
INTRAMUSCULAR | Status: AC
  Filled 2022-01-08: qty 2

## 2022-01-08 MED ORDER — MIDAZOLAM HCL 2 MG/2ML IJ SOLN
INTRAMUSCULAR | Status: DC | PRN
  Administered 2022-01-08: 2 mg via INTRAVENOUS

## 2022-01-08 MED ORDER — ONDANSETRON HCL 4 MG/2ML IJ SOLN
INTRAMUSCULAR | Status: DC | PRN
  Administered 2022-01-08: 4 mg via INTRAVENOUS

## 2022-01-08 MED ORDER — FENTANYL CITRATE (PF) 250 MCG/5ML IJ SOLN
INTRAMUSCULAR | Status: DC | PRN
  Administered 2022-01-08 (×2): 50 ug via INTRAVENOUS
  Administered 2022-01-08: 100 ug via INTRAVENOUS
  Administered 2022-01-08: 50 ug via INTRAVENOUS

## 2022-01-08 MED ORDER — BUPIVACAINE-EPINEPHRINE (PF) 0.5% -1:200000 IJ SOLN
INTRAMUSCULAR | Status: AC
  Filled 2022-01-08: qty 30

## 2022-01-08 MED ORDER — PROMETHAZINE HCL 25 MG/ML IJ SOLN: 6.2500 mg | INTRAMUSCULAR | Status: DC | PRN

## 2022-01-08 MED ORDER — POLYETHYLENE GLYCOL 3350 17 G PO PACK
17.0000 g | PACK | Freq: Every day | ORAL | Status: DC
  Administered 2022-01-08 – 2022-01-11 (×4): 17 g via ORAL
  Filled 2022-01-08 (×4): qty 1

## 2022-01-08 MED ORDER — FENTANYL CITRATE PF 50 MCG/ML IJ SOSY: 25.0000 ug | PREFILLED_SYRINGE | INTRAMUSCULAR | Status: DC | PRN

## 2022-01-08 MED ORDER — LIDOCAINE 20MG/ML (2%) 15 ML SYRINGE OPTIME
INTRAMUSCULAR | Status: DC | PRN
  Administered 2022-01-08: 1.5 mg/kg/h via INTRAVENOUS

## 2022-01-08 MED ORDER — PROPOFOL 10 MG/ML IV BOLUS
INTRAVENOUS | Status: DC | PRN
  Administered 2022-01-08: 150 mg via INTRAVENOUS

## 2022-01-08 MED ORDER — ACETAMINOPHEN 160 MG/5ML PO SOLN: 325.0000 mg | ORAL | Status: DC | PRN

## 2022-01-08 MED ORDER — KETAMINE HCL 10 MG/ML IJ SOLN
INTRAMUSCULAR | Status: DC | PRN
  Administered 2022-01-08: 35 mg via INTRAVENOUS

## 2022-01-08 MED ORDER — LIDOCAINE HCL 2 % IJ SOLN
INTRAMUSCULAR | Status: AC
  Filled 2022-01-08: qty 20

## 2022-01-08 MED ORDER — PROPOFOL 10 MG/ML IV BOLUS
INTRAVENOUS | Status: AC
  Filled 2022-01-08: qty 20

## 2022-01-08 MED ORDER — HEPARIN SODIUM (PORCINE) 5000 UNIT/ML IJ SOLN
5000.0000 [IU] | INTRAMUSCULAR | Status: AC
  Administered 2022-01-08: 5000 [IU] via SUBCUTANEOUS
  Filled 2022-01-08: qty 1

## 2022-01-08 MED ORDER — ROCURONIUM BROMIDE 10 MG/ML (PF) SYRINGE
PREFILLED_SYRINGE | INTRAVENOUS | Status: AC
  Filled 2022-01-08: qty 10

## 2022-01-08 MED ORDER — METHADONE HCL 5 MG/5ML PO SOLN: 4.0000 mg | Freq: Every day | ORAL | Status: DC

## 2022-01-08 MED ORDER — SIMETHICONE 80 MG PO CHEW: 80.0000 mg | CHEWABLE_TABLET | Freq: Four times a day (QID) | ORAL | Status: DC | PRN

## 2022-01-08 MED ORDER — ROCURONIUM BROMIDE 10 MG/ML (PF) SYRINGE
PREFILLED_SYRINGE | INTRAVENOUS | Status: DC | PRN
  Administered 2022-01-08: 10 mg via INTRAVENOUS
  Administered 2022-01-08: 20 mg via INTRAVENOUS
  Administered 2022-01-08: 50 mg via INTRAVENOUS

## 2022-01-08 MED ORDER — LIDOCAINE 2% (20 MG/ML) 5 ML SYRINGE
INTRAMUSCULAR | Status: DC | PRN
  Administered 2022-01-08: 40 mg via INTRAVENOUS

## 2022-01-08 MED ORDER — SCOPOLAMINE 1 MG/3DAYS TD PT72
MEDICATED_PATCH | TRANSDERMAL | Status: AC
  Filled 2022-01-08: qty 1

## 2022-01-08 MED ORDER — ACETAMINOPHEN 10 MG/ML IV SOLN
INTRAVENOUS | Status: AC
  Filled 2022-01-08: qty 100

## 2022-01-08 MED ORDER — ACETAMINOPHEN 10 MG/ML IV SOLN
INTRAVENOUS | Status: DC | PRN
  Administered 2022-01-08: 1000 mg via INTRAVENOUS

## 2022-01-08 MED ORDER — CEFAZOLIN SODIUM-DEXTROSE 2-4 GM/100ML-% IV SOLN
INTRAVENOUS | Status: AC
  Filled 2022-01-08: qty 100

## 2022-01-08 MED ORDER — METHADONE HCL 10 MG PO TABS: 50.0000 mg | ORAL_TABLET | Freq: Every day | ORAL | Status: DC

## 2022-01-08 MED ORDER — KETAMINE HCL 10 MG/ML IJ SOLN
INTRAMUSCULAR | Status: AC
  Filled 2022-01-08: qty 1

## 2022-01-08 MED ORDER — ACETAMINOPHEN 325 MG PO TABS: 325.0000 mg | ORAL_TABLET | ORAL | Status: DC | PRN

## 2022-01-08 MED ORDER — OXYCODONE HCL 5 MG/5ML PO SOLN: 5.0000 mg | Freq: Once | ORAL | Status: DC | PRN

## 2022-01-08 MED ORDER — FENTANYL CITRATE (PF) 250 MCG/5ML IJ SOLN
INTRAMUSCULAR | Status: AC
  Filled 2022-01-08: qty 5

## 2022-01-08 MED ORDER — LACTATED RINGERS IR SOLN
Status: DC | PRN
  Administered 2022-01-08: 1000 mL

## 2022-01-08 MED ORDER — OXYCODONE HCL 5 MG PO TABS: 5.0000 mg | ORAL_TABLET | Freq: Once | ORAL | Status: DC | PRN

## 2022-01-08 MED ORDER — LACTATED RINGERS IV SOLN: INTRAVENOUS | Status: DC | PRN

## 2022-01-08 MED ORDER — LIDOCAINE HCL (PF) 2 % IJ SOLN
INTRAMUSCULAR | Status: AC
  Filled 2022-01-08: qty 5

## 2022-01-08 MED ORDER — PRAZOSIN HCL 1 MG PO CAPS
4.0000 mg | ORAL_CAPSULE | Freq: Every day | ORAL | Status: DC
  Administered 2022-01-09: 4 mg via ORAL
  Filled 2022-01-08 (×3): qty 4

## 2022-01-08 MED ORDER — METHADONE HCL 5 MG/5ML PO SOLN: 54.0000 mg | Freq: Every day | ORAL | Status: DC

## 2022-01-08 MED ORDER — AMISULPRIDE (ANTIEMETIC) 5 MG/2ML IV SOLN: 10.0000 mg | Freq: Once | INTRAVENOUS | Status: DC | PRN

## 2022-01-08 MED ORDER — ENOXAPARIN SODIUM 40 MG/0.4ML IJ SOSY
40.0000 mg | PREFILLED_SYRINGE | INTRAMUSCULAR | Status: DC
  Administered 2022-01-09 – 2022-01-11 (×3): 40 mg via SUBCUTANEOUS
  Filled 2022-01-08 (×3): qty 0.4

## 2022-01-08 MED ORDER — ACETAMINOPHEN 10 MG/ML IV SOLN: 1000.0000 mg | Freq: Once | INTRAVENOUS | Status: DC | PRN

## 2022-01-08 MED ORDER — STERILE WATER FOR IRRIGATION IR SOLN
Status: DC | PRN
  Administered 2022-01-08: 1000 mL

## 2022-01-08 MED ORDER — SUCCINYLCHOLINE CHLORIDE 200 MG/10ML IV SOSY
PREFILLED_SYRINGE | INTRAVENOUS | Status: DC | PRN
  Administered 2022-01-08: 140 mg via INTRAVENOUS

## 2022-01-08 MED ORDER — SCOPOLAMINE 1 MG/3DAYS TD PT72
MEDICATED_PATCH | TRANSDERMAL | Status: DC | PRN
  Administered 2022-01-08: 1.5 mg via TRANSDERMAL

## 2022-01-08 MED ORDER — ONDANSETRON HCL 4 MG/2ML IJ SOLN
INTRAMUSCULAR | Status: AC
  Filled 2022-01-08: qty 2

## 2022-01-08 MED ORDER — DEXAMETHASONE SODIUM PHOSPHATE 10 MG/ML IJ SOLN
INTRAMUSCULAR | Status: AC
  Filled 2022-01-08: qty 1

## 2022-01-08 MED ORDER — BUPIVACAINE HCL 0.25 % IJ SOLN
INTRAMUSCULAR | Status: DC | PRN
  Administered 2022-01-08: 33 mL

## 2022-01-08 SURGICAL SUPPLY — 71 items
APPLICATOR SURGIFLO ENDO (HEMOSTASIS) IMPLANT
BAG LAPAROSCOPIC 12 15 PORT 16 (BASKET) IMPLANT
BAG RETRIEVAL 12/15 (BASKET) ×1
BLADE SURG SZ10 CARB STEEL (BLADE) IMPLANT
COVER BACK TABLE 60X90IN (DRAPES) ×1 IMPLANT
COVER TIP SHEARS 8 DVNC (MISCELLANEOUS) ×1 IMPLANT
COVER TIP SHEARS 8MM DA VINCI (MISCELLANEOUS) ×1
DERMABOND ADVANCED .7 DNX12 (GAUZE/BANDAGES/DRESSINGS) ×1 IMPLANT
DRAPE ARM DVNC X/XI (DISPOSABLE) ×4 IMPLANT
DRAPE COLUMN DVNC XI (DISPOSABLE) ×1 IMPLANT
DRAPE DA VINCI XI ARM (DISPOSABLE) ×4
DRAPE DA VINCI XI COLUMN (DISPOSABLE) ×1
DRAPE SHEET LG 3/4 BI-LAMINATE (DRAPES) ×1 IMPLANT
DRAPE SURG IRRIG POUCH 19X23 (DRAPES) ×1 IMPLANT
DRSG OPSITE POSTOP 4X6 (GAUZE/BANDAGES/DRESSINGS) IMPLANT
DRSG OPSITE POSTOP 4X8 (GAUZE/BANDAGES/DRESSINGS) IMPLANT
ELECT PENCIL ROCKER SW 15FT (MISCELLANEOUS) IMPLANT
ELECT REM PT RETURN 15FT ADLT (MISCELLANEOUS) ×1 IMPLANT
GAUZE 4X4 16PLY ~~LOC~~+RFID DBL (SPONGE) ×2 IMPLANT
GLOVE BIO SURGEON STRL SZ 6 (GLOVE) ×4 IMPLANT
GLOVE BIO SURGEON STRL SZ 6.5 (GLOVE) ×1 IMPLANT
GOWN STRL REUS W/ TWL LRG LVL3 (GOWN DISPOSABLE) ×4 IMPLANT
GOWN STRL REUS W/TWL LRG LVL3 (GOWN DISPOSABLE) ×4
GRASPER SUT TROCAR 14GX15 (MISCELLANEOUS) IMPLANT
HOLDER FOLEY CATH W/STRAP (MISCELLANEOUS) IMPLANT
IRRIG SUCT STRYKERFLOW 2 WTIP (MISCELLANEOUS) ×1
IRRIGATION SUCT STRKRFLW 2 WTP (MISCELLANEOUS) ×1 IMPLANT
KIT PROCEDURE DA VINCI SI (MISCELLANEOUS)
KIT PROCEDURE DVNC SI (MISCELLANEOUS) IMPLANT
KIT TURNOVER KIT A (KITS) IMPLANT
LIGASURE IMPACT 36 18CM CVD LR (INSTRUMENTS) IMPLANT
MANIPULATOR ADVINCU DEL 3.0 PL (MISCELLANEOUS) IMPLANT
MANIPULATOR ADVINCU DEL 3.5 PL (MISCELLANEOUS) IMPLANT
MANIPULATOR UTERINE 4.5 ZUMI (MISCELLANEOUS) IMPLANT
NDL HYPO 21X1.5 SAFETY (NEEDLE) ×1 IMPLANT
NDL SPNL 18GX3.5 QUINCKE PK (NEEDLE) IMPLANT
NEEDLE HYPO 21X1.5 SAFETY (NEEDLE) ×1 IMPLANT
NEEDLE SPNL 18GX3.5 QUINCKE PK (NEEDLE) IMPLANT
OBTURATOR OPTICAL STANDARD 8MM (TROCAR) ×1
OBTURATOR OPTICAL STND 8 DVNC (TROCAR) ×1
OBTURATOR OPTICALSTD 8 DVNC (TROCAR) ×1 IMPLANT
PACK ROBOT GYN CUSTOM WL (TRAY / TRAY PROCEDURE) ×1 IMPLANT
PAD POSITIONING PINK XL (MISCELLANEOUS) ×1 IMPLANT
PORT ACCESS TROCAR AIRSEAL 12 (TROCAR) ×1 IMPLANT
SEAL CANN UNIV 5-8 DVNC XI (MISCELLANEOUS) ×4 IMPLANT
SEAL XI 5MM-8MM UNIVERSAL (MISCELLANEOUS) ×4
SET TRI-LUMEN FLTR TB AIRSEAL (TUBING) ×1 IMPLANT
SOL PREP POV-IOD 4OZ 10% (MISCELLANEOUS) ×2 IMPLANT
SPIKE FLUID TRANSFER (MISCELLANEOUS) ×1 IMPLANT
SPONGE T-LAP 18X18 ~~LOC~~+RFID (SPONGE) IMPLANT
SURGIFLO W/THROMBIN 8M KIT (HEMOSTASIS) IMPLANT
SUT MNCRL AB 4-0 PS2 18 (SUTURE) IMPLANT
SUT PDS AB 1 TP1 96 (SUTURE) IMPLANT
SUT VIC AB 0 CT1 27 (SUTURE)
SUT VIC AB 0 CT1 27XBRD ANTBC (SUTURE) IMPLANT
SUT VIC AB 2-0 CT1 27 (SUTURE)
SUT VIC AB 2-0 CT1 TAPERPNT 27 (SUTURE) IMPLANT
SUT VIC AB 4-0 PS2 18 (SUTURE) ×2 IMPLANT
SUT VLOC 180 0 9IN  GS21 (SUTURE)
SUT VLOC 180 0 9IN GS21 (SUTURE) IMPLANT
SYR 10ML LL (SYRINGE) IMPLANT
SYS BAG RETRIEVAL 10MM (BASKET) ×1
SYS WOUND ALEXIS 18CM MED (MISCELLANEOUS)
SYSTEM BAG RETRIEVAL 10MM (BASKET) IMPLANT
SYSTEM WOUND ALEXIS 18CM MED (MISCELLANEOUS) IMPLANT
TOWEL OR NON WOVEN STRL DISP B (DISPOSABLE) IMPLANT
TRAP SPECIMEN MUCUS 40CC (MISCELLANEOUS) IMPLANT
TRAY FOLEY MTR SLVR 16FR STAT (SET/KITS/TRAYS/PACK) ×1 IMPLANT
UNDERPAD 30X36 HEAVY ABSORB (UNDERPADS AND DIAPERS) ×2 IMPLANT
WATER STERILE IRR 1000ML POUR (IV SOLUTION) ×1 IMPLANT
YANKAUER SUCT BULB TIP 10FT TU (MISCELLANEOUS) IMPLANT

## 2022-01-08 NOTE — Progress Notes (Signed)
Plan for Surgery today, 01/08/2022 Day of Surgery Procedure(s) (LRB): XI ROBOTIC ASSISTED BILATERAL SALPINGO OOPHORECTOMY; POSSIBLE STAGING (N/A)  Subjective: Patient reports feeling ready for surgery this am. Ate a sandwich yesterday with no nausea or emesis. Voiding without difficulty. Last BM on Tuesday. Feels prn pain medications are helping with pain. Asking for anti-anxiety med before surgery. No other concerns voiced.   Objective: Vital signs in last 24 hours: Temp:  [97.5 F (36.4 C)-99.3 F (37.4 C)] 99.3 F (37.4 C) (11/10 0628) Pulse Rate:  [60-88] 73 (11/10 0628) Resp:  [17-20] 18 (11/10 0628) BP: (96-162)/(77-113) 148/101 (11/10 0628) SpO2:  [94 %-98 %] 94 % (11/10 0628) Weight:  [185 lb 6.5 oz (84.1 kg)] 185 lb 6.5 oz (84.1 kg) (11/09 2305) Last BM Date : 01/07/22  Intake/Output from previous day: 11/09 0701 - 11/10 0700 In: 2070 [P.O.:720; I.V.:350; IV Piggyback:1000] Out: 0   Physical Examination (performed by Dr. Pricilla Holm): General: alert, cooperative, and no distress Resp: lungs with inspiratory and expiratory wheezes Cardio: regular rate and rhythm, S1, S2 normal, no murmur, click, rub or gallop GI: abdomen distended, active bowel sounds Extremities: extremities normal, atraumatic, no cyanosis or edema  Labs: WBC/Hgb/Hct/Plts:  9.4/12.2/37.2/300 (11/09 1637) BUN/Cr/glu/ALT/AST/amyl/lip:  16/0.99/--/15/17/--/-- (11/09 1637)  Assessment: 56 y.o. who presented to the ER due to severe abdominal pain with CT imaging revealing a large adnexal mass and hernia. Concern for possible ovarian torsion. She is scheduled later today for: XI ROBOTIC ASSISTED BILATERAL SALPINGO OOPHORECTOMY; POSSIBLE STAGING  Pain:  Pain is well-controlled on PRN medications.  Heme: Labs ordered for this am. Last labs afternoon of 01/07/2022 with no critical values.  ID: No evidence of infection: WBC 9.4 last pm.  CV: BP elevated at times.   GI:  Tolerating po: on clears prior to surgery  later today.  GU: Last creatinine 0.99 from 1.26 at time of admission in the ER.    FEN: No critical values on last Cmet from 01/07/2022.  Prophylaxis: SCDs ordered for surgery along with heparin dose to be given 120 minutes pre-op  Plan: Plan for surgery later today including robotic assisted BSO, possible staging Pharmacy to check with clinic for methadone dosing Surgical plan including procedure and risks discussed with patient and family by Dr. Pricilla Holm   LOS: 1 day    Arena Lindahl D Kynesha Guerin 01/08/2022, 7:19 AM

## 2022-01-08 NOTE — Op Note (Signed)
OPERATIVE NOTE  Pre-operative Diagnosis: Adnexal mass, uncontrolled pain, suspected ovarian torsion  Post-operative Diagnosis: same, necrotic appearing left adnexa  Operation: Robotic-assisted bilateral salpingo-oophorectomy  Surgeon: Eugene Garnet MD  Assistant Surgeon: Warner Mccreedy NP  Anesthesia: GET  Urine Output: 150 cc  Operative Findings: On EUA, enlarged mass appreciated in the cul-de-sac, mobile.  On intra-abdominal entry, normal upper abdominal survey.  Normal small bowel.  Adhesions of the sigmoid to the infundibulopelvic ligament.  Right tube and ovary normal in appearance.  Left ovary enlarged with a 10-12 cm smooth mass, necrotic in appearance.  Fallopian tubes significantly dilated, edematous, and necrotic appearing.  Ovary torsed on its blood supply x2.  Uterus approximately 8 cm, normal in appearance.  No ascites.  Frozen section consistent with necrotic ovary, stromal tumor, without definite evidence of malignancy. Small, periumbilical hernia noted although not completely circumferential.  Given its small size, shallowness, and that it was not circumferential, decision made not to repair this.  Estimated Blood Loss:  50 cc      Total IV Fluids: see I&O flowsheet         Specimens: bilateral tubes and ovaries, pelvic washings         Complications:  None apparent; patient tolerated the procedure well.         Disposition: PACU - hemodynamically stable.  Procedure Details  The patient was seen in the Holding Room. The risks, benefits, complications, treatment options, and expected outcomes were discussed with the patient.  The patient concurred with the proposed plan, giving informed consent.  The site of surgery properly noted/marked. The patient was identified as Meghan Contreras and the procedure verified as a Robotic-assisted hysterectomy with bilateral salpingo oophorectomy.   After induction of anesthesia, the patient was draped and prepped in the usual  sterile manner. Patient was placed in supine position after anesthesia and draped and prepped in the usual sterile manner as follows: Her arms were tucked to her side with all appropriate precautions.  The patient was secured to the bed using padding and tape across her chest.  The patient was placed in the semi-lithotomy position in [place] stirrups.  The perineum and vagina were prepped with Betadine. The patient's abdomen was prepped with ChloraPrep and then she was draped after the prep had been allowed to dry for 3 minutes.  A Time Out was held and the above information confirmed.  The urethra was prepped with Betadine. Foley catheter was placed. OG tube placement was confirmed and to suction.   Next, a 10 mm skin incision was made 1 cm below the subcostal margin in the midclavicular line.  The 5 mm Optiview port and scope was used for direct entry.  Opening pressure was under 10 mm CO2.  The abdomen was insufflated and the findings were noted as above.   At this point and all points during the procedure, the patient's intra-abdominal pressure did not exceed 15 mmHg. Next, an 8 mm skin incision was made superior to the umbilicus and a right and left port were placed about 8 cm lateral to the robot port on the right and left side.  A fourth arm was placed on the right.  The 5 mm assist trocar was exchanged for a 10-12 mm port. All ports were placed under direct visualization.  The patient was placed in steep Trendelenburg.  The robot was docked in the normal manner.  The right and left peritoneum were opened parallel to the IP ligament to open the retroperitoneal spaces  bilaterally. The round ligaments were transected. The ureter was noted to be on the medial leaf of the broad ligament.  The peritoneum above the ureter was incised and stretched and the infundibulopelvic ligament was skeletonized, cauterized and cut.  The utero-ovarian ligament and fallopian tube were skeletonized, cauterized and transected.  Both adnexa were placed in an Endocatch bag.   Irrigation was used and excellent hemostasis was achieved.    Robotic instruments were removed and the camera was moved to the right lateral trocar. The enlarged left adnexa was removed in piecemeal fashion in a contained manner inside the endocatch bag. Once a portion of the adnexa had been removed, it was sent for frozen section. The remainder of the adnexa was removed with some difficulty given necrotic tissue was fractured easily. Once delivered, the right adnexa was delivered in its endocatch bag. 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  At this point in the procedure was completed. The robot was undocked. The fascia at the 10-12 mm port was closed with 0 Vicryl using a PMI fascial closure device.  The subcuticular tissue was closed with 4-0 Vicryl and the skin was closed with 4-0 Monocryl in a subcuticular manner.  Dermabond was applied.    Foley catheter was removed.  All sponge, lap and needle counts were correct x  3.   The patient was transferred to the recovery room in stable condition.  Jeral Pinch, MD

## 2022-01-08 NOTE — Transfer of Care (Signed)
Immediate Anesthesia Transfer of Care Note  Patient: Meghan Contreras  Procedure(s) Performed: XI ROBOTIC ASSISTED BILATERAL SALPINGO OOPHORECTOMY (Abdomen)  Patient Location: PACU  Anesthesia Type:General  Level of Consciousness: awake, oriented, and patient cooperative  Airway & Oxygen Therapy: Patient Spontanous Breathing and Patient connected to face mask oxygen  Post-op Assessment: Report given to RN and Post -op Vital signs reviewed and stable  Post vital signs: Reviewed  Last Vitals:  Vitals Value Taken Time  BP 157/96 01/08/22 1815  Temp 37.3 C 01/08/22 1815  Pulse 108 01/08/22 1824  Resp 11 01/08/22 1824  SpO2 92 % 01/08/22 1824  Vitals shown include unvalidated device data.  Last Pain:  Vitals:   01/08/22 1815  TempSrc:   PainSc: 6       Patients Stated Pain Goal: 3 (01/08/22 0900)  Complications: No notable events documented.

## 2022-01-08 NOTE — H&P (Signed)
GYNECOLOGIC ONCOLOGY H&P  HISTORY OF PRESENT ILLNESS: Meghan Contreras is a 56 year old female who presented to the [facility] ER on 01/06/2022 with symptoms of abdominal pain, O2 saturations at 80 % on RA. She underwent a CT scan of the AP with contrast resulting: 1. 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 Gynecology consult is advised. MRI without and with contrast may provide better characterization. 2. Skin thickening and stranding of the paraumbilical subcutaneous fat. Findings may represent a strangulated or incarcerated small fat containing umbilical hernia or inflammatory changes of the skin/cellulitis. No drainable fluid collection/abscess. 3. No bowel obstruction. Normal appendix. 4. Fatty liver. 5.  Aortic Atherosclerosis (ICD10-I70.0). Labs included Hgb 11.5, Hct 35.5, WBC 9.9, glucose 149, creatinine 1.26, GFR 50.     Interval History: The patient reports having the pain in her abdomen for the past 9 months. At this time, she also had clear drainage from her umbilicus. The pain worsened around 1 day ago and is described as intermittent and on the left. She has been constipated but states this is normal for her due to chronic opoid use. No nausea or emesis with reports of eating normally with a good appetite. She feels taking methadone (prescribed by [facility] per pt) probably masked her abdominal symptoms.   Her husband purchased oxygen for her to wear at home and states no medical provider prescribed it but he felt she needed it. She is on 1 L at home and wears it when feeling nervous or scared. "She doesn't use oxygen like she is supposed to" per husband. She has been on methadone for the past 2 mths and was on suboxone for 10 years.    Past surgical history includes a thyroidectomy. She went through menopause 5 years ago. G10 P2. States she has had pap smears in the past that have been abnormal with having yeast etc and no procedure was recommended. Family history  includes her mother having ovarian cancer. She had a hysterectomy at 31 for this and died of AIDS at age 89. She currently smokes 1 pack of cigarettes a day. No alcohol or marijuana use. No substance use for past 54 months with past hx of using crack cocaine.   Reports her last mammogram being 2 years ago. Never has had a colonoscopy but states she did a stool card test years ago.Her PCP is Julieanne Manson with [facility]   PAST MEDICAL HISTORY: History of substance abuse now on methadone COPD Tobacco abuse  PAST SURGICAL HISTORY: Thyroidectomy  OB/GYN HISTORY: G10P2  Age at menopause: Approximately 5 Hx of HRT: denies Last pap: Per chart review, 68; NIML History of abnormal pap smears: reports "mildly" abnormal paps, not needing additional follow-up or procedures  SCREENING STUDIES:  Last mammogram: 2 years ago Last colonoscopy: has never had, reports cologuard at some point  MEDICATIONS:  Current Facility-Administered Medications:    albuterol (PROVENTIL) (2.5 MG/3ML) 0.083% nebulizer solution 3 mL, 3 mL, Inhalation, PRN, Cross, Melissa D, NP   Chlorhexidine Gluconate Cloth 2 % PADS 6 each, 6 each, Topical, To OR, Cross, Melissa D, NP   dextrose 5 % and 0.45 % NaCl with KCl 10 mEq/L infusion, , Intravenous, Continuous, Cross, Melissa D, NP, Last Rate: 50 mL/hr at 01/08/22 0028, New Bag at 01/08/22 0028   fluticasone furoate-vilanterol (BREO ELLIPTA) 100-25 MCG/ACT 1 puff, 1 puff, Inhalation, Daily **AND** umeclidinium bromide (INCRUSE ELLIPTA) 62.5 MCG/ACT 1 puff, 1 puff, Inhalation, Daily, Carver Fila,  MD   HYDROmorphone (DILAUDID) injection 0.5 mg, 0.5 mg, Intravenous, Q4H PRN, Cross, Melissa D, NP, 0.5 mg at 01/07/22 2259   hydrOXYzine (ATARAX) tablet 25 mg, 25 mg, Oral, TID PRN, Carver Fila, MD   ipratropium-albuterol (DUONEB) 0.5-2.5 (3) MG/3ML nebulizer solution 3 mL, 3 mL, Nebulization, Q4H PRN, Lorre Nick, MD, 3 mL at 01/08/22 0537    lactated ringers infusion, , Intravenous, Continuous, Gwyneth Sprout, MD   levothyroxine (SYNTHROID) tablet 137 mcg, 137 mcg, Oral, QAC breakfast, Cross, Melissa D, NP, 137 mcg at 01/08/22 0542   LORazepam (ATIVAN) injection 1 mg, 1 mg, Intravenous, Q6H PRN, Ronaldo Miyamoto, Tyrone A, DO, 1 mg at 01/07/22 1335   nicotine (NICODERM CQ - dosed in mg/24 hours) patch 21 mg, 21 mg, Transdermal, Daily, Cross, Melissa D, NP, 21 mg at 01/07/22 1856   oxyCODONE (Oxy IR/ROXICODONE) immediate release tablet 5-10 mg, 5-10 mg, Oral, Q4H PRN, Cross, Melissa D, NP, 10 mg at 01/08/22 0500   QUEtiapine (SEROQUEL) tablet 100 mg, 100 mg, Oral, Daily, Cross, Melissa D, NP   QUEtiapine (SEROQUEL) tablet 300 mg, 300 mg, Oral, QHS, Cross, Melissa D, NP, 300 mg at 01/07/22 2200  ALLERGIES: Allergies  Allergen Reactions   Amoxicillin Swelling and Rash    FAMILY HISTORY: Mother - ovarian cancer in 30s although lived until mid-23s and died from other causes  SOCIAL HISTORY: Social History   Socioeconomic History   Marital status: Legally Separated    Spouse name: Not on file   Number of children: Not on file   Years of education: Not on file   Highest education level: Not on file  Occupational History   Not on file  Tobacco Use   Smoking status: Not on file   Smokeless tobacco: Not on file  Substance and Sexual Activity   Alcohol use: Not on file   Drug use: Not on file   Sexual activity: Not on file  Other Topics Concern   Not on file  Social History Narrative   Not on file   Social Determinants of Health   Financial Resource Strain: Not on file  Food Insecurity: Not on file  Transportation Needs: Not on file  Physical Activity: Not on file  Stress: Not on file  Social Connections: Not on file  Intimate Partner Violence: Not on file    REVIEW OF SYSTEMS: Complete 10-system review is negative except for the following: see HPI  PHYSICAL EXAM: BP 128/79 (BP Location: Left Arm)   Pulse 66   Temp  98.6 F (37 C) (Oral)   Resp 18   Ht 5\' 10"  (1.778 m)   Wt 185 lb 6.5 oz (84.1 kg)   SpO2 95%   BMI 26.60 kg/m  General: Alert and oriented, appears somewhat uncomfortable  HEENT: normocephalic, atraumatic  Chest: Significant inspiratory and expiratory wheezing appreciated  Cardiovascular: Regular rate and rhythm, no murmurs or rubs appreciated  Abdomen: Mildly distended, normal bowel sounds, mild tenderness to palpation diffusely, no fluid wave, no masses appreciated; small, 1 cm cystic lesion on the skin at the right aspect of the inferior portion of the umbilicus  Extremities: Grossly normal range of motion.  Warm, well perfused.  No edema bilaterally. Skin: No rashes or lesions. Lymphatics: No cervical, supraclavicular, or inguinal adenopathy. GU: Deferred  LABORATORY AND RADIOLOGIC DATA: CBC    Component Value Date/Time   WBC 9.4 01/07/2022 1637   RBC 4.58 01/07/2022 1637   HGB 12.2 01/07/2022 1637   HCT 37.2 01/07/2022 1637  PLT 300 01/07/2022 1637   MCV 81.2 01/07/2022 1637   MCH 26.6 01/07/2022 1637   MCHC 32.8 01/07/2022 1637   RDW 14.0 01/07/2022 1637   LYMPHSABS 3.4 01/07/2022 1637   MONOABS 0.6 01/07/2022 1637   EOSABS 0.1 01/07/2022 1637   BASOSABS 0.0 01/07/2022 1637      Latest Ref Rng & Units 01/07/2022    4:37 PM 01/06/2022   10:35 PM  BMP  Glucose 70 - 99 mg/dL 154  008   BUN 6 - 20 mg/dL 16  18   Creatinine 6.76 - 1.00 mg/dL 1.95  0.93   Sodium 267 - 145 mmol/L 143  141   Potassium 3.5 - 5.1 mmol/L 3.8  4.3   Chloride 98 - 111 mmol/L 109  105   CO2 22 - 32 mmol/L 30  27   Calcium 8.9 - 10.3 mg/dL 9.2  8.7    CEA pending CA-125 pending  CT A/P on 11/9: IMPRESSION: 1. 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 Gynecology consult is advised. MRI without and with contrast may provide better characterization. 2. Skin thickening and stranding of the paraumbilical subcutaneous fat. Findings may represent  a strangulated or incarcerated small fat containing umbilical hernia or inflammatory changes of the skin/cellulitis. No drainable fluid collection/abscess. 3. No bowel obstruction. Normal appendix. 4. Fatty liver. 5.  Aortic Atherosclerosis (ICD10-I70.0)  ASSESSMENT AND PLAN: Deeanne Deininger is a 56 year old with an adnexal mass, suspect ovarian torsion given acute worsening of her symptoms.  No evidence of metastatic disease on imaging.  No obvious bowel-containing hernia, so I suspect this is not significantly contributing to her presentation.   I discussed with the patient and her partner my recommendation for urgent evaluation with surgery.  Given her other's history of ovarian cancer in her postmenopausal status, I recommend consideration of BSO.  Plan would be to send the enlarged ovary for frozen section.  If borderline or malignant, additional staging procedures would be performed as indicated.   Tumor markers have been drawn and are pending.   Despite multiple conversations with the patient, she declined surgery today, stating that she wants to wait until tomorrow.  We will plan for admission with surgery tomorrow afternoon.  Patient will have a diet ordered until midnight.   She had hospitalist assistance given her COPD as well as history of substance abuse now controlled on methadone.  Eugene Garnet MD Gynecologic Oncology

## 2022-01-08 NOTE — Anesthesia Procedure Notes (Signed)
Procedure Name: Intubation Date/Time: 01/08/2022 3:33 PM  Performed by: Elyn Peers, CRNAPre-anesthesia Checklist: Patient identified, Emergency Drugs available, Suction available, Patient being monitored and Timeout performed Patient Re-evaluated:Patient Re-evaluated prior to induction Oxygen Delivery Method: Circle system utilized Preoxygenation: Pre-oxygenation with 100% oxygen Induction Type: IV induction, Rapid sequence and Cricoid Pressure applied Laryngoscope Size: Miller and 3 Grade View: Grade I Tube type: Oral Tube size: 7.0 mm Number of attempts: 1 Airway Equipment and Method: Stylet Placement Confirmation: ETT inserted through vocal cords under direct vision, positive ETCO2 and breath sounds checked- equal and bilateral Secured at: 23 cm Tube secured with: Tape Dental Injury: Teeth and Oropharynx as per pre-operative assessment  Comments: Loose tooth noted to lower left side of the mouth prior to intubation.  Tooth remains intact post intubation.

## 2022-01-08 NOTE — Anesthesia Postprocedure Evaluation (Signed)
Anesthesia Post Note  Patient: Meghan Contreras  Procedure(s) Performed: XI ROBOTIC ASSISTED BILATERAL SALPINGO OOPHORECTOMY (Abdomen)     Patient location during evaluation: PACU Anesthesia Type: General Level of consciousness: awake and alert Pain management: pain level controlled Vital Signs Assessment: post-procedure vital signs reviewed and stable Respiratory status: spontaneous breathing, nonlabored ventilation, respiratory function stable and patient connected to nasal cannula oxygen Cardiovascular status: blood pressure returned to baseline and stable Postop Assessment: no apparent nausea or vomiting Anesthetic complications: no   No notable events documented.  Last Vitals:  Vitals:   01/08/22 1830 01/08/22 1845  BP: (!) 149/87 (!) 146/93  Pulse: 99 100  Resp: 10 12  Temp:    SpO2: 91% 92%    Last Pain:  Vitals:   01/08/22 1815  TempSrc:   PainSc: 6                  Shelton Silvas

## 2022-01-08 NOTE — Interval H&P Note (Signed)
History and Physical Interval Note:  01/08/2022 3:10 PM  Meghan Contreras  has presented today for surgery, with the diagnosis of ADNEXAL MASS.  The various methods of treatment have been discussed with the patient and family. After consideration of risks, benefits and other options for treatment, the patient has consented to  Procedure(s): XI ROBOTIC ASSISTED BILATERAL SALPINGO OOPHORECTOMY; POSSIBLE STAGING (N/A) as a surgical intervention.  The patient's history has been reviewed, patient examined, no change in status, stable for surgery.  I have reviewed the patient's chart and labs.  Questions were answered to the patient's satisfaction.     Carver Fila

## 2022-01-08 NOTE — Discharge Instructions (Signed)
01/08/2022  Return to work: 4-6 weeks if applicable  During surgery, Dr. Pricilla Holm removed both ovaries and fallopian tubes. The ovarian mass was on the left ovary and it had twisted on its blood supply which is called an ovarian torsion.  You will need to complete the full course of antibiotics for treatment of pneumonia.  Activity: 1. Be up and out of the bed during the day.  Take a nap if needed.  You may walk up steps but be careful and use the hand rail.  Stair climbing will tire you more than you think, you may need to stop part way and rest.   2. No lifting or straining over 10 lbs, pushing, pulling, straining for 6 weeks.  3. No driving for around 1 week(s).  Do not drive if you are taking narcotic pain medicine. You need to make sure your reaction time has returned and you can brake safely.  4. Shower daily.  Use your regular soap to bathe and when finished pat your incision dry; don't rub.  No tub baths until cleared by your surgeon.   5. No sexual activity and nothing in the vagina for 4 weeks.  6. You may experience a small amount of clear drainage from your incisions, which is normal.  If the drainage persists or increases, please call the office.  7. You may experience vaginal spotting after surgery.  The spotting is normal but if you experience heavy bleeding, call our office.  8. Take Tylenol or ibuprofen first for pain IF YOU ARE ABLE TO TAKE THESE MEDICATIONS and only use narcotic pain medication for severe pain not relieved by the Tylenol or Ibuprofen.  Monitor your Tylenol intake to a max of 4,000 mg.   Diet: 1. Low sodium Heart Healthy Diet is recommended.  2. It is safe to use a laxative, such as Miralax or Colace, if you have difficulty moving your bowels. You can take Sennakot at bedtime every evening to keep bowel movements regular and to prevent constipation.    Wound Care: 1. Keep clean and dry.  Shower daily.  Reasons to call the Doctor: Fever - Oral  temperature greater than 100.4 degrees Fahrenheit Foul-smelling vaginal discharge Difficulty urinating Nausea and vomiting Increased pain at the site of the incision that is unrelieved with pain medicine. Difficulty breathing with or without chest pain New calf pain especially if only on one side Sudden, continuing increased vaginal bleeding with or without clots.   Contacts: For questions or concerns you should contact:  Dr. Eugene Garnet at [phone]  Warner Mccreedy, NP at [phone]  After Hours: call [phone] and have the GYN Oncologist paged/contacted

## 2022-01-08 NOTE — Anesthesia Preprocedure Evaluation (Addendum)
Anesthesia Evaluation  Patient identified by MRN, date of birth, ID band Patient awake    Reviewed: Allergy & Precautions, NPO status , Patient's Chart, lab work & pertinent test results  Airway Mallampati: I  TM Distance: >3 FB Neck ROM: Full    Dental  (+) Poor Dentition, Missing, Loose, Chipped, Dental Advisory Given   Pulmonary COPD,  COPD inhaler, Current Smoker and Patient abstained from smoking.    + decreased breath sounds      Cardiovascular negative cardio ROS  Rhythm:Regular Rate:Normal     Neuro/Psych  PSYCHIATRIC DISORDERS Anxiety  Bipolar Disorder   negative neurological ROS     GI/Hepatic negative GI ROS, Neg liver ROS,,,  Endo/Other  Hypothyroidism    Renal/GU      Musculoskeletal   Abdominal   Peds  Hematology   Anesthesia Other Findings   Reproductive/Obstetrics                              Anesthesia Physical Anesthesia Plan  ASA: 3  Anesthesia Plan: General   Post-op Pain Management: Ketamine IV*, Ofirmev IV (intra-op)* and Toradol IV (intra-op)*   Induction: Intravenous  PONV Risk Score and Plan: 4 or greater and Ondansetron, Dexamethasone, Midazolam and Scopolamine patch - Pre-op  Airway Management Planned: Oral ETT  Additional Equipment: None  Intra-op Plan:   Post-operative Plan: Extubation in OR  Informed Consent: I have reviewed the patients History and Physical, chart, labs and discussed the procedure including the risks, benefits and alternatives for the proposed anesthesia with the patient or authorized representative who has indicated his/her understanding and acceptance.     Dental advisory given  Plan Discussed with: CRNA  Anesthesia Plan Comments: (- 2 IV's)        Anesthesia Quick Evaluation

## 2022-01-08 NOTE — Progress Notes (Signed)
PROGRESS NOTE    Meghan Contreras  UKG:[id number] DOB: 1965/12/22 DOA: 01/06/2022 PCP: Julieanne Manson, MD    Brief Narrative:   Meghan Contreras is a 56 y.o. female with past medical history significant for COPD, bipolar disorder, PTSD, hypothyroidism, opioid dependence who presented to [facility] ED on 11/9 with complaints of abdominal pain.  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  Patient was admitted to the GYN/Oncology service.  Hospitalist service consulted for assistance with management of her chronic comorbidities.  Assessment & Plan:   Adnexal mass concerning for ovarian neoplasm Patient presenting to ED with progressive left lower quadrant abdominal pain.  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  CEA elevated at 8.7, CA125 12.8, within normal limits. GYN/Onc plans for robotic assisted bilateral salpingo-oophorectomy with possible staging today. --Further per GYN/Onc  COPD Not in acute exacerbation.  Oxygen well on room air.  On trilogy Ellipta outpatient.  Bipolar disorder PTSD -- Continue home Seroquel 100 mg p.o. every morning, 300 mg p.o. qHS -- Prazosin 4 mg p.o. nightly  Hypothyroidism -- Levothyroxine 137 mcg p.o. daily  Opioid dependence On methadone outpatient. --Pharmacy to verify dosing prior to restarting -- Currently on oxycodone 5-10 mg p.o. every 4 hours as needed moderate pain and Dilaudid 0.5 g IV every 4 hours as needed severe pain     DVT prophylaxis: SCD's Start: 01/08/22 0854    Code Status: Full Code Family Communication: Brother present at bedside this morning  Disposition Plan:  Level of care: Med-Surg Status is: Inpatient Remains inpatient appropriate because: Pending surgical intervention, further dependent on primary service    Subjective: Patient seen examined  bedside, resting comfortably.  Sitting in bedside chair.  Continues with mild left lower quadrant abdominal discomfort.  Brother and RN present at bedside.  Requesting restart of methadone, does not know current dose; discussed with RN to message pharmacy for verification before restarting.  Anxious for surgery later today.  No other questions or concerns at this time.  Denies headache, no dizziness, no chest pain, no palpitations, no shortness of breath, no fever/chills/night sweats, no nausea/vomiting/diarrhea, no focal weakness, no fatigue, no paresthesias.  No acute events overnight per nursing staff.  Objective: Vitals:   01/08/22 0214 01/08/22 0628 01/08/22 0810 01/08/22 0845  BP: 128/79 (!) 148/101  112/87  Pulse: 66 73  93  Resp: 18 18  17   Temp: 98.6 F (37 C) 99.3 F (37.4 C)  98.8 F (37.1 C)  TempSrc: Oral Oral  Oral  SpO2: 95% 94% 92% 97%  Weight:      Height:        Intake/Output Summary (Last 24 hours) at 01/08/2022 1044 Last data filed at 01/08/2022 0830 Gross per 24 hour  Intake 2550 ml  Output 200 ml  Net 2350 ml   Filed Weights   01/07/22 2305  Weight: 84.1 kg    Examination:  Physical Exam: GEN: NAD, alert and oriented x 3, wd/wn HEENT: NCAT, PERRL, EOMI, sclera clear, MMM PULM: CTAB w/o wheezes/crackles, normal respiratory effort, on room air CV: RRR w/o M/G/R GI: abd soft, mild left lower quadrant tenderness on palpation, nondistended, NABS, no R/G/M MSK: no peripheral edema, muscle strength globally intact 5/5 bilateral upper/lower extremities NEURO: CN II-XII intact, no focal deficits, sensation to light touch intact PSYCH: normal mood/affect Integumentary: dry/intact, no rashes  or wounds    Data Reviewed: I have personally reviewed following labs and imaging studies  CBC: Recent Labs  Lab 01/06/22 2235 01/07/22 1637 01/08/22 0747  WBC 9.9 9.4 9.3  NEUTROABS 5.8 5.3  --   HGB 11.5* 12.2 11.6*  HCT 35.5* 37.2 36.0  MCV 82.9 81.2 82.2   PLT 300 300 285   Basic Metabolic Panel: Recent Labs  Lab 01/06/22 2235 01/07/22 1637 01/08/22 0747  NA 141 143 141  K 4.3 3.8 4.1  CL 105 109 108  CO2 27 30 30   GLUCOSE 149* 108* 106*  BUN 18 16 17   CREATININE 1.26* 0.99 0.98  CALCIUM 8.7* 9.2 9.3   GFR: Estimated Creatinine Clearance: 76.5 mL/min (by C-G formula based on SCr of 0.98 mg/dL). Liver Function Tests: Recent Labs  Lab 01/06/22 2235 01/07/22 1637  AST 17 17  ALT 17 15  ALKPHOS 69 70  BILITOT 0.4 0.5  PROT 7.9 8.8*  ALBUMIN 3.9 4.1   No results for input(s): "LIPASE", "AMYLASE" in the last 168 hours. No results for input(s): "AMMONIA" in the last 168 hours. Coagulation Profile: No results for input(s): "INR", "PROTIME" in the last 168 hours. Cardiac Enzymes: No results for input(s): "CKTOTAL", "CKMB", "CKMBINDEX", "TROPONINI" in the last 168 hours. BNP (last 3 results) No results for input(s): "PROBNP" in the last 8760 hours. HbA1C: Recent Labs    01/07/22 1639  HGBA1C 5.8*   CBG: No results for input(s): "GLUCAP" in the last 168 hours. Lipid Profile: No results for input(s): "CHOL", "HDL", "LDLCALC", "TRIG", "CHOLHDL", "LDLDIRECT" in the last 72 hours. Thyroid Function Tests: No results for input(s): "TSH", "T4TOTAL", "FREET4", "T3FREE", "THYROIDAB" in the last 72 hours. Anemia Panel: Recent Labs    01/07/22 1638  TIBC 416  IRON 102   Sepsis Labs: No results for input(s): "PROCALCITON", "LATICACIDVEN" in the last 168 hours.  Recent Results (from the past 240 hour(s))  Surgical pcr screen     Status: None   Collection Time: 01/08/22  7:46 AM   Specimen: Nasal Mucosa; Nasal Swab  Result Value Ref Range Status   MRSA, PCR NEGATIVE NEGATIVE Final   Staphylococcus aureus NEGATIVE NEGATIVE Final    Comment: (NOTE) The Xpert SA Assay (FDA approved for NASAL specimens in patients 62 years of age and older), is one component of a comprehensive surveillance program. It is not intended to  diagnose infection nor to guide or monitor treatment. Performed at [facility], [address] [place], [place]          Radiology Studies: CT ABDOMEN PELVIS W CONTRAST  Result Date: 01/07/2022 CLINICAL DATA:  Left lower quadrant abdominal pain. EXAM: CT ABDOMEN AND PELVIS WITH CONTRAST TECHNIQUE: Multidetector CT imaging of the abdomen and pelvis was performed using the standard protocol following bolus administration of intravenous contrast. RADIATION DOSE REDUCTION: This exam was performed according to the departmental dose-optimization program which includes automated exposure control, adjustment of the mA and/or kV according to patient size and/or use of iterative reconstruction technique. CONTRAST:  19mL OMNIPAQUE IOHEXOL 300 MG/ML  SOLN COMPARISON:  None Available. FINDINGS: Lower chest: There is partial atelectasis of the right middle lobe. No intra-abdominal free air.  Small free fluid in the pelvis. Hepatobiliary: Fatty liver. No biliary dilatation. The gallbladder is unremarkable Pancreas: Unremarkable. No pancreatic ductal dilatation or surrounding inflammatory changes. Spleen: Normal in size without focal abnormality. Adrenals/Urinary Tract: The adrenal glands are unremarkable. Multiple bilateral renal cysts measure up to 3 cm  in the interpolar left kidney. Several smaller cystic lesions are suboptimally characterized on this CT. Further evaluation with MRI without and with contrast on a nonemergent/outpatient basis recommended. There is no hydronephrosis on either side. There is symmetric enhancement and excretion of contrast by both kidneys. The visualized ureters and urinary bladder appear unremarkable. Stomach/Bowel: There is moderate stool throughout the colon. There is no bowel obstruction or active inflammation. The appendix is normal. Vascular/Lymphatic: Mild aortoiliac atherosclerotic disease. The IVC is unremarkable. No portal venous gas. There is  no adenopathy. Reproductive: The uterus is anteverted. There is a small anterior body fibroid. 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 Gynecology consult is advised. Other: There is skin thickening and stranding of the paraumbilical subcutaneous fat. There may be a small fat containing umbilical hernia. This is however poorly seen due to edema. In the absence of recent procedure, findings may represent a strangulated or incarcerated small fat containing umbilical hernia or represent inflammatory changes of the skin/cellulitis. Clinical correlation is recommended. No drainable fluid collection/abscess. Musculoskeletal: No acute or significant osseous findings. IMPRESSION: 1. 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 Gynecology consult is advised. MRI without and with contrast may provide better characterization. 2. Skin thickening and stranding of the paraumbilical subcutaneous fat. Findings may represent a strangulated or incarcerated small fat containing umbilical hernia or inflammatory changes of the skin/cellulitis. No drainable fluid collection/abscess. 3. No bowel obstruction. Normal appendix. 4. Fatty liver. 5.  Aortic Atherosclerosis (ICD10-I70.0). Electronically Signed   By: Elgie Collard M.D.   On: 01/07/2022 00:37        Scheduled Meds:  Chlorhexidine Gluconate Cloth  6 each Topical To OR   fluticasone furoate-vilanterol  1 puff Inhalation Daily   And   umeclidinium bromide  1 puff Inhalation Daily   levothyroxine  137 mcg Oral QAC breakfast   nicotine  21 mg Transdermal Daily   polyethylene glycol  17 g Oral Daily   QUEtiapine  100 mg Oral Daily   QUEtiapine  300 mg Oral QHS   Continuous Infusions:  dextrose 5 % and 0.45 % NaCl with KCl 10 mEq/L 50 mL/hr at 01/08/22 0028     LOS: 1 day    Time spent: 52 minutes spent on chart review, discussion  with nursing staff, consultants, updating family and interview/physical exam; more than 50% of that time was spent in counseling and/or coordination of care.    Alvira Philips [place], DO [facility] Available via Epic secure chat 7am-7pm After these hours, please refer to coverage provider listed on [email or web address] 01/08/2022, 10:44 AM

## 2022-01-09 ENCOUNTER — Encounter (HOSPITAL_COMMUNITY): Payer: Self-pay | Admitting: Gynecologic Oncology

## 2022-01-09 ENCOUNTER — Inpatient Hospital Stay (HOSPITAL_COMMUNITY): Payer: Self-pay

## 2022-01-09 LAB — BASIC METABOLIC PANEL
Anion gap: 5 (ref 5–15)
Anion gap: 7 (ref 5–15)
BUN: 15 mg/dL (ref 6–20)
BUN: 22 mg/dL — ABNORMAL HIGH (ref 6–20)
CO2: 27 mmol/L (ref 22–32)
CO2: 30 mmol/L (ref 22–32)
Calcium: 8.3 mg/dL — ABNORMAL LOW (ref 8.9–10.3)
Calcium: 9.2 mg/dL (ref 8.9–10.3)
Chloride: 106 mmol/L (ref 98–111)
Chloride: 103 mmol/L (ref 98–111)
Creatinine, Ser: 0.91 mg/dL (ref 0.44–1.00)
Creatinine, Ser: 1.2 mg/dL — ABNORMAL HIGH (ref 0.44–1.00)
GFR, Estimated: >60 mL/min (ref >60)
GFR, Estimated: 53 mL/min — ABNORMAL LOW (ref >60)
Glucose, Bld: 240 mg/dL — ABNORMAL HIGH (ref 70–99)
Glucose, Bld: 104 mg/dL — ABNORMAL HIGH (ref 70–99)
Potassium: 4.3 mmol/L (ref 3.5–5.1)
Potassium: 3.9 mmol/L (ref 3.5–5.1)
Sodium: 138 mmol/L (ref 135–145)
Sodium: 140 mmol/L (ref 135–145)

## 2022-01-09 LAB — CBC
HCT: 30.3 % — ABNORMAL LOW (ref 36.0–46.0)
Hemoglobin: 9.9 g/dL — ABNORMAL LOW (ref 12.0–15.0)
MCH: 26.4 pg (ref 26.0–34.0)
MCHC: 32.7 g/dL (ref 30.0–36.0)
MCV: 80.8 fL (ref 80.0–100.0)
Platelets: 254 10*3/uL (ref 150–400)
RBC: 3.75 MIL/uL — ABNORMAL LOW (ref 3.87–5.11)
RDW: 13.9 % (ref 11.5–15.5)
WBC: 11.2 10*3/uL — ABNORMAL HIGH (ref 4.0–10.5)
nRBC: 0 % (ref 0.0–0.2)

## 2022-01-09 MED ORDER — HYDROMORPHONE HCL 1 MG/ML IJ SOLN: 0.5000 mg | INTRAMUSCULAR | Status: DC | PRN

## 2022-01-09 MED ORDER — ALBUTEROL SULFATE (2.5 MG/3ML) 0.083% IN NEBU
3.0000 mL | INHALATION_SOLUTION | RESPIRATORY_TRACT | Status: DC | PRN
  Administered 2022-01-09: 3 mL via RESPIRATORY_TRACT
  Filled 2022-01-09: qty 3

## 2022-01-09 MED ORDER — QUETIAPINE FUMARATE 50 MG PO TABS
150.0000 mg | ORAL_TABLET | Freq: Once | ORAL | Status: AC
  Administered 2022-01-09: 150 mg via ORAL

## 2022-01-09 MED ORDER — FUROSEMIDE 10 MG/ML IJ SOLN
40.0000 mg | Freq: Once | INTRAMUSCULAR | Status: AC
  Administered 2022-01-09: 40 mg via INTRAVENOUS
  Filled 2022-01-09: qty 4

## 2022-01-09 MED ORDER — PRAZOSIN HCL 1 MG PO CAPS
2.0000 mg | ORAL_CAPSULE | Freq: Once | ORAL | Status: AC
  Administered 2022-01-09: 2 mg via ORAL
  Filled 2022-01-09: qty 2

## 2022-01-09 NOTE — Progress Notes (Signed)
Patient requested to take only half of her night medications.  NP Garner Nash was notified and he said ok to it.  So Minipress 2 mg and  150 mg Seroquel.

## 2022-01-09 NOTE — TOC Progression Note (Deleted)
Transition of Care [facility]) - Progression Note    Patient Details  Name: Meghan Contreras MRN: [id number] Date of Birth: [birth year]  Transition of Care [facility]) CM/SW Contact  Adrian Prows, RN Phone Number: 01/09/2022, 1:13 PM  Clinical Narrative:     Transition of Care [facility]) Screening Note   Patient Details  Name: Meghan Contreras Date of Birth: 07-Mar-1965   Transition of Care [facility]) CM/SW Contact:    Adrian Prows, RN Phone Number: 01/09/2022, 1:13 PM    Transition of Care Department [facility] has reviewed patient and no TOC needs have been identified at this time. We will continue to monitor patient advancement through interdisciplinary progression rounds. If new patient transition needs arise, please place a TOC consult.          Expected Discharge Plan and Services                                                 Social Determinants of Health (SDOH) Interventions Food Insecurity Interventions: Intervention Not Indicated Housing Interventions: Intervention Not Indicated Transportation Interventions: Intervention Not Indicated Utilities Interventions: Intervention Not Indicated  Readmission Risk Interventions     No data to display

## 2022-01-09 NOTE — Progress Notes (Signed)
SATURATION QUALIFICATIONS: (This note is used to comply with regulatory documentation for home oxygen)  Patient Saturations on Room Air at Rest = 95%  Patient Saturations on Room Air while Ambulating = 91%  Patient Saturations on 0 Liters of oxygen while Ambulating = 91%  Please briefly explain why patient needs home oxygen:  While ambulating pt fluctuated between  88-93% on RA.

## 2022-01-09 NOTE — Progress Notes (Signed)
PROGRESS NOTE    Meghan Contreras  [id number] DOB: 01/21/1966 DOA: 01/06/2022 PCP: Mack Hook, MD    Brief Narrative:   Meghan Contreras is a 56 y.o. female with past medical history significant for COPD, bipolar disorder, PTSD, hypothyroidism, opioid dependence who presented to [facility] ED on 11/9 with complaints of abdominal pain.  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  Patient was admitted to the GYN/Oncology service.  Hospitalist service consulted for assistance with management of her chronic comorbidities.  Assessment & Plan:   Bilateral ovarian torsion with necrosis s/p robotic assisted bilateral salpingo oophorectomy Patient presenting to ED with progressive left lower quadrant abdominal pain.  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  CEA elevated at 8.7, CA125 12.8, within normal limits.  Underwent robotic assisted bilateral salpingo-oophorectomy by GYN/Onc Dr. Berline Lopes on 01/09/2022 --Pathology: Pending --Further per GYN/Onc  COPD Not in acute exacerbation.  On trilogy Ellipta outpatient. -- Breo elliptica 1 puff daily, Incruse Ellipta 1 puff daily as hospital substitution -- Albuterol/duo neb every 4 hours as needed for shortness of breath/wheezing -- Chest x-ray with some vascular congestion, will give one-time dose of Lasix 40 mg IV and will obtain ambulatory O2 screen -- Wean O2 as able, maintain SPO2 greater than 88% -- Incentive spirometry  Bipolar disorder PTSD -- Continue home Seroquel 100 mg p.o. every morning, 300 mg p.o. qHS -- Prazosin 4 mg p.o. nightly  Hypothyroidism -- Levothyroxine 137 mcg p.o. daily  Opioid dependence --Continue home methadone     DVT prophylaxis: enoxaparin (LOVENOX) injection 40 mg Start: 01/09/22 0800 SCDs Start: 01/08/22 1812    Code Status: Full  Code Family Communication: Brother present at bedside this morning  Disposition Plan:  Level of care: Med-Surg Status is: Inpatient Remains inpatient appropriate because: Per primary service, GYN  Subjective: Patient seen examined bedside, resting comfortably.  Lying in bed.  No specific complaints this morning other than if she can have something to eat this morning.  Remains on oxygen postoperatively.  Chest x-ray ordered by GYN this morning with some vascular congestion/atelectasis, low suspicion for infectious etiology.  We will stop IV fluids, give a dose of IV Lasix.  No other questions or concerns at this time.  Denies headache, no dizziness, no chest pain, no palpitations, no shortness of breath, no fever/chills/night sweats, no nausea/vomiting/diarrhea, no focal weakness, no fatigue, no paresthesias.  No acute events overnight per nursing staff.  Objective: Vitals:   01/08/22 2237 01/09/22 0104 01/09/22 0521 01/09/22 0926  BP: 129/89 108/79 94/68 108/72  Pulse: 72 72 72 71  Resp: 20 16 16 16   Temp: 99.3 F (37.4 C) 98.4 F (36.9 C) 98.6 F (37 C) 98.4 F (36.9 C)  TempSrc: Oral Oral Oral Oral  SpO2: 96%  94% 96%  Weight:      Height:        Intake/Output Summary (Last 24 hours) at 01/09/2022 1208 Last data filed at 01/08/2022 2200 Gross per 24 hour  Intake 1346.7 ml  Output 950 ml  Net 396.7 ml   Filed Weights   01/07/22 2305  Weight: 84.1 kg    Examination:  Physical Exam: GEN: NAD, alert and oriented x 3, wd/wn HEENT: NCAT, PERRL, EOMI, sclera clear, MMM PULM: CTAB w/o wheezes/crackles, normal respiratory effort, on 2 L nasal cannula with SPO2 96% at rest  CV: RRR w/o M/G/R GI: abd soft, mild left lower quadrant tenderness on palpation, nondistended, NABS, no R/G/M MSK: no peripheral edema, muscle strength globally intact 5/5 bilateral upper/lower extremities NEURO: CN II-XII intact, no focal deficits, sensation to light touch intact PSYCH: normal  mood/affect Integumentary: dry/intact, no rashes or wounds    Data Reviewed: I have personally reviewed following labs and imaging studies  CBC: Recent Labs  Lab 01/06/22 2235 01/07/22 1637 01/08/22 0747 01/09/22 0912  WBC 9.9 9.4 9.3 11.2*  NEUTROABS 5.8 5.3  --   --   HGB 11.5* 12.2 11.6* 9.9*  HCT 35.5* 37.2 36.0 30.3*  MCV 82.9 81.2 82.2 80.8  PLT 300 300 285 0000000   Basic Metabolic Panel: Recent Labs  Lab 01/06/22 2235 01/07/22 1637 01/08/22 0747 01/09/22 0912  NA 141 143 141 138  K 4.3 3.8 4.1 4.3  CL 105 109 108 106  CO2 27 30 30 27   GLUCOSE 149* 108* 106* 240*  BUN 18 16 17 15   CREATININE 1.26* 0.99 0.98 0.91  CALCIUM 8.7* 9.2 9.3 8.3*   GFR: Estimated Creatinine Clearance: 82.4 mL/min (by C-G formula based on SCr of 0.91 mg/dL). Liver Function Tests: Recent Labs  Lab 01/06/22 2235 01/07/22 1637  AST 17 17  ALT 17 15  ALKPHOS 69 70  BILITOT 0.4 0.5  PROT 7.9 8.8*  ALBUMIN 3.9 4.1   No results for input(s): "LIPASE", "AMYLASE" in the last 168 hours. No results for input(s): "AMMONIA" in the last 168 hours. Coagulation Profile: No results for input(s): "INR", "PROTIME" in the last 168 hours. Cardiac Enzymes: No results for input(s): "CKTOTAL", "CKMB", "CKMBINDEX", "TROPONINI" in the last 168 hours. BNP (last 3 results) No results for input(s): "PROBNP" in the last 8760 hours. HbA1C: Recent Labs    01/07/22 1639  HGBA1C 5.8*   CBG: No results for input(s): "GLUCAP" in the last 168 hours. Lipid Profile: No results for input(s): "CHOL", "HDL", "LDLCALC", "TRIG", "CHOLHDL", "LDLDIRECT" in the last 72 hours. Thyroid Function Tests: No results for input(s): "TSH", "T4TOTAL", "FREET4", "T3FREE", "THYROIDAB" in the last 72 hours. Anemia Panel: Recent Labs    01/07/22 1638  TIBC 416  IRON 102   Sepsis Labs: No results for input(s): "PROCALCITON", "LATICACIDVEN" in the last 168 hours.  Recent Results (from the past 240 hour(s))  Surgical pcr  screen     Status: None   Collection Time: 01/08/22  7:46 AM   Specimen: Nasal Mucosa; Nasal Swab  Result Value Ref Range Status   MRSA, PCR NEGATIVE NEGATIVE Final   Staphylococcus aureus NEGATIVE NEGATIVE Final    Comment: (NOTE) The Xpert SA Assay (FDA approved for NASAL specimens in patients 19 years of age and older), is one component of a comprehensive surveillance program. It is not intended to diagnose infection nor to guide or monitor treatment. Performed at [facility] [place] [address], [place], [place] [address]          Radiology Studies: DG Chest 2 View  Result Date: 01/09/2022 CLINICAL DATA:  Dyspnea EXAM: CHEST - 2 VIEW COMPARISON:  12/08/2021 FINDINGS: Cardiac size is within normal limits. There are patchy infiltrates in both lower lung fields with interval increase. There is blunting of both lateral CP angles. There is no pneumothorax. IMPRESSION: Increased linear patchy infiltrates are seen in both lower lung fields suggesting worsening of atelectasis/pneumonia. Small bilateral pleural effusions. Electronically Signed   By: Elmer Picker M.D.   On: 01/09/2022 11:57  Scheduled Meds:  enoxaparin (LOVENOX) injection  40 mg Subcutaneous Q24H   fluticasone furoate-vilanterol  1 puff Inhalation Daily   And   umeclidinium bromide  1 puff Inhalation Daily   furosemide  40 mg Intravenous Once   levothyroxine  137 mcg Oral QAC breakfast   methadone  55 mg Oral Daily   nicotine  21 mg Transdermal Daily   polyethylene glycol  17 g Oral Daily   prazosin  4 mg Oral QHS   QUEtiapine  100 mg Oral Daily   QUEtiapine  300 mg Oral QHS   Continuous Infusions:     LOS: 2 days    Time spent: 48 minutes spent on chart review, discussion with nursing staff, consultants, updating family and interview/physical exam; more than 50% of that time was spent in counseling and/or coordination of care.    Alvira Philips [place], DO [facility] Available via Epic secure chat 7am-7pm After these hours, please refer to coverage provider listed on [email or web address] 01/09/2022, 12:08 PM

## 2022-01-09 NOTE — Progress Notes (Addendum)
1 Day Post-Op Procedure(s) (LRB): XI ROBOTIC ASSISTED BILATERAL SALPINGO OOPHORECTOMY (N/A)  Subjective: Patient reports C/O "shallow breathing".  No pain, N/V  Objective: Vital signs in last 24 hours: Temp:  [98.4 F (36.9 C)-100.5 F (38.1 C)] 98.4 F (36.9 C) (11/11 0926) Pulse Rate:  [71-113] 71 (11/11 0926) Resp:  [10-20] 16 (11/11 0926) BP: (94-157)/(68-98) 108/72 (11/11 0926) SpO2:  [91 %-100 %] 96 % (11/11 0926) Last BM Date : 01/07/22  Intake/Output from previous day: 11/10 0701 - 11/11 0700 In: 1826.7 [P.O.:510; I.V.:1216.7; IV Piggyback:100] Out: 1150 [Urine:1100; Blood:50]  Physical Examination: General: alert Resp: slightly diminished respiratory effort GI: soft, non-tender; bowel sounds normal; no masses,  no organomegaly and incision: clean, dry, and intact Extremities: extremities normal, atraumatic, no cyanosis or edema and Homans sign is negative, no sign of DVT  Labs: WBC/Hgb/Hct/Plts:  11.2/9.9/30.3/254 (11/11 2951) BUN/Cr/glu/ALT/AST/amyl/lip:  15/0.91/--/--/--/--/-- (11/11 8841)   Assessment:  56 y.o. s/p Procedure(s): XI ROBOTIC ASSISTED BILATERAL SALPINGO OOPHORECTOMY: stable Pain:  Pain is well-controlled on oral medications.  Heme: Anemia--downward trend in hgb c/w intra-op loss  GI:  Tolerating po: Yes     FEN: Electrolytes in range  Pulm: H/O COPD; Increased respiratory effort.  O2 sats in range ofn [place]  Prophylaxis: pharmacologic prophylaxis (with any of the following: enoxaparin (Lovenox) 40mg  SQ 2 hours prior to surgery then every day) and intermittent pneumatic compression boots.  Plan: Review the CXR findings Continue to optimize pulmonary toilet Encourage ambulation Anticipate discharge to home tomorrow   LOS: 2 days    01/09/2022, 10:16 AM

## 2022-01-10 ENCOUNTER — Inpatient Hospital Stay (HOSPITAL_COMMUNITY): Payer: Self-pay

## 2022-01-10 LAB — CBC WITH DIFFERENTIAL/PLATELET
Abs Immature Granulocytes: 0.05 10*3/uL (ref 0.00–0.07)
Basophils Absolute: 0 10*3/uL (ref 0.0–0.1)
Basophils Relative: 0 %
Eosinophils Absolute: 0 10*3/uL (ref 0.0–0.5)
Eosinophils Relative: 0 %
HCT: 30.1 % — ABNORMAL LOW (ref 36.0–46.0)
Hemoglobin: 9.8 g/dL — ABNORMAL LOW (ref 12.0–15.0)
Immature Granulocytes: 0 %
Lymphocytes Relative: 14 %
Lymphs Abs: 1.8 10*3/uL (ref 0.7–4.0)
MCH: 26.6 pg (ref 26.0–34.0)
MCHC: 32.6 g/dL (ref 30.0–36.0)
MCV: 81.6 fL (ref 80.0–100.0)
Monocytes Absolute: 0.6 10*3/uL (ref 0.1–1.0)
Monocytes Relative: 4 %
Neutro Abs: 10.6 10*3/uL — ABNORMAL HIGH (ref 1.7–7.7)
Neutrophils Relative %: 82 %
Platelets: 232 10*3/uL (ref 150–400)
RBC: 3.69 MIL/uL — ABNORMAL LOW (ref 3.87–5.11)
RDW: 14 % (ref 11.5–15.5)
WBC: 13.1 10*3/uL — ABNORMAL HIGH (ref 4.0–10.5)
nRBC: 0 % (ref 0.0–0.2)

## 2022-01-10 LAB — BASIC METABOLIC PANEL
Anion gap: 9 (ref 5–15)
Anion gap: 13 (ref 5–15)
Anion gap: 11 (ref 5–15)
BUN: 31 mg/dL — ABNORMAL HIGH (ref 6–20)
BUN: 32 mg/dL — ABNORMAL HIGH (ref 6–20)
BUN: 29 mg/dL — ABNORMAL HIGH (ref 6–20)
CO2: 28 mmol/L (ref 22–32)
CO2: 24 mmol/L (ref 22–32)
CO2: 26 mmol/L (ref 22–32)
Calcium: 8.4 mg/dL — ABNORMAL LOW (ref 8.9–10.3)
Calcium: 8.3 mg/dL — ABNORMAL LOW (ref 8.9–10.3)
Calcium: 8.6 mg/dL — ABNORMAL LOW (ref 8.9–10.3)
Chloride: 103 mmol/L (ref 98–111)
Chloride: 101 mmol/L (ref 98–111)
Chloride: 101 mmol/L (ref 98–111)
Creatinine, Ser: 1.73 mg/dL — ABNORMAL HIGH (ref 0.44–1.00)
Creatinine, Ser: 1.81 mg/dL — ABNORMAL HIGH (ref 0.44–1.00)
Creatinine, Ser: 1.36 mg/dL — ABNORMAL HIGH (ref 0.44–1.00)
GFR, Estimated: 34 mL/min — ABNORMAL LOW (ref >60)
GFR, Estimated: 33 mL/min — ABNORMAL LOW (ref >60)
GFR, Estimated: 46 mL/min — ABNORMAL LOW (ref >60)
Glucose, Bld: 102 mg/dL — ABNORMAL HIGH (ref 70–99)
Glucose, Bld: 103 mg/dL — ABNORMAL HIGH (ref 70–99)
Glucose, Bld: 130 mg/dL — ABNORMAL HIGH (ref 70–99)
Potassium: 3.6 mmol/L (ref 3.5–5.1)
Potassium: 3.8 mmol/L (ref 3.5–5.1)
Potassium: 3.6 mmol/L (ref 3.5–5.1)
Sodium: 140 mmol/L (ref 135–145)
Sodium: 138 mmol/L (ref 135–145)
Sodium: 138 mmol/L (ref 135–145)

## 2022-01-10 LAB — PROCALCITONIN: Procalcitonin: 8.46 ng/mL

## 2022-01-10 LAB — D-DIMER, QUANTITATIVE: D-Dimer, Quant: 3.56 ug/mL-FEU — ABNORMAL HIGH (ref 0.00–0.50)

## 2022-01-10 LAB — MAGNESIUM: Magnesium: 1.7 mg/dL (ref 1.7–2.4)

## 2022-01-10 MED ORDER — SENNOSIDES-DOCUSATE SODIUM 8.6-50 MG PO TABS
1.0000 | ORAL_TABLET | Freq: Two times a day (BID) | ORAL | Status: DC
  Administered 2022-01-10 – 2022-01-11 (×3): 1 via ORAL
  Filled 2022-01-10 (×3): qty 1

## 2022-01-10 MED ORDER — SODIUM CHLORIDE 0.9 % IV SOLN
500.0000 mg | INTRAVENOUS | Status: DC
  Administered 2022-01-10 – 2022-01-11 (×2): 500 mg via INTRAVENOUS
  Filled 2022-01-10 (×2): qty 5

## 2022-01-10 MED ORDER — SODIUM CHLORIDE 0.9 % IV BOLUS
500.0000 mL | Freq: Once | INTRAVENOUS | Status: AC
  Administered 2022-01-10: 500 mL via INTRAVENOUS

## 2022-01-10 MED ORDER — GABAPENTIN 100 MG PO CAPS
100.0000 mg | ORAL_CAPSULE | Freq: Three times a day (TID) | ORAL | Status: DC | PRN
  Administered 2022-01-10 – 2022-01-11 (×4): 100 mg via ORAL
  Filled 2022-01-10 (×4): qty 1

## 2022-01-10 MED ORDER — LACTATED RINGERS IV BOLUS
250.0000 mL | Freq: Once | INTRAVENOUS | Status: AC
  Administered 2022-01-10: 250 mL via INTRAVENOUS

## 2022-01-10 MED ORDER — ACETAMINOPHEN 325 MG PO TABS
650.0000 mg | ORAL_TABLET | Freq: Four times a day (QID) | ORAL | Status: DC | PRN
  Administered 2022-01-10: 650 mg via ORAL
  Filled 2022-01-10: qty 2

## 2022-01-10 MED ORDER — SODIUM CHLORIDE 0.9 % IV SOLN
2.0000 g | INTRAVENOUS | Status: DC
  Administered 2022-01-10 – 2022-01-11 (×2): 2 g via INTRAVENOUS
  Filled 2022-01-10 (×2): qty 20

## 2022-01-10 MED ORDER — QUETIAPINE FUMARATE 50 MG PO TABS
100.0000 mg | ORAL_TABLET | Freq: Two times a day (BID) | ORAL | Status: DC
  Administered 2022-01-10 – 2022-01-11 (×3): 100 mg via ORAL
  Filled 2022-01-10 (×3): qty 2

## 2022-01-10 MED ORDER — ACETAMINOPHEN 650 MG RE SUPP: 650.0000 mg | Freq: Four times a day (QID) | RECTAL | Status: DC | PRN

## 2022-01-10 MED ORDER — LACTATED RINGERS IV BOLUS: 500.0000 mL | Freq: Once | INTRAVENOUS | Status: DC

## 2022-01-10 NOTE — Progress Notes (Signed)
Thank you, Rapid response nurse is here assessing patient. BP is now 97/50 pulse 114, we will give tylenol and follow recommended instructions.

## 2022-01-10 NOTE — Progress Notes (Signed)
SATURATION QUALIFICATIONS: (This note is used to comply with regulatory documentation for home oxygen)  Patient Saturations on Room Air at Rest = 94%  Patient Saturations on Room Air while Ambulating = 88%  Patient Saturations on 0 Liters of oxygen while Ambulating = 88%  Please briefly explain why patient needs home oxygen:

## 2022-01-10 NOTE — Progress Notes (Signed)
Dr. Tamela Oddi called to check on pt's status. Received order for BMET.

## 2022-01-10 NOTE — Progress Notes (Signed)
Rapid Response Event Note   Reason for Call : increased HR and decreased BP, with Temperature   Initial Focused Assessment: Pt A/O and f/c.  Pt barely can talk clearly.   Complains mouth is dry.  Breath sounds clear and abdomen is soft.  Bowel sounds intact.  Temperate noted.  Pt brother at her bedside and seems to be very involved in the patient's care.   Interventions: Cleaned pt mouth and pt sounds much better and clearer.  See MAR for orders.  Ice packs applied and pt received tylenol po. MD and NP at bedside, also.   Plan of Care: Continue to monitor and expressed to pt the importance of using her IS.  Pt will remain in current location per MD.    Event Summary:   MD Notified: yes Call Time: 0459 Arrival Time: 0110 End Time: 0200  Conley Rolls, RN

## 2022-01-10 NOTE — Progress Notes (Signed)
PROGRESS NOTE    Meghan Contreras  JOA:[id number] DOB: [birth year] DOA: 01/06/2022 PCP: Julieanne Manson, MD    Brief Narrative:   Meghan Contreras is a 56 y.o. female with past medical history significant for COPD, bipolar disorder, PTSD, hypothyroidism, opioid dependence who presented to [facility] ED on 11/9 with complaints of abdominal pain.  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  Patient was admitted to the GYN/Oncology service.  Hospitalist service consulted for assistance with management of her chronic comorbidities.  Assessment & Plan:   Bilateral ovarian torsion with necrosis s/p robotic assisted bilateral salpingo oophorectomy Patient presenting to ED with progressive left lower quadrant abdominal pain.  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  CEA elevated at 8.7, CA125 12.8, within normal limits.  Underwent robotic assisted bilateral salpingo-oophorectomy by GYN/Onc Dr. Pricilla Holm on 01/09/2022 --Pathology: Pending --Further per GYN/Onc  Hypotension: Resolved Overnight, patient's blood pressure noted to be 81/40.  Likely multifactorial with IV Lasix yesterday, prazosin last night as well as pain medication and high-dose Seroquel. -- IVF bolus with NS 500 mL -- Discontinue prazosin, reduce Seroquel to 100 mg p.o. twice daily and discontinue narcotics -- Continue monitor BP closely  Pneumonia, suspect gram-negative versus atypical organism Postoperatively developed fever, shortness of breath with elevated WBC count.  Also elevated procalcitonin, unclear if this is just related to postoperative atelectasis versus infection.  But given her compromised pulmonary state with underlying COPD reasonable to initiate antibiotic treatment at this time. -- Blood culture x2 -- Sputum culture -- Urine Legionella/strep pneumo  antigen -- Azithromycin -- Ceftriaxone -- Incentive spirometry/flutter valve -- Continue supplemental oxygen, maintain SPO2 greater than 88%  COPD Not in acute exacerbation.  On trilogy Ellipta outpatient. -- Breo elliptica 1 puff daily, Incruse Ellipta 1 puff daily as hospital substitution -- Albuterol/duo neb every 4 hours as needed for shortness of breath/wheezing -- Antibiotics as above -- Wean O2 as able, maintain SPO2 greater than 88% -- Incentive spirometry  Bipolar disorder PTSD -- Continue home Seroquel 100 mg p.o. every morning, 100 mg p.o. qHS -- Holding home prazosin  Hypothyroidism -- Levothyroxine 137 mcg p.o. daily  Opioid dependence --Continue home methadone     DVT prophylaxis: enoxaparin (LOVENOX) injection 40 mg Start: 01/09/22 0800 SCDs Start: 01/08/22 1812    Code Status: Full Code Family Communication: Brother present at bedside this morning  Disposition Plan:  Level of care: Med-Surg Status is: Inpatient Remains inpatient appropriate because: Per primary service, GYN  Subjective: Patient seen examined bedside, resting comfortably.  Sitting in bedside chair eating breakfast.  Oxygen is actually off of her with SPO2 91-93% at rest.  States has walked in the hallway earlier this morning and plans to walk again this afternoon.  Discussed with GYN, Dr. Jean Rosenthal this morning.  We will start antibiotics for possible pneumonia and adjusted Seroquel, discontinue prazosin as well as other narcotics other than her methadone as possible contributing factors for hypotension requiring rapid response overnight.  Patient with no specific complaints at this time.  Denies headache, no dizziness, no chest pain, no palpitations, no shortness of breath, no fever/chills/night sweats, no nausea/vomiting/diarrhea, no focal weakness, no fatigue, no paresthesias.  No acute events overnight per nursing staff.  Objective: Vitals:   01/10/22 0618 01/10/22 0704 01/10/22 1003  01/10/22 1005  BP:  (!) 84/58 107/67 118/74  Pulse:  89 89 95  Resp:  16    Temp:  98.7 F (37.1 C)    TempSrc:  Oral    SpO2: 91% 96% 94% 96%  Weight:      Height:        Intake/Output Summary (Last 24 hours) at 01/10/2022 1045 Last data filed at 01/10/2022 4270 Gross per 24 hour  Intake 1571 ml  Output 600 ml  Net 971 ml   Filed Weights   01/07/22 2305  Weight: 84.1 kg    Examination:  Physical Exam: GEN: NAD, alert and oriented x 3, wd/wn HEENT: NCAT, PERRL, EOMI, sclera clear, MMM PULM: Breath sounds slight diminished bilateral bases, coarse without crackles, normal respiratory effort, on 3 L nasal cannula with SPO2 96% at rest; but oxygen actually off of her this morning while sitting in bedside chair CV: RRR w/o M/G/R GI: abd soft, mild left lower quadrant tenderness on palpation, nondistended, NABS, no R/G/M MSK: no peripheral edema, muscle strength globally intact 5/5 bilateral upper/lower extremities NEURO: CN II-XII intact, no focal deficits, sensation to light touch intact PSYCH: normal mood/affect Integumentary: dry/intact, no rashes or wounds    Data Reviewed: I have personally reviewed following labs and imaging studies  CBC: Recent Labs  Lab 01/06/22 2235 01/07/22 1637 01/08/22 0747 01/09/22 0912 01/10/22 0515  WBC 9.9 9.4 9.3 11.2* 13.1*  NEUTROABS 5.8 5.3  --   --  10.6*  HGB 11.5* 12.2 11.6* 9.9* 9.8*  HCT 35.5* 37.2 36.0 30.3* 30.1*  MCV 82.9 81.2 82.2 80.8 81.6  PLT 300 300 285 254 232   Basic Metabolic Panel: Recent Labs  Lab 01/08/22 0747 01/09/22 0912 01/09/22 1839 01/10/22 0515 01/10/22 0749  NA 141 138 140 140 138  K 4.1 4.3 3.9 3.6 3.8  CL 108 106 103 103 101  CO2 30 27 30 28 24   GLUCOSE 106* 240* 104* 102* 103*  BUN 17 15 22* 31* 32*  CREATININE 0.98 0.91 1.20* 1.73* 1.81*  CALCIUM 9.3 8.3* 9.2 8.4* 8.3*  MG  --   --   --  1.7  --    GFR: Estimated Creatinine Clearance: 41.4 mL/min (A) (by C-G formula based on SCr  of 1.81 mg/dL (H)). Liver Function Tests: Recent Labs  Lab 01/06/22 2235 01/07/22 1637  AST 17 17  ALT 17 15  ALKPHOS 69 70  BILITOT 0.4 0.5  PROT 7.9 8.8*  ALBUMIN 3.9 4.1   No results for input(s): "LIPASE", "AMYLASE" in the last 168 hours. No results for input(s): "AMMONIA" in the last 168 hours. Coagulation Profile: No results for input(s): "INR", "PROTIME" in the last 168 hours. Cardiac Enzymes: No results for input(s): "CKTOTAL", "CKMB", "CKMBINDEX", "TROPONINI" in the last 168 hours. BNP (last 3 results) No results for input(s): "PROBNP" in the last 8760 hours. HbA1C: Recent Labs    01/07/22 1639  HGBA1C 5.8*   CBG: No results for input(s): "GLUCAP" in the last 168 hours. Lipid Profile: No results for input(s): "CHOL", "HDL", "LDLCALC", "TRIG", "CHOLHDL", "LDLDIRECT" in the last 72 hours. Thyroid Function Tests: No results for input(s): "TSH", "T4TOTAL", "FREET4", "T3FREE", "THYROIDAB" in the last 72 hours. Anemia Panel: Recent Labs    01/07/22 1638  TIBC 416  IRON 102   Sepsis Labs: Recent Labs  Lab 01/10/22 0749  PROCALCITON 8.46    Recent Results (from the past 240 hour(s))  Surgical pcr screen     Status: None   Collection Time: 01/08/22  7:46 AM  Specimen: Nasal Mucosa; Nasal Swab  Result Value Ref Range Status   MRSA, PCR NEGATIVE NEGATIVE Final   Staphylococcus aureus NEGATIVE NEGATIVE Final    Comment: (NOTE) The Xpert SA Assay (FDA approved for NASAL specimens in patients 65 years of age and older), is one component of a comprehensive surveillance program. It is not intended to diagnose infection nor to guide or monitor treatment. Performed at [facility], [address], [place], [place] [address]          Radiology Studies: DG CHEST PORT 1 VIEW  Result Date: 01/10/2022 CLINICAL DATA:  Short of breath.  History of COPD.  Follow-up exam. EXAM: PORTABLE CHEST 1 VIEW COMPARISON:  01/09/2022 and older studies.  FINDINGS: Cardiac silhouette top-normal in size. No mediastinal or hilar masses. Hazy opacity in both lung bases is similar to the previous day's study. Mild bilateral prominence of the interstitial markings. Remainder of the lungs is clear. No pneumothorax. Skeletal structures are grossly intact. IMPRESSION: 1. No significant change from the previous day's study. 2. Persistent bilateral lung base opacities consistent with a combination of atelectasis and/or pneumonia and small effusions. No convincing pulmonary edema. Electronically Signed   By: Amie Portland M.D.   On: 01/10/2022 10:29   DG Chest 2 View  Result Date: 01/09/2022 CLINICAL DATA:  Dyspnea EXAM: CHEST - 2 VIEW COMPARISON:  12/08/2021 FINDINGS: Cardiac size is within normal limits. There are patchy infiltrates in both lower lung fields with interval increase. There is blunting of both lateral CP angles. There is no pneumothorax. IMPRESSION: Increased linear patchy infiltrates are seen in both lower lung fields suggesting worsening of atelectasis/pneumonia. Small bilateral pleural effusions. Electronically Signed   By: Ernie Avena M.D.   On: 01/09/2022 11:57        Scheduled Meds:  enoxaparin (LOVENOX) injection  40 mg Subcutaneous Q24H   fluticasone furoate-vilanterol  1 puff Inhalation Daily   And   umeclidinium bromide  1 puff Inhalation Daily   levothyroxine  137 mcg Oral QAC breakfast   methadone  55 mg Oral Daily   nicotine  21 mg Transdermal Daily   polyethylene glycol  17 g Oral Daily   QUEtiapine  100 mg Oral BID   senna-docusate  1 tablet Oral BID   Continuous Infusions:  azithromycin     cefTRIAXone (ROCEPHIN)  IV     sodium chloride        LOS: 3 days    Time spent: 48 minutes spent on chart review, discussion with nursing staff, consultants, updating family and interview/physical exam; more than 50% of that time was spent in counseling and/or coordination of care.    Alvira Philips [place], DO [facility] Available via Epic secure chat 7am-7pm After these hours, please refer to coverage provider listed on [email or web address] 01/10/2022, 10:45 AM

## 2022-01-10 NOTE — Progress Notes (Signed)
   01/10/22 0050  Assess: MEWS Score  Temp (!) 100.4 F (38 C)  BP (!) 80/57  MAP (mmHg) (!) 63  Pulse Rate (!) 121  Resp 16  SpO2 91 %  O2 Device Nasal Cannula  Assess: MEWS Score  MEWS Temp 0  MEWS Systolic 2  MEWS Pulse 2  MEWS RR 0  MEWS LOC 0  MEWS Score 4  MEWS Score Color Red  Assess: if the MEWS score is Yellow or Red  Were vital signs taken at a resting state? Yes  Focused Assessment Change from prior assessment (see assessment flowsheet)  Does the patient meet 2 or more of the SIRS criteria? Yes  Does the patient have a confirmed or suspected source of infection? No  MEWS guidelines implemented *See Row Information* Yes  Treat  MEWS Interventions Escalated (See documentation below)  Pain Scale 0-10  Pain Score Asleep  Pain Type Acute pain  Pain Location Abdomen  Pain Orientation Anterior  Patients Stated Pain Goal 3  Take Vital Signs  Increase Vital Sign Frequency  Red: Q 1hr X 4 then Q 4hr X 4, if remains red, continue Q 4hrs  Escalate  MEWS: Escalate Red: discuss with charge nurse/RN and provider, consider discussing with RRT  Notify: Charge Nurse/RN  Name of Charge Nurse/RN Notified Rylie Knierim, RN  Date Charge Nurse/RN Notified 01/10/22  Time Charge Nurse/RN Notified 2355  Notify: Provider  Provider Name/Title Chinita Greenland.  Date Provider Notified 01/10/22  Time Provider Notified 0111  Method of Notification Page  Notification Reason Change in status  Notify: Rapid Response  Name of Rapid Response RN Notified Janelle, RN  Date Rapid Response Notified 01/10/22  Time Rapid Response Notified 0112  Assess: SIRS CRITERIA  SIRS Temperature  0  SIRS Pulse 1  SIRS Respirations  0  SIRS WBC 1  SIRS Score Sum  2

## 2022-01-10 NOTE — Progress Notes (Signed)
2 Days Post-Op Procedure(s) (LRB): XI ROBOTIC ASSISTED BILATERAL SALPINGO OOPHORECTOMY (N/A) Overnight events: Rapid response called for hypotension/tachycardia/low grade temp Subjective: Patient reports breathing effort improved.  No pain, N/V  Objective: Vital signs in last 24 hours: Temp:  [98 F (36.7 C)-100.4 F (38 C)] 98.7 F (37.1 C) (11/12 0704) Pulse Rate:  [85-121] 89 (11/12 0704) Resp:  [16-18] 16 (11/12 0704) BP: (80-113)/(40-94) 84/58 (11/12 0704) SpO2:  [91 %-97 %] 96 % (11/12 0704) Last BM Date : 01/09/22  Intake/Output from previous day: 11/11 0701 - 11/12 0700 In: 1571 [P.O.:820; IV Piggyback:751] Out: 600 [Urine:600]  Physical Examination: General: alert Resp: diminished breath sounds throughout and rhonchi RLL GI: soft, non-tender; bowel sounds normal; no masses,  no organomegaly; Incisions: C/D/I Extremities: Homans sign is negative, no sign of DVT  Labs: WBC/Hgb/Hct/Plts:  13.1/9.8/30.1/232 (11/12 0515) BUN/Cr/glu/ALT/AST/amyl/lip:  31/1.73/--/--/--/--/-- (11/12 0515)  DG Chest 2 View  Result Date: 01/09/2022 CLINICAL DATA:  Dyspnea EXAM: CHEST - 2 VIEW COMPARISON:  12/08/2021 FINDINGS: Cardiac size is within normal limits. There are patchy infiltrates in both lower lung fields with interval increase. There is blunting of both lateral CP angles. There is no pneumothorax. IMPRESSION: Increased linear patchy infiltrates are seen in both lower lung fields suggesting worsening of atelectasis/pneumonia. Small bilateral pleural effusions. Electronically Signed   By: Ernie Avena M.D.   On: 01/09/2022 11:57     Assessment:  56 y.o. s/p Procedure(s): XI ROBOTIC ASSISTED BILATERAL SALPINGO OOPHORECTOMY: stable Pain:  Pain is well-controlled on oral medications.  Heme: Postop anemia stable  GI:  Tolerating po: Yes     FEN: AKI--prerenal.     Pulm/ID: H/O COPD. CXR X 2 suggestive of atelectasis vw pneumonia, SIRS.  Upward trending WBC/elevated  calcitonin.  O2 sats in range on [place].  Suspect hypotension in part 2/2 diuretic, home meds.  Now on abx pneumonia  Prophylaxis: pharmacologic prophylaxis (with any of the following: enoxaparin (Lovenox) 40mg  SQ 2 hours prior to surgery then every day) and intermittent pneumatic compression boots.  Plan: Recommend fluid resuscitation;discontinue medications that aggravate hypotension Continue antibiotics Continue to optimize pulmonary toilet Encourage ambulation    LOS: 3 days    01/10/2022, 9:43 AM

## 2022-01-10 NOTE — Progress Notes (Signed)
Patient is alert and oriented X4. Patients temp 100.1,  BP 80/68 Pulse 121, resp 16.  Meghan Contreras has had oxycodone, Minipress and seroquel all in the last 2hours.  She's  1L 02 at 1L sats 91%. We'll monitor vitals q 1 hour x4 at she's MEWS red 4.

## 2022-01-11 DIAGNOSIS — K5900 Constipation, unspecified: Secondary | ICD-10-CM

## 2022-01-11 DIAGNOSIS — R1900 Intra-abdominal and pelvic swelling, mass and lump, unspecified site: Secondary | ICD-10-CM | POA: Diagnosis present

## 2022-01-11 DIAGNOSIS — E039 Hypothyroidism, unspecified: Secondary | ICD-10-CM

## 2022-01-11 LAB — CBC WITH DIFFERENTIAL/PLATELET
Abs Immature Granulocytes: 0.15 10*3/uL — ABNORMAL HIGH (ref 0.00–0.07)
Basophils Absolute: 0 10*3/uL (ref 0.0–0.1)
Basophils Relative: 0 %
Eosinophils Absolute: 0.1 10*3/uL (ref 0.0–0.5)
Eosinophils Relative: 1 %
HCT: 30.5 % — ABNORMAL LOW (ref 36.0–46.0)
Hemoglobin: 10 g/dL — ABNORMAL LOW (ref 12.0–15.0)
Immature Granulocytes: 1 %
Lymphocytes Relative: 25 %
Lymphs Abs: 3 10*3/uL (ref 0.7–4.0)
MCH: 27 pg (ref 26.0–34.0)
MCHC: 32.8 g/dL (ref 30.0–36.0)
MCV: 82.4 fL (ref 80.0–100.0)
Monocytes Absolute: 0.5 10*3/uL (ref 0.1–1.0)
Monocytes Relative: 4 %
Neutro Abs: 8.5 10*3/uL — ABNORMAL HIGH (ref 1.7–7.7)
Neutrophils Relative %: 69 %
Platelets: 241 10*3/uL (ref 150–400)
RBC: 3.7 MIL/uL — ABNORMAL LOW (ref 3.87–5.11)
RDW: 13.8 % (ref 11.5–15.5)
WBC: 12.3 10*3/uL — ABNORMAL HIGH (ref 4.0–10.5)
nRBC: 0 % (ref 0.0–0.2)

## 2022-01-11 LAB — BASIC METABOLIC PANEL
Anion gap: 7 (ref 5–15)
BUN: 22 mg/dL — ABNORMAL HIGH (ref 6–20)
CO2: 27 mmol/L (ref 22–32)
Calcium: 8.4 mg/dL — ABNORMAL LOW (ref 8.9–10.3)
Chloride: 104 mmol/L (ref 98–111)
Creatinine, Ser: 1.08 mg/dL — ABNORMAL HIGH (ref 0.44–1.00)
GFR, Estimated: >60 mL/min (ref >60)
Glucose, Bld: 100 mg/dL — ABNORMAL HIGH (ref 70–99)
Potassium: 3.4 mmol/L — ABNORMAL LOW (ref 3.5–5.1)
Sodium: 138 mmol/L (ref 135–145)

## 2022-01-11 LAB — PROCALCITONIN: Procalcitonin: 4.99 ng/mL

## 2022-01-11 MED ORDER — POTASSIUM CHLORIDE CRYS ER 20 MEQ PO TBCR
40.0000 meq | EXTENDED_RELEASE_TABLET | Freq: Once | ORAL | Status: AC
  Administered 2022-01-11: 40 meq via ORAL
  Filled 2022-01-11: qty 2

## 2022-01-11 MED ORDER — AZITHROMYCIN 500 MG PO TABS: 500.0000 mg | ORAL_TABLET | Freq: Every day | ORAL | 0 refills | Status: AC

## 2022-01-11 MED ORDER — SENNOSIDES-DOCUSATE SODIUM 8.6-50 MG PO TABS: 2.0000 | ORAL_TABLET | Freq: Every day | ORAL | 1 refills | Status: DC

## 2022-01-11 MED ORDER — GABAPENTIN 100 MG PO CAPS: 100.0000 mg | ORAL_CAPSULE | Freq: Three times a day (TID) | ORAL | 2 refills | Status: DC

## 2022-01-11 MED ORDER — CEFDINIR 300 MG PO CAPS: 300.0000 mg | ORAL_CAPSULE | Freq: Two times a day (BID) | ORAL | 0 refills | Status: AC

## 2022-01-11 NOTE — Discharge Summary (Signed)
Physician Discharge Summary  Patient ID: Meghan Contreras MRN: [id number] DOB/AGE: 04/24/1965 56 y.o.  Admit date: 01/06/2022 Discharge date: 01/11/2022  Admission Diagnoses: Adnexal mass  Discharge Diagnoses:  Principal Problem:   Adnexal mass Active Problems:   COPD (chronic obstructive pulmonary disease) (HCC)   Chronic hypoxic respiratory failure (HCC)   AKI (acute kidney injury) (HCC)   Bipolar disorder (HCC)   PTSD (post-traumatic stress disorder)   Normocytic anemia   Hyperglycemia   Opioid dependence (HCC)   Hypothyroidism   Ovarian mass   Left lower quadrant abdominal pain   Anxiety state   Smoker   Umbilical hernia without obstruction or gangrene   Pelvic mass   Discharged Condition:  The patient is in good condition and stable for discharge.   Hospital Course: Meghan Contreras is a 56 year old female who presented to the ER on 01/06/2022 with moderate abdominal pain with findings of a large pelvic mass on CT imaging as well as a possible hernia. There was concern for ovarian torsion given her symptoms. On 01/08/2022, she underwent robotic-assisted bilateral salpingo-oophorectomy. Operative findings included: On EUA, enlarged mass appreciated in the cul-de-sac, mobile.  On intra-abdominal entry, normal upper abdominal survey.  Normal small bowel.  Adhesions of the sigmoid to the infundibulopelvic ligament.  Right tube and ovary normal in appearance.  Left ovary enlarged with a 10-12 cm smooth mass, necrotic in appearance. Fallopian tubes significantly dilated, edematous, and necrotic appearing.  Ovary torsed on its blood supply x2.  Uterus approximately 8 cm, normal in appearance.  No ascites.  Frozen section consistent with necrotic ovary, stromal tumor, without definite evidence of malignancy. Small, periumbilical hernia noted although not completely circumferential.  Given its small size, shallowness, and that it was not circumferential, decision made not to repair  this.  Given her chronic co-morbidities including COPD, she was observed in the hospital. A chest x-ray was on POD 1 due to reports of "shallow breathing" with some vascular congestion/atelectasis, low suspicion for infectious etiology.  IV fluids were discontinued at that time and the patient was given a dose of IV Lasix per the Hospitalist Team. She developed a temperature of 100.1-100.4 on POD 2, pulse 121, BP 80/68, O2 91% on 1 L. Given her upward trending WBC (13.1 on 01/10/2022) and elevated calcitonin, she was started on antibiotics for pneumonia. Blood cultures were obtained on 01/10/22 with preliminary results showing no growth <24 hours. D dimer 3.56. The patient's symptoms improved and she was discharged home on POD 3 tolerating her diet, ambulating, voiding, pain managed, passing flatus with oral antibiotics. Per hospitalist team, no home oxygen needed.  Consults: Hospitalists, Respiratory  Significant Diagnostic Studies: Labs, CT AP on 01/07/22, Chest xray 01/09/2022 and 01/10/2022, Blood cultures     Latest Ref Rng & Units 01/11/2022    4:37 AM 01/10/2022    5:15 AM 01/09/2022    9:12 AM  CBC  WBC 4.0 - 10.5 K/uL 12.3  13.1  11.2   Hemoglobin 12.0 - 15.0 g/dL 19.6  9.8  9.9   Hematocrit 36.0 - 46.0 % 30.5  30.1  30.3   Platelets 150 - 400 K/uL 241  232  254        Latest Ref Rng & Units 01/11/2022    4:37 AM 01/10/2022    6:39 PM 01/10/2022    7:49 AM  BMP  Glucose 70 - 99 mg/dL 222  979  892   BUN 6 - 20 mg/dL 22  29  32  Creatinine 0.44 - 1.00 mg/dL 7.40  8.14  4.81   Sodium 135 - 145 mmol/L 138  138  138   Potassium 3.5 - 5.1 mmol/L 3.4  3.6  3.8   Chloride 98 - 111 mmol/L 104  101  101   CO2 22 - 32 mmol/L 27  26  24    Calcium 8.9 - 10.3 mg/dL 8.4  8.6  8.3      Treatments: Surgery, IV antibiotics, breathing treatments with Respiratory  Discharge Exam (performed by Dr. ): Blood pressure 118/82, pulse 86, temperature 98.6 F (37 C), temperature  source Oral, resp. rate 16, height 5\' 10"  (1.778 m), weight 181 lb 11.2 oz (82.4 kg), SpO2 93 %. Alert, oriented, in no acute distress, resting on the side of the bed with family at the bedside Heart regular in rate and rhythm Lungs diminished in the bases, improved from previous assessment Abdomen soft, incisions intact with dermabond, active bowel sounds No lower extrem edema noted bilaterally  Disposition: Discharge disposition: 01-Home or Self Care       Discharge Instructions     Call MD for:  difficulty breathing, headache or visual disturbances   Complete by: As directed    Call MD for:  extreme fatigue   Complete by: As directed    Call MD for:  hives   Complete by: As directed    Call MD for:  persistant dizziness or light-headedness   Complete by: As directed    Call MD for:  persistant nausea and vomiting   Complete by: As directed    Call MD for:  redness, tenderness, or signs of infection (pain, swelling, redness, odor or green/yellow discharge around incision site)   Complete by: As directed    Call MD for:  severe uncontrolled pain   Complete by: As directed    Call MD for:  temperature >100.4   Complete by: As directed    Diet - low sodium heart healthy   Complete by: As directed    Driving Restrictions   Complete by: As directed    No driving for around 1 week(s) from surgery.  Do not take narcotics and drive. You need to make sure your reaction time has returned.   Increase activity slowly   Complete by: As directed    Lifting restrictions   Complete by: As directed    No lifting greater than 10 lbs, pushing, pulling, straining for 6 weeks.   Sexual Activity Restrictions   Complete by: As directed    No sexual activity, nothing in the vagina, for 4 weeks.      Allergies as of 01/11/2022       Reactions   Amoxicillin Swelling, Rash        Medication List     STOP taking these medications    Buprenorphine HCl-Naloxone HCl 8-2 MG Film    nicotine 21 mg/24hr patch Commonly known as: NICODERM CQ - dosed in mg/24 hours       TAKE these medications    albuterol 108 (90 Base) MCG/ACT inhaler Commonly known as: VENTOLIN HFA Inhale 2 puffs into the lungs as needed for wheezing or shortness of breath.   atorvastatin 40 MG tablet Commonly known as: LIPITOR Take 40 mg by mouth daily.   azithromycin 500 MG tablet Commonly known as: Zithromax Take 1 tablet (500 mg total) by mouth daily for 4 days.   cefdinir 300 MG capsule Commonly known as: OMNICEF Take 1 capsule (300 mg total)  by mouth 2 (two) times daily for 6 days.   gabapentin 100 MG capsule Commonly known as: Neurontin Take 1 capsule (100 mg total) by mouth 3 (three) times daily.   hydrOXYzine 25 MG capsule Commonly known as: VISTARIL Take 25 mg by mouth 3 (three) times daily as needed for anxiety.   ibuprofen 200 MG tablet Commonly known as: ADVIL Take 400-600 mg by mouth 2 (two) times daily as needed for moderate pain.   levothyroxine 137 MCG tablet Commonly known as: SYNTHROID Take 137 mcg by mouth daily before breakfast.   methadone 10 MG/ML solution Commonly known as: DOLOPHINE Take 54 mg by mouth daily. Verified with RN Mariea Clonts 54 mg   prazosin 2 MG capsule Commonly known as: MINIPRESS Take 4 mg by mouth at bedtime.   QUEtiapine 100 MG tablet Commonly known as: SEROQUEL Take 100 mg by mouth 2 (two) times daily.   senna-docusate 8.6-50 MG tablet Commonly known as: Senokot-S Take 2 tablets by mouth at bedtime. Do not take if having loose stools   Trelegy Ellipta 100-62.5-25 MCG/ACT Aepb Generic drug: Fluticasone-Umeclidin-Vilant Inhale 1 puff into the lungs daily.        Follow-up Information     Carver Fila, MD Follow up on 01/15/2022.   Specialty: Gynecologic Oncology Why: at 4:30pm will be a PHONE visit where Dr. Pricilla Holm will call you and discuss final pathology. IN PERSON visit will be on 02/02/2022 at 3pm at the  [facility] to see Dr. Pricilla Holm for a post-op check. Contact information: [address] [place] [address] [phone]                 Greater than thirty minutes were spend for face to face discharge instructions and discharge orders/summary in EPIC.   Signed: Doylene Bode 01/11/2022, 12:03 PM

## 2022-01-11 NOTE — Progress Notes (Signed)
PROGRESS NOTE    Meghan Contreras  XBW:[id number] DOB: 21-Dec-1965 DOA: 01/06/2022 PCP: Julieanne Manson, MD    Brief Narrative:   Meghan Contreras is a 56 y.o. female with past medical history significant for COPD, bipolar disorder, PTSD, hypothyroidism, opioid dependence who presented to [facility] ED on 11/9 with complaints of abdominal pain.  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  Patient was admitted to the GYN/Oncology service.  Hospitalist service consulted for assistance with management of her chronic comorbidities.  Assessment & Plan:   Bilateral ovarian torsion with necrosis s/p robotic assisted bilateral salpingo oophorectomy Patient presenting to ED with progressive left lower quadrant abdominal pain.  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  CEA elevated at 8.7, CA125 12.8, within normal limits.  Underwent robotic assisted bilateral salpingo-oophorectomy by GYN/Onc Dr. Pricilla Holm on 01/09/2022 --Pathology: Pending --Further per GYN/Onc  Hypotension: Resolved Overnight, patient's blood pressure noted to be 81/40.  Likely multifactorial with IV Lasix and prazosin; as well as pain medication and high-dose Seroquel.  Pneumonia, suspect gram-negative versus atypical organism Postoperatively developed fever, shortness of breath with elevated WBC count.  Also elevated procalcitonin, unclear if this is just related to postoperative atelectasis versus infection.  But given her compromised pulmonary state with underlying COPD reasonable to initiate antibiotic treatment at this time.  Patient was started on azithromycin and ceftriaxone and will discharge on azithromycin and cefdinir to complete antibiotic course.    Antibiotics sent to patient's pharmacy.  COPD Not in acute exacerbation.  On trilogy Ellipta  outpatient.  Bipolar disorder PTSD Continue home Seroquel 100 mg p.o. every morning, 100 mg p.o. qHS  Hypothyroidism Levothyroxine 137 mcg p.o. daily  Opioid dependence Continue home methadone   Patient discharging home today per primary service.  Antibiotics have been sent to her preferred pharmacy.  Hospitalist medicine service will sign off.  Appreciate the opportunity to participate in the care of this patient.  Feel free to call us back for any questions or concerns.   DVT prophylaxis: enoxaparin (LOVENOX) injection 40 mg Start: 01/09/22 0800 SCDs Start: 01/08/22 1812    Code Status: Full Code Family Communication: Brother present at bedside this morning  Disposition Plan:  Level of care: Med-Surg Status is: Inpatient Remains inpatient appropriate because: Per primary service, GYN; with plans for discharge today  Subjective: Patient seen examined bedside, resting comfortably.  Lying in bed but seen walking the halls earlier this morning.  Remains off of oxygen.  Ready for discharge.  No other specific complaints or concerns at this time.  Discussed with GYN, Dr. Pricilla Holm this morning.  Antibiotics sent to pharmacy.   Objective: Vitals:   01/11/22 0501 01/11/22 0828 01/11/22 0900 01/11/22 0903  BP:  118/82    Pulse:  86    Resp:      Temp:      TempSrc:      SpO2:  92% (!) 89% 93%  Weight: 82.4 kg     Height:        Intake/Output Summary (Last 24 hours) at 01/11/2022 1030 Last data filed at 01/11/2022 0855 Gross per 24 hour  Intake 770 ml  Output --  Net 770 ml   Filed Weights   01/07/22 2305 01/11/22 0501  Weight: 84.1 kg 82.4 kg    Examination:  Physical Exam: GEN: NAD, alert and oriented  x 3, wd/wn HEENT: NCAT, PERRL, EOMI, sclera clear, MMM PULM: Breath sounds slight diminished bilateral bases, no crackles/wheezing, normal [place] effort without accessory muscle use, on room air CV: RRR w/o M/G/R GI: abd soft, NTTP, nondistended, NABS, no  R/G/M MSK: no peripheral edema, muscle strength globally intact 5/5 bilateral upper/lower extremities NEURO: CN II-XII intact, no focal deficits, sensation to light touch intact PSYCH: normal mood/affect Integumentary: dry/intact, no rashes or wounds    Data Reviewed: I have personally reviewed following labs and imaging studies  CBC: Recent Labs  Lab 01/06/22 2235 01/07/22 1637 01/08/22 0747 01/09/22 0912 01/10/22 0515 01/11/22 0437  WBC 9.9 9.4 9.3 11.2* 13.1* 12.3*  NEUTROABS 5.8 5.3  --   --  10.6* 8.5*  HGB 11.5* 12.2 11.6* 9.9* 9.8* 10.0*  HCT 35.5* 37.2 36.0 30.3* 30.1* 30.5*  MCV 82.9 81.2 82.2 80.8 81.6 82.4  PLT 300 300 285 254 232 241   Basic Metabolic Panel: Recent Labs  Lab 01/09/22 1839 01/10/22 0515 01/10/22 0749 01/10/22 1839 01/11/22 0437  NA 140 140 138 138 138  K 3.9 3.6 3.8 3.6 3.4*  CL 103 103 101 101 104  CO2 30 28 24 26 27   GLUCOSE 104* 102* 103* 130* 100*  BUN 22* 31* 32* 29* 22*  CREATININE 1.20* 1.73* 1.81* 1.36* 1.08*  CALCIUM 9.2 8.4* 8.3* 8.6* 8.4*  MG  --  1.7  --   --   --    GFR: Estimated Creatinine Clearance: 68.8 mL/min (A) (by C-G formula based on SCr of 1.08 mg/dL (H)). Liver Function Tests: Recent Labs  Lab 01/06/22 2235 01/07/22 1637  AST 17 17  ALT 17 15  ALKPHOS 69 70  BILITOT 0.4 0.5  PROT 7.9 8.8*  ALBUMIN 3.9 4.1   No results for input(s): "LIPASE", "AMYLASE" in the last 168 hours. No results for input(s): "AMMONIA" in the last 168 hours. Coagulation Profile: No results for input(s): "INR", "PROTIME" in the last 168 hours. Cardiac Enzymes: No results for input(s): "CKTOTAL", "CKMB", "CKMBINDEX", "TROPONINI" in the last 168 hours. BNP (last 3 results) No results for input(s): "PROBNP" in the last 8760 hours. HbA1C: No results for input(s): "HGBA1C" in the last 72 hours.  CBG: No results for input(s): "GLUCAP" in the last 168 hours. Lipid Profile: No results for input(s): "CHOL", "HDL", "LDLCALC",  "TRIG", "CHOLHDL", "LDLDIRECT" in the last 72 hours. Thyroid Function Tests: No results for input(s): "TSH", "T4TOTAL", "FREET4", "T3FREE", "THYROIDAB" in the last 72 hours. Anemia Panel: No results for input(s): "VITAMINB12", "FOLATE", "FERRITIN", "TIBC", "IRON", "RETICCTPCT" in the last 72 hours.  Sepsis Labs: Recent Labs  Lab 01/10/22 0749 01/11/22 0437  PROCALCITON 8.46 4.99    Recent Results (from the past 240 hour(s))  Surgical pcr screen     Status: None   Collection Time: 01/08/22  7:46 AM   Specimen: Nasal Mucosa; Nasal Swab  Result Value Ref Range Status   MRSA, PCR NEGATIVE NEGATIVE Final   Staphylococcus aureus NEGATIVE NEGATIVE Final    Comment: (NOTE) The Xpert SA Assay (FDA approved for NASAL specimens in patients 55 years of age and older), is one component of a comprehensive surveillance program. It is not intended to diagnose infection nor to guide or monitor treatment. Performed at [facility], [address] [place], [place] [id number]   Culture, blood (Routine X 2) w Reflex to ID Panel     Status: None (Preliminary result)   Collection Time: 01/10/22  7:49 AM   Specimen:  BLOOD  Result Value Ref Range Status   Specimen Description BLOOD BLOOD RIGHT ARM  Final   Special Requests   Final    Blood Culture adequate volume BOTTLES DRAWN AEROBIC AND ANAEROBIC   Culture   Final    NO GROWTH < 24 HOURS Performed at [facility], [address] [place], [place] [address]    Report Status PENDING  Incomplete  Culture, blood (Routine X 2) w Reflex to ID Panel     Status: None (Preliminary result)   Collection Time: 01/10/22  7:49 AM   Specimen: BLOOD LEFT ARM  Result Value Ref Range Status   Specimen Description   Final    BLOOD LEFT ARM AEROBIC BOTTLE ONLY Performed at [facility], [address] [place], [place] [address]    Special Requests   Final    Blood Culture adequate volume Performed at [facility], [address], [place], [place] [address]    Culture   Final    NO GROWTH < 24 HOURS Performed at [facility], [address], [place], [place] [address]    Report Status PENDING  Incomplete         Radiology Studies: DG CHEST PORT 1 VIEW  Result Date: 01/10/2022 CLINICAL DATA:  Short of breath.  History of COPD.  Follow-up exam. EXAM: PORTABLE CHEST 1 VIEW COMPARISON:  01/09/2022 and older studies. FINDINGS: Cardiac silhouette top-normal in size. No mediastinal or hilar masses. Hazy opacity in both lung bases is similar to the previous day's study. Mild bilateral prominence of the interstitial markings. Remainder of the lungs is clear. No pneumothorax. Skeletal structures are grossly intact. IMPRESSION: 1. No significant change from the previous day's study. 2. Persistent bilateral lung base opacities consistent with a combination of atelectasis and/or pneumonia and small effusions. No convincing pulmonary edema. Electronically Signed   By: Amie Portland M.D.   On: 01/10/2022 10:29   DG Chest 2 View  Result Date: 01/09/2022 CLINICAL DATA:  Dyspnea EXAM: CHEST - 2 VIEW COMPARISON:  12/08/2021 FINDINGS: Cardiac size is within normal limits. There are patchy infiltrates in both lower lung fields with interval increase. There is blunting of both lateral CP angles. There is no pneumothorax. IMPRESSION: Increased linear patchy infiltrates are seen in both lower lung fields suggesting worsening of atelectasis/pneumonia. Small bilateral pleural effusions. Electronically Signed   By: Ernie Avena M.D.   On: 01/09/2022 11:57        Scheduled Meds:  enoxaparin (LOVENOX) injection  40 mg Subcutaneous Q24H   fluticasone furoate-vilanterol  1 puff Inhalation Daily   And   umeclidinium bromide  1 puff Inhalation Daily   levothyroxine  137 mcg Oral QAC breakfast   methadone  55 mg Oral Daily   nicotine  21 mg Transdermal Daily   polyethylene glycol  17 g Oral Daily    QUEtiapine  100 mg Oral BID   senna-docusate  1 tablet Oral BID   Continuous Infusions:  azithromycin 500 mg (01/11/22 0928)   cefTRIAXone (ROCEPHIN)  IV 2 g (01/10/22 1056)      LOS: 4 days    Time spent: 48 minutes spent on chart review, discussion with nursing staff, consultants, updating family and interview/physical exam; more than 50% of that time was spent in counseling and/or coordination of care.    Alvira Philips [place], DO [facility] Available via Epic secure chat 7am-7pm After these hours, please refer to coverage provider listed  on [email or web address] 01/11/2022, 10:30 AM

## 2022-01-12 ENCOUNTER — Encounter (HOSPITAL_COMMUNITY): Payer: Self-pay | Admitting: Psychiatry

## 2022-01-12 ENCOUNTER — Telehealth: Payer: Self-pay | Admitting: Gynecologic Oncology

## 2022-01-12 LAB — SURGICAL PATHOLOGY

## 2022-01-12 LAB — CYTOLOGY - PAP: Diagnosis: NEGATIVE

## 2022-01-12 LAB — CYTOLOGY - NON PAP

## 2022-01-12 NOTE — Telephone Encounter (Signed)
Attempted to call the patient at multiple numbers listed.  No answer.  Jeral Pinch MD Gynecologic Oncology

## 2022-01-13 ENCOUNTER — Telehealth: Payer: Self-pay | Admitting: Gynecologic Oncology

## 2022-01-13 ENCOUNTER — Ambulatory Visit (HOSPITAL_COMMUNITY): Payer: No Payment, Other | Admitting: Licensed Clinical Social Worker

## 2022-01-13 NOTE — Telephone Encounter (Signed)
Called patient.  She is doing well after discharge.  Reviewed pathology from surgery.  She is thrilled with this news.  Also discussed negative Pap test.  She is aware I will have the office cancel her telephone visit this Friday but that I will see her in person on December 5.  Jeral Pinch MD Gynecologic Oncology

## 2022-01-15 ENCOUNTER — Inpatient Hospital Stay: Payer: Self-pay | Admitting: Gynecologic Oncology

## 2022-01-15 LAB — CULTURE, BLOOD (ROUTINE X 2)
Culture: NO GROWTH
Culture: NO GROWTH
Special Requests: ADEQUATE
Special Requests: ADEQUATE

## 2022-01-18 ENCOUNTER — Other Ambulatory Visit: Payer: Self-pay | Admitting: Internal Medicine

## 2022-02-02 ENCOUNTER — Inpatient Hospital Stay: Payer: Self-pay | Attending: Gynecologic Oncology | Admitting: Gynecologic Oncology

## 2022-02-02 ENCOUNTER — Other Ambulatory Visit: Payer: Self-pay

## 2022-02-02 ENCOUNTER — Encounter: Payer: Self-pay | Admitting: Gynecologic Oncology

## 2022-02-02 VITALS — BP 130/96 | HR 82 | Temp 98.4°F | Resp 19 | Wt 187.1 lb

## 2022-02-02 DIAGNOSIS — N83519 Torsion of ovary and ovarian pedicle, unspecified side: Secondary | ICD-10-CM

## 2022-02-02 DIAGNOSIS — Z90722 Acquired absence of ovaries, bilateral: Secondary | ICD-10-CM

## 2022-02-02 DIAGNOSIS — Z8041 Family history of malignant neoplasm of ovary: Secondary | ICD-10-CM

## 2022-02-02 DIAGNOSIS — R1900 Intra-abdominal and pelvic swelling, mass and lump, unspecified site: Secondary | ICD-10-CM

## 2022-02-02 NOTE — Patient Instructions (Signed)
It was good to see you today.  You are healing well from surgery.  Remember, no heavy lifting for at least 6 weeks after surgery.  Please make sure to follow-up with your primary care provider.  You still have a uterus and cervix.  I did a Pap test at the time of your surgery which was normal or negative.  You do not need another Pap smear for 3 years.

## 2022-02-02 NOTE — Progress Notes (Signed)
Gynecologic Oncology Return Clinic Visit  02/02/22  Reason for Visit: follow-up after surgery  Treatment History: 11/10: Robotic-assisted bilateral salpingo-oophorectomy   Interval History: Doing well.  Denies any significant abdominal or pelvic pain until 2 days ago when she was at the store, developed some right side pain.  Endorses some continued constipation, using Senokot.  Denies any urinary symptoms.  Past Medical/Surgical History: Past Medical History:  Diagnosis Date   Anxiety    Bipolar disorder ([place])    COPD (chronic obstructive pulmonary disease) ([place])    Depression    HOH (hard of hearing)    Hyperlipidemia 2021   Hyperthyroidism    Opiate addiction ([place])    On chronic suboxone since 2017 this last go around.     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  Old records from [place] with CT of soft tissue of chest and neck 11/2017:  No findings to suggest cause of stridorous breathing and no obvious lung disease.      Past Surgical History:  Procedure Laterality Date   ROBOTIC ASSISTED BILATERAL SALPINGO OOPHERECTOMY N/A 01/08/2022   Procedure: XI ROBOTIC ASSISTED BILATERAL SALPINGO OOPHORECTOMY;  Surgeon: Lafonda Mosses, MD;  Location: [facility];  Service: Gynecology;  Laterality: N/A;   THYROIDECTOMY  2019   THYROIDECTOMY      Family History  Problem Relation Age of Onset   HIV/AIDS Mother        from drug abuse   Drug abuse Mother    Drug abuse Brother    Hepatitis C Brother        From IV drug abuse   Drug abuse Sister    Drug abuse Sister    Drug abuse Sister    Drug abuse Sister    Drug abuse Sister    Cancer Sister        lung   Drug abuse Sister     Social History   Socioeconomic History   Marital status: Legally Separated    Spouse name: Not on file   Number of children: 2   Years of education: Not on file   Highest education level: Some college, no degree  Occupational  History   Occupation: unemployed    Comment: Supported by daughter, ex husband and boyfriend.  Tobacco Use   Smoking status: Every Day    Packs/day: 1.00    Years: 42.00    Total pack years: 42.00    Types: Cigarettes   Smokeless tobacco: Never  Vaping Use   Vaping Use: Not on file  Substance and Sexual Activity   Alcohol use: No   Drug use: Not Currently    Comment: Benzodiazepines / Klonopin/opiate addiction.  Also history of crack cocaine abuse.  2021 clean for 2017 on Suboxone.    Sexual activity: Not Currently  Other Topics Concern   Not on file  Social History Narrative   ** Merged History Encounter **       Lives alone, though her boyfriend will visit for 2 months at a time.   Social Determinants of Health   Financial Resource Strain: Not on file  Food Insecurity: No Food Insecurity (01/08/2022)   Hunger Vital Sign    Worried About Running Out of Food in the Last Year: Never true    Ran Out of Food in the Last Year: Never true  Transportation Needs: No Transportation Needs (01/08/2022)   PRAPARE - Transportation    Lack of  Transportation (Medical): No    Lack of Transportation (Non-Medical): No  Physical Activity: Not on file  Stress: Not on file  Social Connections: Not on file    Current Medications:  Current Outpatient Medications:    albuterol (VENTOLIN HFA) 108 (90 Base) MCG/ACT inhaler, Inhale 2 puffs into the lungs every 6 (six) hours as needed for wheezing or shortness of breath., Disp: 8 g, Rfl: 0   albuterol (VENTOLIN HFA) 108 (90 Base) MCG/ACT inhaler, Inhale 2 puffs into the lungs as needed for wheezing or shortness of breath., Disp: , Rfl:    atorvastatin (LIPITOR) 40 MG tablet, TAKE 1 TABLET BY MOUTH DAILY WITH THE EVENING MEAL, Disp: 30 tablet, Rfl: 0   buprenorphine-naloxone (SUBOXONE) 8-2 mg SUBL SL tablet, Place 1 tablet under the tongue 2 (two) times daily., Disp: , Rfl:    Fluticasone-Umeclidin-Vilant (TRELEGY ELLIPTA) 100-62.5-25 MCG/ACT  AEPB, Inhale 1 puff into the lungs daily., Disp: 1 each, Rfl: 11   Fluticasone-Umeclidin-Vilant (TRELEGY ELLIPTA) 100-62.5-25 MCG/ACT AEPB, Inhale 1 puff into the lungs daily., Disp: , Rfl:    gabapentin (NEURONTIN) 100 MG capsule, Take 1 capsule (100 mg total) by mouth 3 (three) times daily., Disp: 90 capsule, Rfl: 2   hydrOXYzine (VISTARIL) 25 MG capsule, Take 1 capsule (25 mg total) by mouth 3 (three) times daily as needed., Disp: 90 capsule, Rfl: 3   hydrOXYzine (VISTARIL) 25 MG capsule, Take 25 mg by mouth 3 (three) times daily as needed for anxiety., Disp: , Rfl:    ibuprofen (ADVIL) 200 MG tablet, Take 400-600 mg by mouth 2 (two) times daily as needed for moderate pain., Disp: , Rfl:    levothyroxine (SYNTHROID) 137 MCG tablet, Take 1 tablet (137 mcg total) by mouth daily before breakfast., Disp: 30 tablet, Rfl: 10   levothyroxine (SYNTHROID) 137 MCG tablet, Take 137 mcg by mouth daily before breakfast., Disp: , Rfl:    methadone (DOLOPHINE) 10 MG/ML solution, Take 54 mg by mouth daily. Verified with RN Minna Antis 54 mg, Disp: , Rfl:    nicotine (NICODERM CQ) 21 mg/24hr patch, Place 1 patch (21 mg total) onto the skin daily., Disp: 30 patch, Rfl: 3   prazosin (MINIPRESS) 2 MG capsule, 2 tabs by mouth at bedtime nightly, Disp: 60 capsule, Rfl: 3   prazosin (MINIPRESS) 2 MG capsule, Take 4 mg by mouth at bedtime., Disp: , Rfl:    QUEtiapine (SEROQUEL) 100 MG tablet, Take 100 mg by mouth 2 (two) times daily., Disp: , Rfl:    QUEtiapine (SEROQUEL) 300 MG tablet, Take 1 tablet (300 mg total) by mouth at bedtime., Disp: 30 tablet, Rfl: 3   QUEtiapine 150 MG TABS, Take 150 mg by mouth every morning., Disp: 30 tablet, Rfl: 3   senna-docusate (SENOKOT-S) 8.6-50 MG tablet, Take 2 tablets by mouth at bedtime. Do not take if having loose stools, Disp: 60 tablet, Rfl: 1  Review of Systems: + shortness of breath, wheezing. Denies appetite changes, fevers, chills, fatigue, unexplained weight  changes. Denies hearing loss, neck lumps or masses, mouth sores, ringing in ears or voice changes. Denies cough. Denies chest pain or palpitations. Denies leg swelling. Denies abdominal distention, pain, blood in stools, constipation, diarrhea, nausea, vomiting, or early satiety. Denies pain with intercourse, dysuria, frequency, hematuria or incontinence. Denies hot flashes, pelvic pain, vaginal bleeding or vaginal discharge.   Denies joint pain, back pain or muscle pain/cramps. Denies itching, rash, or wounds. Denies dizziness, headaches, numbness or seizures. Denies swollen lymph nodes or glands, denies  easy bruising or bleeding. Denies anxiety, depression, confusion, or decreased concentration.  Physical Exam: BP (!) 130/96 (BP Location: Left Arm, Patient Position: Sitting)   Pulse 82   Temp 98.4 F (36.9 C) (Oral)   Resp 19   Wt 187 lb 2 oz (84.9 kg)   SpO2 95%   BMI 26.85 kg/m  General: Alert, oriented, no acute distress. HEENT: Normocephalic, atraumatic, sclera anicteric. Chest: Labored breathing on room air, audible wheezing, at baseline. Abdomen: soft, nontender.  Normoactive bowel sounds.  No masses or hepatosplenomegaly appreciated.  Well-healed incisions.  Remaining Dermabond removed and multiple mattress sutures excised. Extremities: Grossly normal range of motion.  Warm, well perfused.  No edema bilaterally.  Laboratory & Radiologic Studies: A. OVARY AND FALLOPIAN TUBE, LEFT, SALPINGO OOPHORECTOMY:  Benign ovarian fibroma.  Benign hemorrhagic ovarian tissue.  Negative for malignancy.  See comment.   B. OVARY AND FALLOPIAN TUBE, RIGHT, SALPINGO OOPHORECTOMY:  Unremarkable ovary.  Benign fallopian tube with benign paratubal cyst.  Negative for malignancy.   C. OVARY AND FALLOPIAN TUBE, LEFT, ADDITIONAL, SALPINGO OOPHORECTOMY:  Benign ovarian fibroma.  Negative for malignancy.  See comment.   Cytology shows reactive mesothelial cells, acute  inflammation.  Assessment & Plan: Meghan Contreras is a 56 y.o. woman s/p robotic BSO in the setting of a torsed, necrotic ovarian mass, benign final pathology.  Given postmenopausal status mother's history of ovarian cancer, BSO offered.  Patient is overall doing well from a postoperative standpoint.  Need expectations reviewed.  Discussed continuing on bowel regimen until bowels are back to normal.  Reviewed pathology from surgery as well as findings.  Patient was given a copy of her pathology report as well as several pictures I took.  Discussed that Pap test was performed at the time of surgery and negative.  She will be due for Pap test or cotesting in 3 years.  She will follow-up with her PCP for this.  18 minutes of total time was spent for this patient encounter, including preparation, face-to-face counseling with the patient and coordination of care, and documentation of the encounter.  Jeral Pinch, MD  Division of Gynecologic Oncology  Department of Obstetrics and Gynecology  [facility]

## 2022-02-10 ENCOUNTER — Encounter (HOSPITAL_COMMUNITY): Payer: Self-pay | Admitting: Psychiatry

## 2022-02-10 ENCOUNTER — Telehealth (INDEPENDENT_AMBULATORY_CARE_PROVIDER_SITE_OTHER): Payer: No Payment, Other | Admitting: Psychiatry

## 2022-02-10 ENCOUNTER — Other Ambulatory Visit (HOSPITAL_COMMUNITY): Payer: Self-pay | Admitting: Psychiatry

## 2022-02-10 DIAGNOSIS — F3170 Bipolar disorder, currently in remission, most recent episode unspecified: Secondary | ICD-10-CM | POA: Diagnosis not present

## 2022-02-10 DIAGNOSIS — F411 Generalized anxiety disorder: Secondary | ICD-10-CM | POA: Diagnosis not present

## 2022-02-10 DIAGNOSIS — F17200 Nicotine dependence, unspecified, uncomplicated: Secondary | ICD-10-CM | POA: Diagnosis not present

## 2022-02-10 MED ORDER — NICOTINE 21 MG/24HR TD PT24: 21.0000 mg | MEDICATED_PATCH | Freq: Every day | TRANSDERMAL | 3 refills | Status: DC

## 2022-02-10 MED ORDER — QUETIAPINE FUMARATE 150 MG PO TABS: 150.0000 mg | ORAL_TABLET | Freq: Every morning | ORAL | 3 refills | Status: DC

## 2022-02-10 MED ORDER — GABAPENTIN 100 MG PO CAPS: 100.0000 mg | ORAL_CAPSULE | Freq: Three times a day (TID) | ORAL | 2 refills | Status: DC

## 2022-02-10 MED ORDER — QUETIAPINE FUMARATE 300 MG PO TABS: 300.0000 mg | ORAL_TABLET | Freq: Every day | ORAL | 3 refills | Status: DC

## 2022-02-10 MED ORDER — PRAZOSIN HCL 2 MG PO CAPS: 4.0000 mg | ORAL_CAPSULE | Freq: Every day | ORAL | 3 refills | Status: DC

## 2022-02-10 NOTE — Progress Notes (Signed)
[place] MD/PA/NP OP Progress Note Virtual Visit via Telephone Note  I connected with Meghan Contreras on 02/10/22 at  1:00 PM EST by telephone and verified that I am speaking with the correct person using two identifiers.  Location: Patient: home Provider: Clinic   I discussed the limitations, risks, security and privacy concerns of performing an evaluation and management service by telephone and the availability of in person appointments. I also discussed with the patient that there may be a patient responsible charge related to this service. The patient expressed understanding and agreed to proceed.   I provided 30 minutes of non-face-to-face time during this encounter.   02/10/2022 12:16 PM Meghan Contreras  MRN:  [id number]  Chief Complaint: "I stopped hydroxyzine and been taking gabapentin"  HPI: 56 year old female seen today for follow up psychiatric evaluation.  She has a psychiatric history of depression, PTSD, bipolar affective disorder, tobacco dependence, and opioid addiction (in remission four years).  Patient is currently prescribed Seroquel 300 mg nightly, Seroquel 150 mg daily, and prazosin 4 mg nightly by her PCP. She notes her medications are effective in managing her psychiatric conditions.   Today she was unable to login virtually so her assessment was done over the phone. During exam she pleasant, cooperative, and engaged in conversation.  She informed Probation officer that she discontinued hydroxyzine as she found it ineffective.  She notes that she is was given gabapentin in the hospital after having a mass removed from her ovaries.  She notes that gabapentin has been effective in managing her anxiety and her pain.  Overall patient notes that her mood, anxiety, and depression are well-managed.  She requested not to do a GAD-7 or PHQ-9 today.  She endorses adequate sleep and appetite.    Patient reports that she continues to maintain her sobriety and receives assistance through [facility].   She notes that she attends several meetings weekly.  Patient notes that she is down to cigarettes a day.   At this time patient does not want to restart hydroxyzine. She requested that provider filled gabapentin. Provider was agreeable to fill gabapentin 100 mg three times daily. She will continue all other medications as prescribed. No other concerns noted at this time.     Visit Diagnosis:    ICD-10-CM   1. Tobacco dependence  F17.200 nicotine (NICODERM CQ) 21 mg/24hr patch    2. Generalized anxiety disorder  F41.1 gabapentin (NEURONTIN) 100 MG capsule    QUEtiapine (SEROQUEL) 300 MG tablet    QUEtiapine Fumarate 150 MG TABS    3. 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)  F31.70 QUEtiapine (SEROQUEL) 300 MG tablet    QUEtiapine Fumarate 150 MG TABS      Past Psychiatric History: depression, PTSD, bipolar affective disorder, tobacco dependence, and opioid addiction (in remission four years).  Past Medical History:  Past Medical History:  Diagnosis Date   Anxiety    Bipolar disorder ([place])    COPD (chronic obstructive pulmonary disease) ([place])    Depression    HOH (hard of hearing)    Hyperlipidemia 2021   Hyperthyroidism    Opiate addiction ([place])    On chronic suboxone since 2017 this last go around.     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  Old records from [place] with CT of soft tissue of chest and neck 11/2017:  No findings to suggest cause of stridorous breathing and no obvious lung disease.  Past Surgical History:  Procedure Laterality Date   ROBOTIC ASSISTED BILATERAL SALPINGO OOPHERECTOMY N/A 01/08/2022   Procedure: XI ROBOTIC ASSISTED BILATERAL SALPINGO OOPHORECTOMY;  Surgeon: Lafonda Mosses, MD;  Location: [facility];  Service: Gynecology;  Laterality: N/A;   THYROIDECTOMY  2019   THYROIDECTOMY      Family Psychiatric History: Substance use sister  Family History:  Family History   Problem Relation Age of Onset   HIV/AIDS Mother        from drug abuse   Drug abuse Mother    Drug abuse Brother    Hepatitis C Brother        From IV drug abuse   Drug abuse Sister    Drug abuse Sister    Drug abuse Sister    Drug abuse Sister    Drug abuse Sister    Cancer Sister        lung   Drug abuse Sister     Social History:  Social History   Socioeconomic History   Marital status: Legally Separated    Spouse name: Not on file   Number of children: 2   Years of education: Not on file   Highest education level: Some college, no degree  Occupational History   Occupation: unemployed    Comment: Supported by daughter, ex husband and boyfriend.  Tobacco Use   Smoking status: Every Day    Packs/day: 1.00    Years: 42.00    Total pack years: 42.00    Types: Cigarettes   Smokeless tobacco: Never  Vaping Use   Vaping Use: Not on file  Substance and Sexual Activity   Alcohol use: No   Drug use: Not Currently    Comment: Benzodiazepines / Klonopin/opiate addiction.  Also history of crack cocaine abuse.  2021 clean for 2017 on Suboxone.    Sexual activity: Not Currently  Other Topics Concern   Not on file  Social History Narrative   ** Merged History Encounter **       Lives alone, though her boyfriend will visit for 2 months at a time.   Social Determinants of Health   Financial Resource Strain: Not on file  Food Insecurity: No Food Insecurity (01/08/2022)   Hunger Vital Sign    Worried About Running Out of Food in the Last Year: Never true    Ran Out of Food in the Last Year: Never true  Transportation Needs: No Transportation Needs (01/08/2022)   PRAPARE - Hydrologist (Medical): No    Lack of Transportation (Non-Medical): No  Physical Activity: Not on file  Stress: Not on file  Social Connections: Not on file    Allergies:  Allergies  Allergen Reactions   Amoxicillin Swelling   Latex Hives and Swelling   Amoxicillin  Swelling and Rash    Metabolic Disorder Labs: Lab Results  Component Value Date   HGBA1C 5.8 (H) 01/07/2022   MPG 119.76 01/07/2022   No results found for: "PROLACTIN" Lab Results  Component Value Date   CHOL 274 (H) 11/04/2020   TRIG 498 (H) 11/04/2020   HDL 29 (L) 11/04/2020   LDLCALC 150 (H) 11/04/2020   LDLCALC 210 (H) 08/15/2019   Lab Results  Component Value Date   TSH 8.640 (H) 05/14/2021   TSH 16.500 (H) 01/08/2021    Therapeutic Level Labs: No results found for: "LITHIUM" No results found for: "VALPROATE" No results found for: "CBMZ"  Current Medications: Current Outpatient Medications  Medication  Sig Dispense Refill   albuterol (VENTOLIN HFA) 108 (90 Base) MCG/ACT inhaler Inhale 2 puffs into the lungs every 6 (six) hours as needed for wheezing or shortness of breath. 8 g 0   albuterol (VENTOLIN HFA) 108 (90 Base) MCG/ACT inhaler Inhale 2 puffs into the lungs as needed for wheezing or shortness of breath.     atorvastatin (LIPITOR) 40 MG tablet TAKE 1 TABLET BY MOUTH DAILY WITH THE EVENING MEAL 30 tablet 0   buprenorphine-naloxone (SUBOXONE) 8-2 mg SUBL SL tablet Place 1 tablet under the tongue 2 (two) times daily.     Fluticasone-Umeclidin-Vilant (TRELEGY ELLIPTA) 100-62.5-25 MCG/ACT AEPB Inhale 1 puff into the lungs daily. 1 each 11   Fluticasone-Umeclidin-Vilant (TRELEGY ELLIPTA) 100-62.5-25 MCG/ACT AEPB Inhale 1 puff into the lungs daily.     gabapentin (NEURONTIN) 100 MG capsule Take 1 capsule (100 mg total) by mouth 3 (three) times daily. 90 capsule 2   ibuprofen (ADVIL) 200 MG tablet Take 400-600 mg by mouth 2 (two) times daily as needed for moderate pain.     levothyroxine (SYNTHROID) 137 MCG tablet Take 1 tablet (137 mcg total) by mouth daily before breakfast. 30 tablet 10   levothyroxine (SYNTHROID) 137 MCG tablet Take 137 mcg by mouth daily before breakfast.     methadone (DOLOPHINE) 10 MG/ML solution Take 54 mg by mouth daily. Verified with RN Minna Antis 54 mg     nicotine (NICODERM CQ) 21 mg/24hr patch Place 1 patch (21 mg total) onto the skin daily. 30 patch 3   prazosin (MINIPRESS) 2 MG capsule Take 2 capsules (4 mg total) by mouth at bedtime. 60 capsule 3   QUEtiapine (SEROQUEL) 300 MG tablet Take 1 tablet (300 mg total) by mouth at bedtime. 30 tablet 3   QUEtiapine Fumarate 150 MG TABS Take 150 mg by mouth every morning. 30 tablet 3   senna-docusate (SENOKOT-S) 8.6-50 MG tablet Take 2 tablets by mouth at bedtime. Do not take if having loose stools 60 tablet 1   No current facility-administered medications for this visit.     Musculoskeletal: Strength & Muscle Tone:  Unable to assess due to telephone visit Gait & Station:  Unable to assess due to telephone visit Patient leans: N/A  Psychiatric Specialty Exam: Review of Systems  There were no vitals taken for this visit.There is no height or weight on file to calculate BMI.  General Appearance:  Unable to assess due to telephone visit  Eye Contact:   Unable to assess due to telephone visit  Speech:  Clear and Coherent and Normal Rate  Volume:  Normal  Mood:  Euthymic  Affect:  Appropriate and Congruent  Thought Process:  Coherent, Goal Directed, and Linear  Orientation:  Full (Time, Place, and Person)  Thought Content: WDL and Logical   Suicidal Thoughts:  No  Homicidal Thoughts:  No  Memory:  Immediate;   Good Recent;   Good Remote;   Good  Judgement:  Good  Insight:  Good  Psychomotor Activity:   Unable to assess due to telephone visit  Concentration:  Concentration: Good and Attention Span: Good  Recall:  Good  Fund of Knowledge: Good  Language: Good  Akathisia:   Unable to assess due to telephone visit  Handed:  Right  AIMS (if indicated): not done  Assets:  Communication Skills Desire for Improvement Financial Resources/Insurance Housing Intimacy Leisure Time Social Support  ADL's:  Intact  Cognition: WNL  Sleep:  Good   Screenings: GAD-7  Flowsheet Row Clinical Support from 12/30/2021 in [facility] Counselor from 10/07/2021 in [facility]  Total GAD-7 Score 15 7      PHQ2-9    Flowsheet Row Clinical Support from [facility]/30/2021 in [facility] Counselor from 10/07/2021 in [facility]  PHQ-2 Total Score 0 1  PHQ-9 Total Score 4 --      Flowsheet Row ED to Hosp-Admission (Discharged) from 01/06/2022 in [facility] from 12/08/2021 in [place] Counselor from 10/07/2021 in [place] No Risk No Risk Error: Q3, 4, or 5 should not be populated when Q2 is No        Assessment and Plan: Patient reports that she feels stable mentally.  She notes while hospitalized she was started on gabapentin for pain and notes that it has been managing her pain and anxiety.  She asked writer to refill this medication for her.  Provider agreeable to refill gabapentin 100 mg 3 times daily.  At this time she wishes to discontinue hydroxyzine.  She will continue all other medications as prescribed.  1. Tobacco dependence  Continue- nicotine (NICODERM CQ) 21 mg/24hr patch; Place 1 patch (21 mg total) onto the skin daily.  Dispense: 30 patch; Refill: 3  2. Generalized anxiety disorder  Continue- gabapentin (NEURONTIN) 100 MG capsule; Take 1 capsule (100 mg total) by mouth 3 (three) times daily.  Dispense: 90 capsule; Refill: 2 Continue- QUEtiapine (SEROQUEL) 300 MG tablet; Take 1 tablet (300 mg total) by mouth at bedtime.  Dispense: 30 tablet; Refill: 3 Continue- QUEtiapine Fumarate 150 MG TABS; Take 150 mg by mouth every morning.  Dispense: 30 tablet; Refill: 3  3. 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)  Continue- QUEtiapine (SEROQUEL) 300 MG tablet; Take 1 tablet (300 mg total) by mouth at bedtime.  Dispense: 30 tablet;  Refill: 3 Continue- QUEtiapine Fumarate 150 MG TABS; Take 150 mg by mouth every morning.  Dispense: 30 tablet; Refill: 3   Follow-up in 3 months Follow-up with therapy  Salley Slaughter, NP 02/10/2022, 12:16 PM

## 2022-02-11 ENCOUNTER — Ambulatory Visit: Payer: Self-pay | Admitting: Internal Medicine

## 2022-02-11 ENCOUNTER — Encounter: Payer: Self-pay | Admitting: Internal Medicine

## 2022-02-11 VITALS — BP 150/96 | HR 84 | Resp 16 | Ht 61.0 in | Wt 184.0 lb

## 2022-02-11 DIAGNOSIS — Z720 Tobacco use: Secondary | ICD-10-CM

## 2022-02-11 DIAGNOSIS — E782 Mixed hyperlipidemia: Secondary | ICD-10-CM

## 2022-02-11 DIAGNOSIS — R7989 Other specified abnormal findings of blood chemistry: Secondary | ICD-10-CM

## 2022-02-11 DIAGNOSIS — R030 Elevated blood-pressure reading, without diagnosis of hypertension: Secondary | ICD-10-CM

## 2022-02-11 DIAGNOSIS — N83519 Torsion of ovary and ovarian pedicle, unspecified side: Secondary | ICD-10-CM

## 2022-02-11 DIAGNOSIS — F3170 Bipolar disorder, currently in remission, most recent episode unspecified: Secondary | ICD-10-CM

## 2022-02-11 DIAGNOSIS — Z0000 Encounter for general adult medical examination without abnormal findings: Secondary | ICD-10-CM

## 2022-02-11 DIAGNOSIS — K029 Dental caries, unspecified: Secondary | ICD-10-CM

## 2022-02-11 DIAGNOSIS — Z1231 Encounter for screening mammogram for malignant neoplasm of breast: Secondary | ICD-10-CM

## 2022-02-11 DIAGNOSIS — E039 Hypothyroidism, unspecified: Secondary | ICD-10-CM

## 2022-02-11 DIAGNOSIS — F4310 Post-traumatic stress disorder, unspecified: Secondary | ICD-10-CM

## 2022-02-11 NOTE — Progress Notes (Signed)
Subjective:    Patient ID: Meghan Contreras, female   DOB: 04-05-1965, 56 y.o.   MRN: [id number]   HPI  CPE without pap  1.  Pap:  Last pap 01/08/2022 and normal.  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      2.  Mammogram:  Did not go in for mammogram when ordered twice in 2021.  States she was "messed up then" with her bipolar disorder.  No definite history of breast cancer--confusing history of younger sister at age 30 yo, but sounds more like lung cancer.  3.  Osteoprevention:  Drinks 1 cup of soy mild daily.  Limited by cost.  No calcium/Vit D supplements.  Does walk daily for about 20 minutes.    4.  Guaiac Cards/FIT:  Never returned  5.  Colonoscopy:  Never--afraid.  6.  Immunizations:  Has not had influenza, Pneumococcal, COVID, shingles vaccines Immunization History  Administered Date(s) Administered   DTaP 03/04/2009   Influenza Inj Mdck Quad Pf 01/08/2021   Influenza Inj Mdck Quad With Preservative 01/08/2020   PFIZER(Purple Top)SARS-COV-2 Vaccination 08/15/2019, 09/04/2019   Pfizer Covid-19 Vaccine Bivalent Booster 22yr & up 01/08/2021   Td 01/08/2016     7.  Glucose/Cholesterol:  prediabetes with A1C recently during hospitalization in November at 5.8%.  Has not had cholesterol checked since 10/2020 when quite high.  Do not believe she was on a statin then. Lipid Panel     Component Value Date/Time   CHOL 274 (H) 11/04/2020 0912   TRIG 498 (H) 11/04/2020 0912   HDL 29 (L) 11/04/2020 0912   LDLCALC 150 (H) 11/04/2020 0912   LABVLDL 95 (H) 11/04/2020 0912   8.  Hypothyroidism:  TSH not at good level on 05/14/2021.  Was off her levothyroxine at the time, but we also had increased the dose at her last visit prior  Current Meds  Medication Sig   albuterol (VENTOLIN HFA) 108 (90 Base) MCG/ACT inhaler Inhale 2 puffs into the lungs every 6 (six)  hours as needed for wheezing or shortness of breath.   atorvastatin (LIPITOR) 40 MG tablet TAKE 1 TABLET BY MOUTH DAILY WITH THE EVENING MEAL   Fluticasone-Umeclidin-Vilant (TRELEGY ELLIPTA) 100-62.5-25 MCG/ACT AEPB Inhale 1 puff into the lungs daily.   gabapentin (NEURONTIN) 100 MG capsule Take 1 capsule (100 mg total) by mouth 3 (three) times daily.   ibuprofen (ADVIL) 200 MG tablet Take 400-600 mg by mouth 2 (two) times daily as needed for moderate pain.   levothyroxine (SYNTHROID) 137 MCG tablet Take 1 tablet (137 mcg total) by mouth daily before breakfast.   methadone (DOLOPHINE) 10 MG/ML solution Take 54 mg by mouth daily. Verified with RN DMinna Antis54 mg   nicotine (NICODERM CQ) 21 mg/24hr patch Place 1 patch (21 mg total) onto the skin daily.   prazosin (MINIPRESS) 2 MG capsule Take 2 capsules (4 mg total) by mouth at bedtime.   QUEtiapine (SEROQUEL XR) 300 MG 24 hr tablet Take 1 tablet (300 mg total) by mouth at bedtime.   QUEtiapine (SEROQUEL) 300 MG tablet Take 1 tablet (300 mg total) by mouth at bedtime.   senna-docusate (SENOKOT-S) 8.6-50 MG tablet Take 2 tablets by mouth at bedtime. Do not take if having loose stools   Allergies  Allergen Reactions   Amoxicillin Swelling   Latex Hives and Swelling   Amoxicillin Swelling  and Rash   Past Medical History:  Diagnosis Date   Anxiety    Bipolar disorder ([place])    COPD (chronic obstructive pulmonary disease) (HCC)    Depression    HOH (hard of hearing)    Hyperlipidemia 2021   Hyperthyroidism    Left Ovarian torsion 01/06/2022   with large fibroma   Opiate addiction ([place])    On chronic suboxone since 2017 this last go around.     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  Old records from [place] with CT of soft tissue of chest and neck 11/2017:  No findings to suggest cause of stridorous breathing and no obvious lung disease.     Past Surgical History:   Procedure Laterality Date   ROBOTIC ASSISTED BILATERAL SALPINGO OOPHERECTOMY N/A 01/08/2022   Procedure: XI ROBOTIC ASSISTED BILATERAL SALPINGO OOPHORECTOMY;  Surgeon: Lafonda Mosses, MD;  Location: [facility];  Service: Gynecology;  Laterality: N/A;   THYROIDECTOMY  2019   Family History  Problem Relation Age of Onset   HIV/AIDS Mother        from drug abuse   Drug abuse Mother    Drug abuse Brother    Hepatitis C Brother        From IV drug abuse   Drug abuse Sister    Drug abuse Sister    Drug abuse Sister    Drug abuse Sister    Drug abuse Sister    Cancer Sister        lung   Drug abuse Sister    Social History   Socioeconomic History   Marital status: Legally Separated    Spouse name: Not on file   Number of children: 2   Years of education: Not on file   Highest education level: Some college, no degree  Occupational History   Occupation: unemployed    Comment: Supported by daughter, ex husband and boyfriend.  Tobacco Use   Smoking status: Every Day    Packs/day: 0.25    Years: 42.00    Total pack years: 10.50    Types: Cigarettes    Passive exposure: Current   Smokeless tobacco: Never   Tobacco comments:    Taking patch off at night and then smokes after 9 p.m.  Vaping Use   Vaping Use: Never used  Substance and Sexual Activity   Alcohol use: No   Drug use: Not Currently    Comment: Benzodiazepines / Klonopin/opiate addiction.  Also history of crack cocaine abuse.  2021 clean for 2017 on Suboxone.    Sexual activity: Not Currently  Other Topics Concern   Not on file  Social History Narrative    Lives alone, though her boyfriend will visit for 2 months at a time.   He lives with his daughter in [place] otherwise.   Social Determinants of Health   Financial Resource Strain: Medium Risk (02/11/2022)   Overall Financial Resource Strain (CARDIA)    Difficulty of Paying Living Expenses: Somewhat hard  Food Insecurity: No Food Insecurity (02/11/2022)    Hunger Vital Sign    Worried About Running Out of Food in the Last Year: Never true    Ran Out of Food in the Last Year: Never true  Transportation Needs: No Transportation Needs (02/11/2022)   PRAPARE - Hydrologist (Medical): No    Lack of Transportation (Non-Medical): No  Physical Activity: Not on file  Stress: Not on file  Social Connections: Not on file  Intimate Partner Violence: Not At Risk (02/11/2022)   Humiliation, Afraid, Rape, and Kick questionnaire    Fear of Current or Ex-Partner: No    Emotionally Abused: No    Physically Abused: No    Sexually Abused: No     Review of Systems  HENT:  Positive for dental problem (losing all of teeth.).   Respiratory:  Positive for shortness of breath (was intubated for pelvic surgery.  Breathing was improving subsequently, but feels when out in cold, more trouble.).   Cardiovascular:  Negative for chest pain, palpitations and leg swelling.  Gastrointestinal:  Negative for abdominal pain and blood in stool.  Genitourinary:  Negative for vaginal bleeding and vaginal discharge.  Neurological:  Positive for numbness (left leg after hospitalization off and on.).      Objective:   BP (!) 150/96 (BP Location: Left Arm, Patient Position: Sitting, Cuff Size: Normal)   Pulse 84   Resp 16   Ht 5\' 1"  (1.549 m)   Wt 184 lb (83.5 kg)   BMI 34.77 kg/m   Physical Exam HENT:     Head: Normocephalic and atraumatic.     Right Ear: Tympanic membrane, ear canal and external ear normal.     Left Ear: Tympanic membrane, ear canal and external ear normal.     Nose: Nose normal.     Mouth/Throat:     Mouth: Mucous membranes are moist.     Pharynx: Oropharynx is clear.     Comments: Missing and caried teeth Eyes:     Extraocular Movements: Extraocular movements intact.     Conjunctiva/sclera: Conjunctivae normal.     Pupils: Pupils are equal, round, and reactive to light.     Comments: Discs sharp  Neck:      Thyroid: No thyroid mass or thyromegaly.  Cardiovascular:     Rate and Rhythm: Normal rate and regular rhythm.     Heart sounds: S1 normal and S2 normal. No murmur heard.    No friction rub. No S3 or S4 sounds.     Comments: No carotid bruits.  Carotid, radial, femoral, DP and PT pulses normal and equal.   Pulmonary:     Effort: Pulmonary effort is normal.     Breath sounds: Stridor (Significant glottal noise (baseline)) and decreased air movement present. Decreased breath sounds present.     Comments: Baseline is for very deep, raspy voice Chest:  Breasts:    Right: No inverted nipple, mass or nipple discharge.     Left: No inverted nipple, mass or nipple discharge.  Abdominal:     General: Bowel sounds are normal.     Palpations: Abdomen is soft. There is no hepatomegaly, splenomegaly or mass.     Tenderness: There is no abdominal tenderness.     Hernia: No hernia is present.  Genitourinary:    Comments: Deferred as recently fully evaluated with pelvic surgery and pap. Musculoskeletal:        General: Normal range of motion.     Cervical back: Normal range of motion and neck supple.     Right lower leg: No edema.     Left lower leg: No edema.  Lymphadenopathy:     Head:     Right side of head: No submental or submandibular adenopathy.     Left side of head: No submental or submandibular adenopathy.     Cervical: No cervical adenopathy.     Upper  Body:     Right upper body: No supraclavicular or axillary adenopathy.     Left upper body: No supraclavicular or axillary adenopathy.     Lower Body: No right inguinal adenopathy. No left inguinal adenopathy.  Skin:    General: Skin is warm.     Capillary Refill: Capillary refill takes less than 2 seconds.     Findings: No rash.  Neurological:     General: No focal deficit present.     Mental Status: She is alert and oriented to person, place, and time.     Cranial Nerves: Cranial nerves 2-12 are intact.     Sensory: Sensation  is intact.     Motor: Motor function is intact.     Coordination: Coordination is intact.     Gait: Gait is intact.     Deep Tendon Reflexes: Reflexes are normal and symmetric.  Psychiatric:        Mood and Affect: Mood normal.        Behavior: Behavior normal. Behavior is cooperative.      Assessment & Plan    CPE Mammogram ordered again with [facility] Pap recently normal Thinking about COVID vaccination. Return for influenza and pneumococcal.   Recommended Shingrix, which we also do not have currently. Return FIT with fasting labs.  2.  Prediabetes:  recent A1C 5.8%.  She is working on diet and physical activity  3.  Hyperlipidemia:  reportedly consistent with statin use for some time.  Fasting labs in next 2 weeks  4.  Recent BSO for ovarion torsion on left due to very large benign fibroma.  5.  Hypothyroidism:  labs in 2 weeks.  6.  Elevated creatinine with hospitalization in November:  labs in 2 weeks.  7.  COPD and continue tobacco use:  encouraged NOT removing patch to smoke, but to get rid of all smoking paraphernalia and NOT use cigarettes.  Encouraged her to get her partner to stop smoking with her.  He smokes outside.  8.  Drug Use Disorder:  in program and doing well.  9.  Bipolar Disorder with PTSD:  now with a consistent psychiatrist and doing well.    10.  Elevated BP:  BP check with labs in 2 weeks.  11.  Dental referral for loss of teeth

## 2022-03-08 ENCOUNTER — Other Ambulatory Visit: Payer: Self-pay

## 2022-03-19 ENCOUNTER — Telehealth: Payer: Self-pay | Admitting: Internal Medicine

## 2022-03-19 NOTE — Telephone Encounter (Signed)
Called and wants to talk to Dr. Amil Amen about pt and alternatives on where to get Disability form filled out. Available after 3pm.

## 2022-03-19 NOTE — Telephone Encounter (Signed)
She needs to go to DSS

## 2022-03-25 NOTE — Telephone Encounter (Signed)
notified

## 2022-03-29 ENCOUNTER — Encounter (HOSPITAL_COMMUNITY): Payer: Self-pay

## 2022-03-29 ENCOUNTER — Emergency Department (HOSPITAL_BASED_OUTPATIENT_CLINIC_OR_DEPARTMENT_OTHER)
Admit: 2022-03-29 | Discharge: 2022-03-29 | Disposition: A | Discharge status: Home / Self Care | Payer: Self-pay | Attending: Emergency Medicine | Admitting: Emergency Medicine

## 2022-03-29 ENCOUNTER — Emergency Department (HOSPITAL_COMMUNITY): Payer: Self-pay

## 2022-03-29 ENCOUNTER — Emergency Department (HOSPITAL_COMMUNITY)
Admission: EM | Admit: 2022-03-29 | Discharge: 2022-03-29 | Disposition: A | Discharge status: Home / Self Care | Payer: Self-pay | Attending: Emergency Medicine | Admitting: Emergency Medicine

## 2022-03-29 DIAGNOSIS — R609 Edema, unspecified: Secondary | ICD-10-CM

## 2022-03-29 DIAGNOSIS — F17200 Nicotine dependence, unspecified, uncomplicated: Secondary | ICD-10-CM | POA: Insufficient documentation

## 2022-03-29 DIAGNOSIS — R600 Localized edema: Secondary | ICD-10-CM | POA: Insufficient documentation

## 2022-03-29 DIAGNOSIS — J449 Chronic obstructive pulmonary disease, unspecified: Secondary | ICD-10-CM | POA: Insufficient documentation

## 2022-03-29 DIAGNOSIS — Z91040 Latex allergy status: Secondary | ICD-10-CM | POA: Insufficient documentation

## 2022-03-29 DIAGNOSIS — Z1152 Encounter for screening for COVID-19: Secondary | ICD-10-CM | POA: Insufficient documentation

## 2022-03-29 DIAGNOSIS — Z7951 Long term (current) use of inhaled steroids: Secondary | ICD-10-CM | POA: Insufficient documentation

## 2022-03-29 DIAGNOSIS — M7989 Other specified soft tissue disorders: Secondary | ICD-10-CM

## 2022-03-29 LAB — BASIC METABOLIC PANEL
Anion gap: 5 (ref 5–15)
BUN: 23 mg/dL — ABNORMAL HIGH (ref 6–20)
CO2: 33 mmol/L — ABNORMAL HIGH (ref 22–32)
Calcium: 8.9 mg/dL (ref 8.9–10.3)
Chloride: 103 mmol/L (ref 98–111)
Creatinine, Ser: 0.93 mg/dL (ref 0.44–1.00)
GFR, Estimated: >60 mL/min (ref >60)
Glucose, Bld: 87 mg/dL (ref 70–99)
Potassium: 4.3 mmol/L (ref 3.5–5.1)
Sodium: 141 mmol/L (ref 135–145)

## 2022-03-29 LAB — BRAIN NATRIURETIC PEPTIDE: B Natriuretic Peptide: 25.4 pg/mL (ref 0.0–100.0)

## 2022-03-29 LAB — I-STAT CHEM 8, ED
BUN: 22 mg/dL — ABNORMAL HIGH (ref 6–20)
Calcium, Ion: 1.19 mmol/L (ref 1.15–1.40)
Chloride: 102 mmol/L (ref 98–111)
Creatinine, Ser: 1 mg/dL (ref 0.44–1.00)
Glucose, Bld: 86 mg/dL (ref 70–99)
HCT: 37 % (ref 36.0–46.0)
Hemoglobin: 12.6 g/dL (ref 12.0–15.0)
Potassium: 4.3 mmol/L (ref 3.5–5.1)
Sodium: 145 mmol/L (ref 135–145)
TCO2: 33 mmol/L — ABNORMAL HIGH (ref 22–32)

## 2022-03-29 LAB — CBC
HCT: 37 % (ref 36.0–46.0)
Hemoglobin: 11.4 g/dL — ABNORMAL LOW (ref 12.0–15.0)
MCH: 25.4 pg — ABNORMAL LOW (ref 26.0–34.0)
MCHC: 30.8 g/dL (ref 30.0–36.0)
MCV: 82.6 fL (ref 80.0–100.0)
Platelets: 309 10*3/uL (ref 150–400)
RBC: 4.48 MIL/uL (ref 3.87–5.11)
RDW: 15.2 % (ref 11.5–15.5)
WBC: 6.7 10*3/uL (ref 4.0–10.5)
nRBC: 0 % (ref 0.0–0.2)

## 2022-03-29 LAB — I-STAT BETA HCG BLOOD, ED (MC, WL, AP ONLY): I-stat hCG, quantitative: <5 m[IU]/mL (ref <5)

## 2022-03-29 LAB — D-DIMER, QUANTITATIVE: D-Dimer, Quant: 1.24 ug/mL-FEU — ABNORMAL HIGH (ref 0.00–0.50)

## 2022-03-29 LAB — TROPONIN I (HIGH SENSITIVITY)
Troponin I (High Sensitivity): 5 ng/L (ref <18)
Troponin I (High Sensitivity): 5 ng/L (ref <18)

## 2022-03-29 LAB — RESP PANEL BY RT-PCR (RSV, FLU A&B, COVID)  RVPGX2
Influenza A by PCR: NEGATIVE
Influenza B by PCR: NEGATIVE
Resp Syncytial Virus by PCR: NEGATIVE
SARS Coronavirus 2 by RT PCR: NEGATIVE

## 2022-03-29 MED ORDER — FUROSEMIDE 20 MG PO TABS: 20.0000 mg | ORAL_TABLET | Freq: Every day | ORAL | 0 refills | Status: DC

## 2022-03-29 MED ORDER — PREDNISONE 20 MG PO TABS
60.0000 mg | ORAL_TABLET | ORAL | Status: AC
  Administered 2022-03-29: 60 mg via ORAL
  Filled 2022-03-29: qty 3

## 2022-03-29 MED ORDER — FUROSEMIDE 40 MG PO TABS
20.0000 mg | ORAL_TABLET | Freq: Once | ORAL | Status: AC
  Administered 2022-03-29: 20 mg via ORAL
  Filled 2022-03-29: qty 1

## 2022-03-29 MED ORDER — PREDNISONE 20 MG PO TABS: 40.0000 mg | ORAL_TABLET | Freq: Every day | ORAL | 0 refills | Status: DC

## 2022-03-29 MED ORDER — IOHEXOL 350 MG/ML SOLN
75.0000 mL | Freq: Once | INTRAVENOUS | Status: AC | PRN
  Administered 2022-03-29: 75 mL via INTRAVENOUS

## 2022-03-29 NOTE — ED Notes (Signed)
US at bedside

## 2022-03-29 NOTE — ED Notes (Signed)
Patient transported to CT 

## 2022-03-29 NOTE — ED Provider Triage Note (Signed)
Emergency Medicine Provider Triage Evaluation Note  Meghan Contreras , a 57 y.o. female  was evaluated in triage.  Pt complains of shob, chest pain, bilateral leg swelling. Symptoms started 2 days ago. Patient takes inhaler for COPD but stated it is not working. Patient stated she is coughing "pink sputum." Patient stated chest pain is from breathing so hard.   Patient denied loss of sensation/motor skills, fevers, recent illness, sick contacts, N/V/D, abd pain  Review of Systems  Positive: See HPI Negative: See HPI  Physical Exam  BP 126/85 (BP Location: Left Arm)   Pulse 86   Temp 99 F (37.2 C) (Oral)   Resp 20   SpO2 94%  Gen:   Awake, not in distress Resp:  Increaesd effort; not tripoding, accessory muscle use, stridor (this is baseline) MSK:   Moves extremities without difficulty, bilateral erythematous 1+ lower leg edema Other:  2+ PT/DP pulses  Medical Decision Making  Medically screening exam initiated at 9:52 AM.  Appropriate orders placed.  Meghan Contreras was informed that the remainder of the evaluation will be completed by another provider, this initial triage assessment does not replace that evaluation, and the importance of remaining in the ED until their evaluation is complete.  Workup intiated. Not in distress at moment. Given portable oxygen   Chuck Hint, PA-C 03/29/22 1002

## 2022-03-29 NOTE — ED Triage Notes (Signed)
Pt presents with c/o leg swelling and chest pain that started on Saturday. Pt reports hx of COPD.

## 2022-03-29 NOTE — Progress Notes (Signed)
Bilateral lower extremity venous duplex has been completed. Preliminary results can be found in CV Proc through chart review.  Results were given to Dr. Vanita Panda.  03/29/22 3:02 PM Meghan Contreras RVT

## 2022-03-29 NOTE — Discharge Instructions (Signed)
As discussed, today's evaluation has been somewhat reassuring.  However, with your ongoing cigarette addiction, is important to continue efforts to stop smoking.  Please take all medication as prescribed for the next 4 days and follow-up with your primary care physician and your oncologist.  Return here for concerning changes in your condition.

## 2022-03-29 NOTE — ED Provider Notes (Signed)
[place] EMERGENCY DEPARTMENT AT [facility] Provider Note   CSN: [id number] Arrival date & time: 03/29/22  0848     History  Chief Complaint  Patient presents with   Chest Pain   Leg Swelling    Meghan Contreras is a 57 y.o. female.  HPI Patient with multiple medical problems including COPD, currently smoking, recent surgery of pelvic mass now presents with lower extremity swelling, dyspnea.  She notes that where she typically does have some degree of dyspnea, this is only slightly worse, lower extremity swelling is new.  No new chest pain, no new syncope, no new fever.    Home Medications Prior to Admission medications   Medication Sig Start Date End Date Taking? Authorizing Provider  albuterol (VENTOLIN HFA) 108 (90 Base) MCG/ACT inhaler Inhale 2 puffs into the lungs every 6 (six) hours as needed for wheezing or shortness of breath. 10/09/21   Mack Hook, MD  albuterol (VENTOLIN HFA) 108 (90 Base) MCG/ACT inhaler Inhale 2 puffs into the lungs as needed for wheezing or shortness of breath. 10/09/21   [provider]  atorvastatin (LIPITOR) 40 MG tablet TAKE 1 TABLET BY MOUTH DAILY WITH THE EVENING MEAL 01/18/22   Mack Hook, MD  buprenorphine-naloxone (SUBOXONE) 8-2 mg SUBL SL tablet Place 1 tablet under the tongue 2 (two) times daily. Patient not taking: Reported on 02/11/2022    [provider]  Fluticasone-Umeclidin-Vilant (TRELEGY ELLIPTA) 100-62.5-25 MCG/ACT AEPB Inhale 1 puff into the lungs daily. 09/02/21   Mack Hook, MD  Fluticasone-Umeclidin-Vilant (TRELEGY ELLIPTA) 100-62.5-25 MCG/ACT AEPB Inhale 1 puff into the lungs daily.    [provider]  gabapentin (NEURONTIN) 100 MG capsule Take 1 capsule (100 mg total) by mouth 3 (three) times daily. 02/10/22 05/11/22  Salley Slaughter, NP  ibuprofen (ADVIL) 200 MG tablet Take 400-600 mg by mouth 2 (two) times daily as needed for moderate pain.    [provider]  levothyroxine (SYNTHROID) 137 MCG tablet Take 1 tablet (137 mcg total) by mouth daily before breakfast. 10/26/21   Mack Hook, MD  levothyroxine (SYNTHROID) 137 MCG tablet Take 137 mcg by mouth daily before breakfast.    [provider]  methadone (DOLOPHINE) 10 MG/ML solution Take 54 mg by mouth daily. Verified with RN Minna Antis 54 mg    [provider]  nicotine (NICODERM CQ) 21 mg/24hr patch Place 1 patch (21 mg total) onto the skin daily. 02/10/22   Salley Slaughter, NP  prazosin (MINIPRESS) 2 MG capsule Take 2 capsules (4 mg total) by mouth at bedtime. 02/10/22   Salley Slaughter, NP  QUEtiapine (SEROQUEL XR) 300 MG 24 hr tablet Take 1 tablet (300 mg total) by mouth at bedtime. 02/10/22   Salley Slaughter, NP  QUEtiapine (SEROQUEL) 300 MG tablet Take 1 tablet (300 mg total) by mouth at bedtime. 02/10/22   Eulis Canner E, NP  senna-docusate (SENOKOT-S) 8.6-50 MG tablet Take 2 tablets by mouth at bedtime. Do not take if having loose stools 01/11/22   Joylene John D, NP      Allergies    Amoxicillin and Latex    Review of Systems   Review of Systems  All other systems reviewed and are negative.   Physical Exam Updated Vital Signs BP 104/75 (BP Location: Left Arm)   Pulse 79   Temp 98.2 F (36.8 C) (Oral)   Resp 18   SpO2 94%  Physical Exam Vitals and nursing note reviewed.  Constitutional:  General: She is not in acute distress.    Appearance: She is well-developed.  HENT:     Head: Normocephalic and atraumatic.  Eyes:     Conjunctiva/sclera: Conjunctivae normal.  Cardiovascular:     Rate and Rhythm: Normal rate and regular rhythm.  Pulmonary:     Effort: Tachypnea present.     Breath sounds: Decreased breath sounds present.  Abdominal:     General: There is no distension.  Musculoskeletal:     Right lower leg: Edema present.     Left lower leg: Edema present.  Skin:    General: Skin is warm and dry.   Neurological:     Mental Status: She is alert and oriented to person, place, and time.     Cranial Nerves: No cranial nerve deficit.  Psychiatric:        Mood and Affect: Mood normal.     ED Results / Procedures / Treatments   Labs (all labs ordered are listed, but only abnormal results are displayed) Labs Reviewed  BASIC METABOLIC PANEL - Abnormal; Notable for the following components:      Result Value   CO2 33 (*)    BUN 23 (*)    All other components within normal limits  CBC - Abnormal; Notable for the following components:   Hemoglobin 11.4 (*)    MCH 25.4 (*)    All other components within normal limits  D-DIMER, QUANTITATIVE - Abnormal; Notable for the following components:   D-Dimer, Quant 1.24 (*)    All other components within normal limits  I-STAT CHEM 8, ED - Abnormal; Notable for the following components:   BUN 22 (*)    TCO2 33 (*)    All other components within normal limits  RESP PANEL BY RT-PCR (RSV, FLU A&B, COVID)  RVPGX2  BRAIN NATRIURETIC PEPTIDE  I-STAT BETA HCG BLOOD, ED ([facility], [facility], [facility] ONLY)  TROPONIN I (HIGH SENSITIVITY)  TROPONIN I (HIGH SENSITIVITY)    EKG None  Radiology CT Angio Chest PE W/Cm &/Or Wo Cm  Result Date: 03/29/2022 CLINICAL DATA:  Chest pain with leg swelling. Clinical concern for acute pulmonary embolism. Elevated D-dimer levels. EXAM: CT ANGIOGRAPHY CHEST WITH CONTRAST TECHNIQUE: Multidetector CT imaging of the chest was performed using the standard protocol during bolus administration of intravenous contrast. Multiplanar CT image reconstructions and MIPs were obtained to evaluate the vascular anatomy. RADIATION DOSE REDUCTION: This exam was performed according to the departmental dose-optimization program which includes automated exposure control, adjustment of the mA and/or kV according to patient size and/or use of iterative reconstruction technique. CONTRAST:  52m OMNIPAQUE IOHEXOL 350 MG/ML SOLN COMPARISON:  Chest radiographs  03/29/2022 and 12/08/2021. Abdominal CT 01/07/2022. FINDINGS: Cardiovascular: The pulmonary arteries are well opacified with contrast to the level of the subsegmental branches. There is no evidence of acute pulmonary embolism. No acute or significant systemic arterial abnormalities. The heart size is normal. There is no pericardial effusion. Mediastinum/Nodes: There are no enlarged mediastinal, hilar or axillary lymph nodes.Small hiatal hernia. The thyroid gland and trachea appear unremarkable. Lungs/Pleura: Trace bilateral pleural effusions. No pneumothorax. Linear atelectasis at both lung bases and within the lingula. More confluent right middle lobe opacity with volume loss and air bronchograms appears chronic, unchanged from previous abdominal CT, likely reflecting chronic atelectasis. Upper abdomen: Multiple renal cysts are grossly unchanged from previous abdominal CT. The visualized upper abdomen is otherwise unremarkable. Musculoskeletal/Chest wall: There are nondisplaced fractures of the left 3rd and 4th ribs anteriorly which may  be subacute. No displaced fractures are identified. Mild degenerative changes in the thoracic spine. Review of the MIP images confirms the above findings. IMPRESSION: 1. No evidence of acute pulmonary embolism or other acute vascular findings in the chest. 2. Nondisplaced fractures of the left 3rd and 4th ribs anteriorly which may be subacute. No displaced fractures are identified. 3. Chronic right middle lobe atelectasis. 4. Trace bilateral pleural effusions. 5. Please refer to previous abnormal abdominopelvic CT report and follow-up recommendations. Electronically Signed   By: Richardean Sale M.D.   On: 03/29/2022 15:59   VAS [place] LOWER EXTREMITY VENOUS (DVT) (7a-7p)  Result Date: 03/29/2022  Lower Venous DVT Study Patient Name:  KADEJAH SANDIFORD  Date of Exam:   03/29/2022 Medical Rec #: [id number]      Accession #:    [id number] Date of Birth: 1965/08/12      Patient Gender: F  Patient Age:   37 years Exam Location:  [facility] Procedure:      VAS [place] LOWER EXTREMITY VENOUS (DVT) Referring Phys: Herbie Baltimore Milanie Rosenfield --------------------------------------------------------------------------------  Indications: Swelling.  Risk Factors: None identified. Limitations: Poor ultrasound/tissue interface. Comparison Study: No prior studies. Performing Technologist: Oliver Hum RVT  Examination Guidelines: A complete evaluation includes B-mode imaging, spectral Doppler, color Doppler, and power Doppler as needed of all accessible portions of each vessel. Bilateral testing is considered an integral part of a complete examination. Limited examinations for reoccurring indications may be performed as noted. The reflux portion of the exam is performed with the patient in reverse Trendelenburg.  +---------+---------------+---------+-----------+----------+--------------+ RIGHT    CompressibilityPhasicitySpontaneityPropertiesThrombus Aging +---------+---------------+---------+-----------+----------+--------------+ CFV      Full           Yes      Yes                                 +---------+---------------+---------+-----------+----------+--------------+ SFJ      Full                                                        +---------+---------------+---------+-----------+----------+--------------+ FV Prox  Full                                                        +---------+---------------+---------+-----------+----------+--------------+ FV Mid   Full                                                        +---------+---------------+---------+-----------+----------+--------------+ FV DistalFull                                                        +---------+---------------+---------+-----------+----------+--------------+ PFV      Full                                                         +---------+---------------+---------+-----------+----------+--------------+  POP      Full           Yes      Yes                                 +---------+---------------+---------+-----------+----------+--------------+ PTV      Full                                                        +---------+---------------+---------+-----------+----------+--------------+ PERO     Full                                                        +---------+---------------+---------+-----------+----------+--------------+   +---------+---------------+---------+-----------+----------+--------------+ LEFT     CompressibilityPhasicitySpontaneityPropertiesThrombus Aging +---------+---------------+---------+-----------+----------+--------------+ CFV      Full           Yes      Yes                                 +---------+---------------+---------+-----------+----------+--------------+ SFJ      Full                                                        +---------+---------------+---------+-----------+----------+--------------+ FV Prox  Full                                                        +---------+---------------+---------+-----------+----------+--------------+ FV Mid   Full                                                        +---------+---------------+---------+-----------+----------+--------------+ FV DistalFull                                                        +---------+---------------+---------+-----------+----------+--------------+ PFV      Full                                                        +---------+---------------+---------+-----------+----------+--------------+ POP      Full           Yes      Yes                                 +---------+---------------+---------+-----------+----------+--------------+  PTV      Full                                                         +---------+---------------+---------+-----------+----------+--------------+ PERO     Full                                                        +---------+---------------+---------+-----------+----------+--------------+    Summary: RIGHT: - There is no evidence of deep vein thrombosis in the lower extremity.  - No cystic structure found in the popliteal fossa.  LEFT: - There is no evidence of deep vein thrombosis in the lower extremity.  - No cystic structure found in the popliteal fossa.  *See table(s) above for measurements and observations.    Preliminary    DG Chest 2 View  Result Date: 03/29/2022 CLINICAL DATA:  Chest pain EXAM: CHEST - 2 VIEW COMPARISON:  01/11/2019 FINDINGS: No pleural effusion. No pneumothorax. Unchanged cardiac and mediastinal contours. Linear bibasilar opacities are favored to represent atelectasis. Improved aeration of the bilateral lungs compared to prior exam. No displaced rib fractures. Visualized upper abdomen is unremarkable. Vertebral body heights are maintained. Degenerative changes of the bilateral AC joints IMPRESSION: Linear bibasilar opacities are favored to represent atelectasis. Electronically Signed   By: Marin Roberts M.D.   On: 03/29/2022 09:38    Procedures Procedures    Medications Ordered in ED Medications  iohexol (OMNIPAQUE) 350 MG/ML injection 75 mL (75 mLs Intravenous Contrast Given 03/29/22 1539)    ED Course/ Medical Decision Making/ A&P                             Medical Decision Making Patient with multiple medical issues, including COPD recent abdominal surgery presents with lower extremity swelling, fatigue.  Suspicion for COPD exacerbation, though other considerations, particularly given her malignancy history include PE/DVT, hepatic or renal dysfunction, heart failure. Labs sent monitor started.   Cardiac 80 sinus normal Pulse ox 98% room air normal   Amount and/or Complexity of Data Reviewed External Data Reviewed:  notes. Labs: ordered. Decision-making details documented in ED Course. Radiology: ordered and independent interpretation performed. Decision-making details documented in ED Course.  Risk OTC drugs. Prescription drug management. Decision regarding hospitalization.   4:30 PM Patient awake, alert, not on oxygen, vital signs remain unchanged.  She is now accompanied by her brother.  After obtaining consent we discussed all findings, and he is aware of her medical history, cigarette addiction, surgery.  Findings today generally reassuring, no evidence for pulmonary embolism, no evidence for DVT, some suspicion for COPD exacerbation, though other considerations including postop changes, fluid retention, but no evidence for hepatic or substantial renal dysfunction.  Patient and I in length conversation about cigarette addiction cessation, importance of following with primary care and OB with consideration of persistent malignancy, though this seems less likely given the generally reassuring findings today.  Patient amenable to this follow-up plan, will start a short course of steroids, Lasix given her fluid retention, discharged in stable condition.        Final Clinical Impression(s) / ED Diagnoses  Final diagnoses:  Peripheral edema  Smoking addiction    Rx / DC Orders ED Discharge Orders          Ordered    furosemide (LASIX) 20 MG tablet  Daily        03/29/22 1632    predniSONE (DELTASONE) 20 MG tablet  Daily with breakfast        03/29/22 1632              Carmin Muskrat, MD 03/29/22 [phone]

## 2022-04-05 ENCOUNTER — Telehealth (HOSPITAL_COMMUNITY): Payer: Self-pay | Admitting: Psychiatry

## 2022-04-05 ENCOUNTER — Other Ambulatory Visit (HOSPITAL_COMMUNITY): Payer: Self-pay | Admitting: Psychiatry

## 2022-04-05 ENCOUNTER — Other Ambulatory Visit (HOSPITAL_COMMUNITY): Payer: Self-pay

## 2022-04-05 DIAGNOSIS — F17200 Nicotine dependence, unspecified, uncomplicated: Secondary | ICD-10-CM

## 2022-04-05 DIAGNOSIS — F411 Generalized anxiety disorder: Secondary | ICD-10-CM

## 2022-04-05 DIAGNOSIS — F3170 Bipolar disorder, currently in remission, most recent episode unspecified: Secondary | ICD-10-CM

## 2022-04-05 MED ORDER — QUETIAPINE FUMARATE 150 MG PO TABS: 300.0000 mg | ORAL_TABLET | Freq: Every day | ORAL | 3 refills | Status: DC

## 2022-04-05 MED ORDER — QUETIAPINE FUMARATE 100 MG PO TABS: 150.0000 mg | ORAL_TABLET | Freq: Every day | ORAL | 3 refills | Status: DC

## 2022-04-05 MED ORDER — GABAPENTIN 100 MG PO CAPS: 100.0000 mg | ORAL_CAPSULE | Freq: Three times a day (TID) | ORAL | 3 refills | Status: DC

## 2022-04-05 MED ORDER — PRAZOSIN HCL 2 MG PO CAPS: 4.0000 mg | ORAL_CAPSULE | Freq: Every day | ORAL | 3 refills | Status: DC

## 2022-04-05 MED ORDER — QUETIAPINE FUMARATE ER 300 MG PO TB24: 300.0000 mg | ORAL_TABLET | Freq: Every day | ORAL | 3 refills | Status: DC

## 2022-04-05 MED ORDER — NICOTINE 21 MG/24HR TD PT24: 21.0000 mg | MEDICATED_PATCH | Freq: Every day | TRANSDERMAL | 3 refills | Status: DC

## 2022-04-05 MED ORDER — QUETIAPINE FUMARATE 300 MG PO TABS: 300.0000 mg | ORAL_TABLET | Freq: Every day | ORAL | 3 refills | Status: DC

## 2022-04-05 NOTE — Telephone Encounter (Signed)
Patient informed that Seroquel XR and Seroquel 150 is not available at the health department.  She also notes that they only have the generic brand.  Order rewritten.  Patient is now taking Seroquel 300 mg nightly.  She is also taking Seroquel 150 mg nightly.  Patient will have to take 1 and 1/2 tablets of Seroquel 100 mg as her pharmacy does not have the 150 mg tablets.  No other concerns noted at this time.

## 2022-04-05 NOTE — Telephone Encounter (Signed)
Medication refilled and sent to preferred pharmacy

## 2022-04-21 ENCOUNTER — Ambulatory Visit: Payer: Self-pay | Admitting: Internal Medicine

## 2022-05-12 ENCOUNTER — Encounter (HOSPITAL_COMMUNITY): Payer: Self-pay

## 2022-05-12 ENCOUNTER — Telehealth (HOSPITAL_COMMUNITY): Payer: No Payment, Other | Admitting: Psychiatry

## 2022-06-07 ENCOUNTER — Emergency Department (HOSPITAL_COMMUNITY): Payer: 59

## 2022-06-07 ENCOUNTER — Encounter (HOSPITAL_COMMUNITY): Payer: Self-pay

## 2022-06-07 ENCOUNTER — Emergency Department (HOSPITAL_BASED_OUTPATIENT_CLINIC_OR_DEPARTMENT_OTHER): Admit: 2022-06-07 | Discharge: 2022-06-07 | Disposition: A | Discharge status: Home / Self Care | Payer: 59

## 2022-06-07 ENCOUNTER — Other Ambulatory Visit: Payer: Self-pay

## 2022-06-07 ENCOUNTER — Emergency Department (HOSPITAL_COMMUNITY)
Admission: EM | Admit: 2022-06-07 | Discharge: 2022-06-07 | Disposition: A | Discharge status: Home / Self Care | Payer: 59 | Attending: Emergency Medicine | Admitting: Emergency Medicine

## 2022-06-07 DIAGNOSIS — E876 Hypokalemia: Secondary | ICD-10-CM | POA: Insufficient documentation

## 2022-06-07 DIAGNOSIS — Z7951 Long term (current) use of inhaled steroids: Secondary | ICD-10-CM | POA: Diagnosis not present

## 2022-06-07 DIAGNOSIS — J449 Chronic obstructive pulmonary disease, unspecified: Secondary | ICD-10-CM | POA: Insufficient documentation

## 2022-06-07 DIAGNOSIS — M7989 Other specified soft tissue disorders: Secondary | ICD-10-CM | POA: Diagnosis present

## 2022-06-07 DIAGNOSIS — Z720 Tobacco use: Secondary | ICD-10-CM | POA: Insufficient documentation

## 2022-06-07 DIAGNOSIS — Z91040 Latex allergy status: Secondary | ICD-10-CM | POA: Diagnosis not present

## 2022-06-07 DIAGNOSIS — R7989 Other specified abnormal findings of blood chemistry: Secondary | ICD-10-CM

## 2022-06-07 DIAGNOSIS — R600 Localized edema: Secondary | ICD-10-CM

## 2022-06-07 LAB — CBC WITH DIFFERENTIAL/PLATELET
Abs Immature Granulocytes: 0.02 10*3/uL (ref 0.00–0.07)
Basophils Absolute: 0 10*3/uL (ref 0.0–0.1)
Basophils Relative: 1 %
Eosinophils Absolute: 0.1 10*3/uL (ref 0.0–0.5)
Eosinophils Relative: 1 %
HCT: 35.3 % — ABNORMAL LOW (ref 36.0–46.0)
Hemoglobin: 10.3 g/dL — ABNORMAL LOW (ref 12.0–15.0)
Immature Granulocytes: 0 %
Lymphocytes Relative: 31 %
Lymphs Abs: 1.5 10*3/uL (ref 0.7–4.0)
MCH: 22.8 pg — ABNORMAL LOW (ref 26.0–34.0)
MCHC: 29.2 g/dL — ABNORMAL LOW (ref 30.0–36.0)
MCV: 78.1 fL — ABNORMAL LOW (ref 80.0–100.0)
Monocytes Absolute: 0.4 10*3/uL (ref 0.1–1.0)
Monocytes Relative: 8 %
Neutro Abs: 2.9 10*3/uL (ref 1.7–7.7)
Neutrophils Relative %: 59 %
Platelets: 297 10*3/uL (ref 150–400)
RBC: 4.52 MIL/uL (ref 3.87–5.11)
RDW: 20.3 % — ABNORMAL HIGH (ref 11.5–15.5)
WBC: 4.9 10*3/uL (ref 4.0–10.5)
nRBC: 0.4 % — ABNORMAL HIGH (ref 0.0–0.2)

## 2022-06-07 LAB — URINALYSIS, ROUTINE W REFLEX MICROSCOPIC
Bilirubin Urine: NEGATIVE
Glucose, UA: NEGATIVE mg/dL
Hgb urine dipstick: NEGATIVE
Ketones, ur: NEGATIVE mg/dL
Leukocytes,Ua: NEGATIVE
Nitrite: NEGATIVE
Protein, ur: NEGATIVE mg/dL
Specific Gravity, Urine: 1.009 (ref 1.005–1.030)
pH: 7 (ref 5.0–8.0)

## 2022-06-07 LAB — BASIC METABOLIC PANEL
Anion gap: 8 (ref 5–15)
BUN: 16 mg/dL (ref 6–20)
CO2: 32 mmol/L (ref 22–32)
Calcium: 8.6 mg/dL — ABNORMAL LOW (ref 8.9–10.3)
Chloride: 99 mmol/L (ref 98–111)
Creatinine, Ser: 0.86 mg/dL (ref 0.44–1.00)
GFR, Estimated: >60 mL/min (ref >60)
Glucose, Bld: 107 mg/dL — ABNORMAL HIGH (ref 70–99)
Potassium: 3.4 mmol/L — ABNORMAL LOW (ref 3.5–5.1)
Sodium: 139 mmol/L (ref 135–145)

## 2022-06-07 LAB — BRAIN NATRIURETIC PEPTIDE: B Natriuretic Peptide: 684.4 pg/mL — ABNORMAL HIGH (ref 0.0–100.0)

## 2022-06-07 MED ORDER — POTASSIUM CHLORIDE CRYS ER 20 MEQ PO TBCR
40.0000 meq | EXTENDED_RELEASE_TABLET | Freq: Once | ORAL | Status: AC
  Administered 2022-06-07: 40 meq via ORAL
  Filled 2022-06-07: qty 2

## 2022-06-07 MED ORDER — FUROSEMIDE 20 MG PO TABS: 20.0000 mg | ORAL_TABLET | Freq: Every day | ORAL | 0 refills | Status: DC

## 2022-06-07 MED ORDER — FUROSEMIDE 10 MG/ML IJ SOLN
40.0000 mg | Freq: Once | INTRAMUSCULAR | Status: AC
  Administered 2022-06-07: 40 mg via INTRAVENOUS
  Filled 2022-06-07: qty 4

## 2022-06-07 MED ORDER — POTASSIUM CHLORIDE CRYS ER 20 MEQ PO TBCR: 20.0000 meq | EXTENDED_RELEASE_TABLET | Freq: Two times a day (BID) | ORAL | 0 refills | Status: DC

## 2022-06-07 MED ORDER — FUROSEMIDE 10 MG/ML IJ SOLN
20.0000 mg | Freq: Once | INTRAMUSCULAR | Status: AC
  Administered 2022-06-07: 20 mg via INTRAVENOUS
  Filled 2022-06-07: qty 4

## 2022-06-07 NOTE — ED Triage Notes (Signed)
C/o lower extremity swelling and pain x2 days with sob.  Swelling noted to lower extremities.  Hx copd Denies hx CHF.

## 2022-06-07 NOTE — ED Provider Triage Note (Signed)
Emergency Medicine Provider Triage Evaluation Note  Marylynne Farina , a 57 y.o. female  was evaluated in triage.  Pt complains of severe leg pain over the past couple of days.  She also believes they are swollen.  Oxygen saturation on arrival is in the 87-92 range.  History of COPD.  She feels around her baseline.  Review of Systems  Positive:  Negative:   Physical Exam  BP (!) 166/100 (BP Location: Left Arm)   Pulse 86   Temp 98.8 F (37.1 C) (Oral)   Resp (!) 22   SpO2 93%  Gen:   Awake, no distress   Resp:  Normal effort  MSK:   Moves extremities without difficulty Other:  Pitting edema bilateral lower extremities.  Some overlying erythematous skin.  Crackles on auscultation.  Medical Decision Making  Medically screening exam initiated at 3:52 PM.  Appropriate orders placed.  Saleta Mencia was informed that the remainder of the evaluation will be completed by another provider, this initial triage assessment does not replace that evaluation, and the importance of remaining in the ED until their evaluation is complete.  Patient is hypertensive in triage.  Reports that she is on medication for this and endorses compliance.  Unable to state whether or not she has heart failure.  Per chart review she does have recurrent respiratory failure however it appears to be in the setting of COPD and occasionally drug use.  Will hold a BNP and chest x-ray, suspect leg pain is secondary to fluid retention +/- cellulitis.   Saddie Benders, [place] 06/07/22 1554

## 2022-06-07 NOTE — ED Provider Notes (Signed)
[place] EMERGENCY DEPARTMENT AT [facility] Provider Note   CSN: [id number] Arrival date & time: 06/07/22  1530     History  Chief Complaint  Patient presents with   Shortness of Breath    Meghan Contreras is a 57 y.o. female with a past medical history of COPD and history of respiratory failure presenting today with pain in her legs.  She reports that she has been having increased swelling and pain in her bilateral lower extremities all the way up to her hips for the past 2 days.  Has a history of COPD and says that her shortness of breath is baseline.  No recent travel, history of DVT/PE, estrogen use but does report continued tobacco use and surgery in November.  Denies any history of heart failure.  Does say that she has been in the department previously for peripheral edema.  Shortness of Breath Associated symptoms: no chest pain and no cough        Home Medications Prior to Admission medications   Medication Sig Start Date End Date Taking? Authorizing Provider  albuterol (VENTOLIN HFA) 108 (90 Base) MCG/ACT inhaler Inhale 2 puffs into the lungs every 6 (six) hours as needed for wheezing or shortness of breath. 10/09/21   Julieanne Manson, MD  albuterol (VENTOLIN HFA) 108 (90 Base) MCG/ACT inhaler Inhale 2 puffs into the lungs as needed for wheezing or shortness of breath. 10/09/21   [provider]  atorvastatin (LIPITOR) 40 MG tablet TAKE 1 TABLET BY MOUTH DAILY WITH THE EVENING MEAL 01/18/22   Julieanne Manson, MD  buprenorphine-naloxone (SUBOXONE) 8-2 mg SUBL SL tablet Place 1 tablet under the tongue 2 (two) times daily. Patient not taking: Reported on 02/11/2022    [provider]  Fluticasone-Umeclidin-Vilant (TRELEGY ELLIPTA) 100-62.5-25 MCG/ACT AEPB Inhale 1 puff into the lungs daily. 09/02/21   Julieanne Manson, MD  Fluticasone-Umeclidin-Vilant (TRELEGY ELLIPTA) 100-62.5-25 MCG/ACT AEPB Inhale 1 puff into the lungs daily.    [provider]  furosemide (LASIX) 20 MG tablet Take 1 tablet (20 mg total) by mouth daily for 4 days. 03/29/22 04/02/22  Gerhard Munch, MD  gabapentin (NEURONTIN) 100 MG capsule Take 1 capsule (100 mg total) by mouth 3 (three) times daily. 04/05/22   Shanna Cisco, NP  ibuprofen (ADVIL) 200 MG tablet Take 400-600 mg by mouth 2 (two) times daily as needed for moderate pain.    [provider]  levothyroxine (SYNTHROID) 137 MCG tablet Take 1 tablet (137 mcg total) by mouth daily before breakfast. 10/26/21   Julieanne Manson, MD  levothyroxine (SYNTHROID) 137 MCG tablet Take 137 mcg by mouth daily before breakfast.    [provider]  methadone (DOLOPHINE) 10 MG/ML solution Take 54 mg by mouth daily. Verified with RN Mariea Clonts 54 mg    [provider]  nicotine (NICODERM CQ) 21 mg/24hr patch Place 1 patch (21 mg total) onto the skin daily. 04/05/22   Shanna Cisco, NP  prazosin (MINIPRESS) 2 MG capsule Take 2 capsules (4 mg total) by mouth at bedtime. 04/05/22   Shanna Cisco, NP  predniSONE (DELTASONE) 20 MG tablet Take 2 tablets (40 mg total) by mouth daily with breakfast. For the next four days 03/29/22   Gerhard Munch, MD  QUEtiapine (SEROQUEL) 100 MG tablet Take 1.5 tablets (150 mg total) by mouth at bedtime. 04/05/22   Shanna Cisco, NP  QUEtiapine (SEROQUEL) 300 MG tablet Take 1 tablet (300 mg total) by mouth at bedtime. 04/05/22  Toy Cookey E, NP  senna-docusate (SENOKOT-S) 8.6-50 MG tablet Take 2 tablets by mouth at bedtime. Do not take if having loose stools 01/11/22   Warner Mccreedy D, NP      Allergies    Amoxicillin, Latex, and Penicillins    Review of Systems   Review of Systems  Respiratory:  Negative for cough, chest tightness and shortness of breath.   Cardiovascular:  Positive for leg swelling. Negative for chest pain.    Physical Exam Updated Vital Signs BP (!) 166/100 (BP Location: Left Arm)   Pulse 86   Temp 98.8  F (37.1 C) (Oral)   Resp (!) 22   Wt 83 kg   SpO2 93%   BMI 34.57 kg/m  Physical Exam Vitals and nursing note reviewed.  Constitutional:      Appearance: Normal appearance.  HENT:     Head: Normocephalic and atraumatic.  Eyes:     General: No scleral icterus.    Conjunctiva/sclera: Conjunctivae normal.  Cardiovascular:     Rate and Rhythm: Normal rate and regular rhythm.  Pulmonary:     Effort: Pulmonary effort is normal. No respiratory distress.     Breath sounds: Examination of the right-upper field reveals decreased breath sounds. Examination of the left-upper field reveals decreased breath sounds. Examination of the right-middle field reveals decreased breath sounds. Examination of the left-middle field reveals decreased breath sounds. Examination of the right-lower field reveals decreased breath sounds. Examination of the left-lower field reveals decreased breath sounds. Decreased breath sounds present. No wheezing, rhonchi or rales.  Musculoskeletal:        General: Normal range of motion.     Comments: Bilateral lower extremity pitting edema to the thigh.  Some associated erythematous skin however does not appear grossly cellulitic  Skin:    Findings: No rash.  Neurological:     Mental Status: She is alert.  Psychiatric:        Mood and Affect: Mood normal.     ED Results / Procedures / Treatments   Labs (all labs ordered are listed, but only abnormal results are displayed) Labs Reviewed  CBC WITH DIFFERENTIAL/PLATELET - Abnormal; Notable for the following components:      Result Value   Hemoglobin 10.3 (*)    HCT 35.3 (*)    MCV 78.1 (*)    MCH 22.8 (*)    MCHC 29.2 (*)    RDW 20.3 (*)    nRBC 0.4 (*)    All other components within normal limits  BASIC METABOLIC PANEL  BRAIN NATRIURETIC PEPTIDE  URINALYSIS, ROUTINE W REFLEX MICROSCOPIC    EKG None  Radiology DG Chest 2 View  Result Date: 06/07/2022 CLINICAL DATA:  Shortness of breath EXAM: CHEST - 2  VIEW COMPARISON:  Multiple chest x-rays, most recently March 29, 2022 FINDINGS: The cardiomediastinal silhouette is unchanged in contour. Chronic linear bibasilar pulmonary opacities, likely atelectasis/scarring. No new focal pulmonary opacity. Small right pleural effusion. No pneumothorax. The visualized upper abdomen is unremarkable. No acute osseous abnormality. IMPRESSION: 1. Chronic linear bibasilar pulmonary opacities, likely atelectasis/scarring. 2. Small right pleural effusion. Electronically Signed   By: Jacob Moores M.D.   On: 06/07/2022 16:07    Procedures Procedures   Medications Ordered in ED Medications  furosemide (LASIX) injection 40 mg (40 mg Intravenous Given 06/07/22 1717)  potassium chloride SA (KLOR-CON M) CR tablet 40 mEq (40 mEq Oral Given 06/07/22 1717)  furosemide (LASIX) injection 20 mg (20 mg Intravenous Given 06/07/22 1830)  ED Course/ Medical Decision Making/ A&P Clinical Course as of 06/07/22 1820  Mon Jun 07, 2022  1819 DVT study negative.  I went and reevaluated the patient and her peripheral edema is improving after 40 of Lasix.  She could use 20 more so this was ordered.  She says that her pain is much better now that there has been some fluid taken off.  We discussed her workup and she hopes to go home and follow-up with cardiology outpatient. [MR]    Clinical Course User Index [MR] Jagger Demonte, Gabriel Cirri, PA-C                             Medical Decision Making Amount and/or Complexity of Data Reviewed Labs: ordered. Radiology: ordered.  Risk Prescription drug management.   57 year old female presenting today with leg pain and swelling.  Differential includes but is not limited to cellulitis, peripheral edema plus or minus heart failure, DVT, PAD, cramping.  This is not an exhaustive differential.    Past Medical History / Co-morbidities / Social History: COPD, tobacco use disorder, previous peripheral edema   Additional history: Viewed patient's  similar visit in January.  Had an elevated dimer but negative CT PE scan.  She also has never had an elevation in her BNP.  No echocardiogram in the system.   Physical Exam: Pertinent physical exam findings include Pitting edema bilateral lower extremities Some decrease in breath sounds  Lab Tests: I ordered, and personally interpreted labs.  The pertinent results include: Potassium 3.4.  Will replete given patient is going to be diuresed with Lasix BNP 684.4   Imaging Studies: I ordered and independently visualized and interpreted patient's chest x-ray.  Radiologist says that areas of opacities are more consistent with atelectasis.  I do agree with this.  Afebrile, not tachycardic with a normal white count, low suspicion pneumonia in the setting  DVT study negative   Cardiac Monitoring:  The patient was maintained on a cardiac monitor.  I viewed and interpreted the cardiac monitored which showed an underlying rhythm of: Sinus   Medications: Lasix & Potassium     MDM/Disposition: This is a 57 year old female presenting with leg pain and swelling.  On my original evaluation in triage I had a higher suspicion for peripheral edema.  Swelling was pitting.  BNP was 684.4.  2 months ago this was normal.   After further history taking, patient reported surgery at the end of 2023 as well as continued tobacco use.  She also says that she normally does not have swelling to nearly the level of the hip.  DVT study was also ordered which was negative  She was given 40 of Lasix originally and had some improvement of her swelling and pain.  She was mildly hypokalemic so this was repleted with 40 p.o. due to continued diuresis.  She is not hypoxic on my repeat exams.  She continues to tell me that her shortness of breath is baseline for her COPD.  There is no pulmonary edema on her x-ray.  Negative DVT study, do not believe she needs CT angiography for PE.  I discussed this patient with my attending and  plan will be to diurese the patient slightly more and discharged home although I did consider admission for new onset heart failure.  I discussed this with the patient and she is agreeable.  8p: Patient has been given 60 of IV Lasix.  Swelling is much better  and pain is improved.  She is stable for discharge.  Will be given 4 days worth of outpatient Lasix and potassium as well.   I discussed this case with my attending physician Dr. Rubin PayorPickering who cosigned this note including patient's presenting symptoms, physical exam, and planned diagnostics and interventions. Attending physician stated agreement with plan or made changes to plan which were implemented.      Final Clinical Impression(s) / ED Diagnoses Final diagnoses:  Peripheral edema  Elevated brain natriuretic peptide (BNP) level    Rx / DC Orders ED Discharge Orders          Ordered    furosemide (LASIX) 20 MG tablet  Daily,   Status:  Discontinued        06/07/22 1822    furosemide (LASIX) 20 MG tablet  Daily        06/07/22 1822    Ambulatory referral to Cardiology       Comments: If you have not heard from the Cardiology office within the next 72 hours please call [phone].   06/07/22 1823    potassium chloride SA (KLOR-CON M) 20 MEQ tablet  2 times daily        06/07/22 2006           Results and diagnoses were explained to the patient. Return precautions discussed in full. Patient had no additional questions and expressed complete understanding.   This chart was dictated using voice recognition software.  Despite best efforts to proofread,  errors can occur which can change the documentation meaning.    Woodroe ChenRedwine, Jackalyn Haith A, PA-C 06/07/22 2011    Benjiman CorePickering, Nathan, MD 06/07/22 [phone]

## 2022-06-07 NOTE — Discharge Instructions (Addendum)
You came to the emergency department with pain in your legs.  This was secondary to fluid retention, also known as peripheral edema.  Please read the information about this attached to these discharge papers.  As we discussed, sometimes this edema is caused by heart failure.  Your blood test suggest that you may have some degree of underlying heart failure.  This should be followed up with cardiology.  They should be calling you within the next 3 days for an appointment for them to further evaluate you.  You must return to the emergency department with any worsening shortness of breath, severe chest pain, worsening leg swelling or further concerns.  I have sent Lasix to your pharmacy for the next 4 days.  This will make you urinate so I suggest you take it in the morning instead of at night.  Additionally, take the potassium pills for the next week as well.  It was a pleasure to meet you and we hope you feel better!

## 2022-06-07 NOTE — Progress Notes (Signed)
Bilateral lower extremity venous duplex has been completed. Preliminary results can be found in CV Proc through chart review.  Results were given to [facility] Redwine PA.  06/07/22 6:16 PM Olen Cordial RVT

## 2022-06-15 ENCOUNTER — Telehealth: Payer: Self-pay

## 2022-06-15 NOTE — Telephone Encounter (Signed)
Patient needs a after ED visit. Needs to schedule if an appointment become available.

## 2022-06-21 NOTE — Telephone Encounter (Signed)
Patient has been scheduled

## 2022-06-22 ENCOUNTER — Ambulatory Visit (INDEPENDENT_AMBULATORY_CARE_PROVIDER_SITE_OTHER): Payer: 59 | Admitting: Internal Medicine

## 2022-06-22 VITALS — BP 120/84 | HR 80 | Resp 12 | Ht 61.0 in | Wt 186.0 lb

## 2022-06-22 DIAGNOSIS — R600 Localized edema: Secondary | ICD-10-CM

## 2022-06-22 DIAGNOSIS — E039 Hypothyroidism, unspecified: Secondary | ICD-10-CM

## 2022-06-22 DIAGNOSIS — R7989 Other specified abnormal findings of blood chemistry: Secondary | ICD-10-CM

## 2022-06-22 DIAGNOSIS — Z720 Tobacco use: Secondary | ICD-10-CM | POA: Diagnosis not present

## 2022-06-22 DIAGNOSIS — J4489 Other specified chronic obstructive pulmonary disease: Secondary | ICD-10-CM

## 2022-06-22 DIAGNOSIS — D649 Anemia, unspecified: Secondary | ICD-10-CM

## 2022-06-22 DIAGNOSIS — Z716 Tobacco abuse counseling: Secondary | ICD-10-CM

## 2022-06-22 DIAGNOSIS — H9193 Unspecified hearing loss, bilateral: Secondary | ICD-10-CM

## 2022-06-22 DIAGNOSIS — R739 Hyperglycemia, unspecified: Secondary | ICD-10-CM

## 2022-06-22 DIAGNOSIS — F411 Generalized anxiety disorder: Secondary | ICD-10-CM

## 2022-06-22 DIAGNOSIS — E782 Mixed hyperlipidemia: Secondary | ICD-10-CM

## 2022-06-22 MED ORDER — QUETIAPINE FUMARATE 100 MG PO TABS: ORAL_TABLET | ORAL | 3 refills | Status: DC

## 2022-06-22 MED ORDER — LEVOTHYROXINE SODIUM 150 MCG PO TABS: 137.0000 ug | ORAL_TABLET | Freq: Every day | ORAL | 11 refills | Status: DC

## 2022-06-22 MED ORDER — FUROSEMIDE 40 MG PO TABS
40.0000 mg | ORAL_TABLET | Freq: Every day | ORAL | 6 refills | Status: DC
Start: 2022-06-22 — End: 2022-12-07

## 2022-06-22 MED ORDER — TRELEGY ELLIPTA 100-62.5-25 MCG/ACT IN AEPB: 1.0000 | INHALATION_SPRAY | Freq: Every day | RESPIRATORY_TRACT | 11 refills | Status: DC

## 2022-06-22 MED ORDER — ALBUTEROL SULFATE HFA 108 (90 BASE) MCG/ACT IN AERS: 2.0000 | INHALATION_SPRAY | RESPIRATORY_TRACT | 4 refills | Status: DC | PRN

## 2022-06-22 MED ORDER — GABAPENTIN 100 MG PO CAPS: 100.0000 mg | ORAL_CAPSULE | Freq: Three times a day (TID) | ORAL | 3 refills | Status: DC

## 2022-06-22 MED ORDER — QUETIAPINE FUMARATE 300 MG PO TABS: 300.0000 mg | ORAL_TABLET | Freq: Every day | ORAL | 3 refills | Status: DC

## 2022-06-22 NOTE — Progress Notes (Signed)
Subjective:    Patient ID: Meghan Contreras, female   DOB: February 08, 1966, 57 y.o.   MRN: [id number]   HPI  Here for follow up from ED visit on April 8th with about 2 week history of bilateral LE edema and elevated BNP in the ED at 684.4.  She was also mildly anemic again with hgb at 10.3.   Small right pleural effusion on CXR No DVT on [place] of legs ECG with diffuse flipped Twave She denied increased dyspnea at the time.  She denied CP with this visit.   She states she has been sleeping sitting up on sofa with legs dependent for about 1 year. States she has not been able to sleep in bed as she cannot breathe.   Describes PND once to twice a month even when sleeping sitting. States when her breathing gets worse and with her legs dependent is when her legs seem to swell.   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  She did have anterior chest pain with January episode, but work up did not support cardiac injury or PE at the time.    She has hypothyroidism and has not had labs performed for some time.  Last check showed she was not adequately replaced 1 year ago.  She never responded to our calls as to what she was doing with her hormone.    Since ED visit, no chest pain, even with exertion.  She already has an appt with cardiology on May 28th, Dr. Anne Fu.   She was given 4 days of Furosemide 20 mg, which helped a bit, but still with some edema.    Current Meds  Medication Sig   atorvastatin (LIPITOR) 40 MG tablet TAKE 1 TABLET BY MOUTH DAILY WITH THE EVENING MEAL   Fluticasone-Umeclidin-Vilant (TRELEGY ELLIPTA) 100-62.5-25 MCG/ACT AEPB Inhale 1 puff into the lungs daily.   gabapentin (NEURONTIN) 100 MG capsule Take 1 capsule (100 mg total) by mouth 3 (three) times daily. (Patient taking differently: Take 100 mg by mouth as needed.)   ibuprofen (ADVIL) 200 MG tablet  Take 400-600 mg by mouth 2 (two) times daily as needed for moderate pain.   levothyroxine (SYNTHROID) 137 MCG tablet Take 1 tablet (137 mcg total) by mouth daily before breakfast.   methadone (DOLOPHINE) 10 MG/ML solution Take 55 mg by mouth daily. Verified with RN Mariea Clonts 54 mg   nicotine (NICODERM CQ) 21 mg/24hr patch Place 1 patch (21 mg total) onto the skin daily.   prazosin (MINIPRESS) 2 MG capsule Take 2 capsules (4 mg total) by mouth at bedtime.   QUEtiapine (SEROQUEL) 100 MG tablet Take 1.5 tablets (150 mg total) by mouth at bedtime. (Patient taking differently: Take by mouth. Taking 150 mg in the morning and 300 mg at bedtime)   QUEtiapine (SEROQUEL) 300 MG tablet Take 1 tablet (300 mg total) by mouth at bedtime.    Allergies  Allergen Reactions   Amoxicillin Swelling and Rash   Latex Hives and Swelling   Penicillins Other (See Comments)    Facial swelling     Review of Systems    Objective:   BP 120/84 (BP Location: Left Arm, Patient Position: Sitting, Cuff Size: Normal)   Pulse 80   Resp 12   Ht 5\' 1"  (1.549 m)   Wt 186 lb (84.4 kg)   BMI 35.14 kg/m  Physical Exam NAD, does have difficulty lying flat with breathing during exam. Loud glottal breathing Raised voice needed to communicate with Meghan Contreras due to decreased hearing.  HEENT:  PERRL, EOMI, TMs pearly gray, throat without injection Neck:  Supple, No adenopathy, no thyroid palpated Chest:  upper airway noise as above.  Decreased BS throughout. CV:  RRR with normal S1 and S2, No S3, S4 or murmur.  No JVD.  No carotid bruits.  Carotid, radial and DP pulses normal and equal Abd:  S, NT, No HSM or mass.   LE:  skin shiny with edema and mild to moderate pitting to below knees.   Assessment & Plan   Peripheral LE edema:  increase Furosemide to 40 mg daily in morning.  To look at labels with sodium and limit.  Encouraged her to elevate legs with sleep regardless of sitting up.  BNP ordered, but will need  to return as we did not have the proper tube to draw.  CMP. TSH.  Will need echo to evaluate EF.  Follow up in 2 weeks for recheck/weight/BMP.  2.  Anemia: FIT to return in 2 weeks.  Previously, iron studies normal, but will check again.  B12, RBC folate.  Will need colonoscopy once cardiac status assessed and stable.  3.  Hyperlipidemia:  did not return for labs after physical in December.  FLP today  4.  COPD:  Trelegy and Albuterol reordered for new pharmacy as she now has Medicaid.  Discussed she can no longer use PHD pharmacy or orange card.  Encouraged smoking cessation.  5.  Tobacco use disorder:  discussed again if she is going to use the nicotine patches, she needs to use daily, not intermittently.  Would recommend getting partner to quit at same time and getting rid of all smoking paraphernalia to set up for success.   6.  Hyperglycemia:  A1C.    7.  Hypothyroidism:  TSH as above.  Last couple of TSH have been high.  Increase levothyroxine to 150 mcg.  Will repeat in 8 weeks.  Has been noncompliant with taking hormone in past.  8.  Poor hearing;  referral to audiology as now with coverage.    Clarified meds in med list.  Encouraged her to use her gabapentin as written rather than intermittently.

## 2022-06-23 LAB — COMPREHENSIVE METABOLIC PANEL
ALT: 10 IU/L (ref 0–32)
AST: 13 IU/L (ref 0–40)
Albumin/Globulin Ratio: 1.1 — ABNORMAL LOW (ref 1.2–2.2)
Albumin: 3.9 g/dL (ref 3.8–4.9)
Alkaline Phosphatase: 99 IU/L (ref 44–121)
BUN/Creatinine Ratio: 14 (ref 9–23)
BUN: 13 mg/dL (ref 6–24)
Bilirubin Total: 0.3 mg/dL (ref 0.0–1.2)
CO2: 30 mmol/L — ABNORMAL HIGH (ref 20–29)
Calcium: 9 mg/dL (ref 8.7–10.2)
Chloride: 103 mmol/L (ref 96–106)
Creatinine, Ser: 0.9 mg/dL (ref 0.57–1.00)
Globulin, Total: 3.4 g/dL (ref 1.5–4.5)
Glucose: 78 mg/dL (ref 70–99)
Potassium: 4.3 mmol/L (ref 3.5–5.2)
Sodium: 146 mmol/L — ABNORMAL HIGH (ref 134–144)
Total Protein: 7.3 g/dL (ref 6.0–8.5)
eGFR: 75 mL/min/{1.73_m2} (ref >59)

## 2022-06-23 LAB — IRON AND TIBC
Iron Saturation: 5 % — CL (ref 15–55)
Iron: 22 ug/dL — ABNORMAL LOW (ref 27–159)
Total Iron Binding Capacity: 413 ug/dL (ref 250–450)
UIBC: 391 ug/dL (ref 131–425)

## 2022-06-23 LAB — TSH: TSH: 5.69 u[IU]/mL — ABNORMAL HIGH (ref 0.450–4.500)

## 2022-06-23 LAB — LIPID PANEL W/O CHOL/HDL RATIO
Cholesterol, Total: 131 mg/dL (ref 100–199)
HDL: 30 mg/dL — ABNORMAL LOW (ref >39)
LDL Chol Calc (NIH): 75 mg/dL (ref 0–99)
Triglycerides: 150 mg/dL — ABNORMAL HIGH (ref 0–149)
VLDL Cholesterol Cal: 26 mg/dL (ref 5–40)

## 2022-06-23 LAB — VITAMIN B12: Vitamin B-12: 679 pg/mL (ref 232–1245)

## 2022-06-23 LAB — FOLATE RBC
Folate, Hemolysate: 341 ng/mL
Folate, RBC: 895 ng/mL (ref >498)
Hematocrit: 38.1 % (ref 34.0–46.6)

## 2022-06-23 LAB — BRAIN NATRIURETIC PEPTIDE

## 2022-06-23 LAB — HGB A1C W/O EAG: Hgb A1c MFr Bld: 5.9 % — ABNORMAL HIGH (ref 4.8–5.6)

## 2022-06-25 ENCOUNTER — Encounter: Payer: Self-pay | Admitting: Internal Medicine

## 2022-06-25 DIAGNOSIS — R030 Elevated blood-pressure reading, without diagnosis of hypertension: Secondary | ICD-10-CM | POA: Insufficient documentation

## 2022-06-25 MED ORDER — POLYSACCHARIDE IRON COMPLEX 150 MG PO CAPS: 150.0000 mg | ORAL_CAPSULE | Freq: Every day | ORAL | 4 refills | Status: AC

## 2022-06-25 NOTE — Addendum Note (Signed)
Addended by: Marcene Duos on: 06/25/2022 12:59 PM   Modules accepted: Orders

## 2022-06-28 ENCOUNTER — Ambulatory Visit: Payer: 59 | Attending: Audiology | Admitting: Audiology

## 2022-06-28 DIAGNOSIS — H903 Sensorineural hearing loss, bilateral: Secondary | ICD-10-CM | POA: Diagnosis present

## 2022-06-28 NOTE — Procedures (Signed)
Outpatient Audiology and [facility] [address] [place], [place]  [address] [phone]  AUDIOLOGICAL  EVALUATION  NAME: Meghan Contreras     DOB:   [birth year]      MRN: [id number]                                                                                     DATE: 06/28/2022     REFERENT: Julieanne Manson, MD STATUS: Outpatient DIAGNOSIS: Sensorineural hearing loss, bilateral   History: Meghan Contreras was seen for an audiological evaluation due to decreased hearing occurring for many years. Meghan Contreras reports increased difficulty hearing the television and hearing her phone ring. Meghan Contreras reports increased difficulty hearing and communicating in conversations with friends. Meghan Contreras denies otalgia, aural fullness, vertigo, and dizziness. Meghan Contreras has an extensive medical history.   Patient Active Problem List   Diagnosis Date Noted   Elevated BP without diagnosis of hypertension 06/25/2022   Ovarian torsion 02/11/2022   Pelvic mass 01/11/2022   Constipation 01/11/2022   COPD (chronic obstructive pulmonary disease) (HCC) 01/07/2022   Chronic hypoxic respiratory failure (HCC) 01/07/2022   AKI (acute kidney injury) (HCC) 01/07/2022   Bipolar disorder (HCC) 01/07/2022   PTSD (post-traumatic stress disorder) 01/07/2022   Normocytic anemia 01/07/2022   Hyperglycemia 01/07/2022   Opioid dependence (HCC) 01/07/2022   Hypothyroidism 01/07/2022   Ovarian mass 01/07/2022   Adnexal mass 01/07/2022   Left lower quadrant abdominal pain 01/07/2022   Anxiety state 01/07/2022   Smoker 01/07/2022   Umbilical hernia without obstruction or gangrene 01/07/2022   Panic attacks 12/28/2021   Tobacco dependence 10/07/2021   Nightmares associated with chronic post-traumatic stress disorder 09/02/2021   COPD (chronic obstructive pulmonary disease) with chronic bronchitis 09/02/2021   Elevated serum creatinine 01/10/2021   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) 01/10/2021    Decreased hearing 01/30/2020   Dental decay 01/30/2020   Opiate addiction (HCC)    Mixed hyperlipidemia    Hypothyroidism 02/11/2019   Tobacco abuse 02/11/2019   Depression 02/11/2019   PTSD (post-traumatic stress disorder) 02/11/2019   Stridor    Acute on chronic respiratory failure with hypoxemia (HCC) 08/04/2018   COPD with acute exacerbation (HCC) 08/04/2018   Hyperthyroidism 08/04/2018   Acute metabolic encephalopathy 08/04/2018    Evaluation:  Otoscopy showed a clear view of the tympanic membranes, bilaterally Tympanometry results were consistent with normal middle ear pressure and normal tympanic membrane mobility (Type A), bilaterally.  Audiometric testing was completed using Conventional Audiometry techniques with insert earphones and TDH headphones. Test results are consistent with a mild to moderately-severe sensorineural hearing loss, bilaterally. Speech Recognition Thresholds were obtained at 50 dB HL in the right ear and at 55  dB HL in the left ear. Word Recognition Testing was completed at 80 dB HL and [name] scored 84% in the right ear and 80% in the left ear.      Results:  The test results were reviewed with [facility] Today's results are consistent with a mild to moderately-severe sensorineural hearing loss, bilaterally. [name] will have communication difficulty in all listening environments. She will benefit from the use of good communication strategies and hearing aids. Tim was given a  list of hearing aid providers in the [place] area. Over-the-counter hearing aids were briefly reviewed.   Recommendations: 1.   Communication Needs Assessment with an Audiologist to discuss and pursue amplification.  2.   Continue to monitor hearing sensitivity    30 minutes spent testing and counseling on results.   If you have any questions please feel free to contact me at [phone].  Marton Redwood Audiologist, Au.D., CCC-A 06/28/2022  1:58 PM  Cc: Julieanne Manson, MD

## 2022-06-29 ENCOUNTER — Other Ambulatory Visit: Payer: 59

## 2022-07-16 DIAGNOSIS — R600 Localized edema: Secondary | ICD-10-CM | POA: Insufficient documentation

## 2022-07-16 DIAGNOSIS — R7989 Other specified abnormal findings of blood chemistry: Secondary | ICD-10-CM | POA: Insufficient documentation

## 2022-07-21 ENCOUNTER — Ambulatory Visit: Payer: 59 | Admitting: Internal Medicine

## 2022-07-27 ENCOUNTER — Ambulatory Visit: Payer: 59 | Attending: Cardiology | Admitting: Cardiology

## 2022-07-27 DIAGNOSIS — R7989 Other specified abnormal findings of blood chemistry: Secondary | ICD-10-CM

## 2022-07-27 DIAGNOSIS — R600 Localized edema: Secondary | ICD-10-CM

## 2022-07-28 ENCOUNTER — Encounter: Payer: Self-pay | Admitting: Cardiology

## 2022-08-13 ENCOUNTER — Ambulatory Visit: Payer: 59 | Admitting: Internal Medicine

## 2022-08-17 ENCOUNTER — Other Ambulatory Visit (HOSPITAL_COMMUNITY): Payer: Self-pay | Admitting: Psychiatry

## 2022-08-17 ENCOUNTER — Telehealth (HOSPITAL_COMMUNITY): Payer: Self-pay | Admitting: Psychiatry

## 2022-08-17 DIAGNOSIS — F17200 Nicotine dependence, unspecified, uncomplicated: Secondary | ICD-10-CM

## 2022-08-17 DIAGNOSIS — F411 Generalized anxiety disorder: Secondary | ICD-10-CM

## 2022-08-17 MED ORDER — NICOTINE 21 MG/24HR TD PT24: 21.0000 mg | MEDICATED_PATCH | Freq: Every day | TRANSDERMAL | 3 refills | Status: DC

## 2022-08-17 MED ORDER — QUETIAPINE FUMARATE 300 MG PO TABS: 300.0000 mg | ORAL_TABLET | Freq: Every day | ORAL | 3 refills | Status: DC

## 2022-08-17 MED ORDER — GABAPENTIN 100 MG PO CAPS: 100.0000 mg | ORAL_CAPSULE | Freq: Three times a day (TID) | ORAL | 3 refills | Status: DC

## 2022-08-17 MED ORDER — QUETIAPINE FUMARATE 100 MG PO TABS: ORAL_TABLET | ORAL | 3 refills | Status: DC

## 2022-08-17 MED ORDER — PRAZOSIN HCL 2 MG PO CAPS: 4.0000 mg | ORAL_CAPSULE | Freq: Every day | ORAL | 3 refills | Status: DC

## 2022-08-17 NOTE — Telephone Encounter (Signed)
Patient informed Clinical research associate that she has been without her Seroquel for a week.  She notes that she is more anxious and paranoid.  Patient notes that she dislikes being in the bathroom alone or with the door shot because she feels that something bad will happen.  She notes that she has been smoking more despite her COPD to cope with the above.  Today provider reordered Nicorette CQ 21 mg patches. Her other medications were refilled and sent to preferred pharmacy. Her appointment was rescheduled for July 18 at 11:30.  Today she denies SI/HI/AVH or mania.  Patient continues to be on Suboxone and notes that she follows up regularly for treatment.  No other concerns noted at this time.

## 2022-08-17 NOTE — Telephone Encounter (Signed)
Patient informed writer that she has been without her Seroquel for a week.  She notes that she is more anxious and paranoid.  Patient notes that she dislikes being in the bathroom alone or with the door shot because she feels that something bad will happen.  She notes that she has been smoking more despite her COPD to cope with the above.  Today provider reordered Nicorette CQ 21 mg patches. Her other medications were refilled and sent to preferred pharmacy. Her appointment was rescheduled for July 18 at 11:30.  Today she denies SI/HI/AVH or mania.  Patient continues to be on Suboxone and notes that she follows up regularly for treatment.  No other concerns noted at this time. 

## 2022-09-16 ENCOUNTER — Encounter (HOSPITAL_COMMUNITY): Payer: 59 | Admitting: Psychiatry

## 2022-09-29 ENCOUNTER — Ambulatory Visit: Payer: 59 | Admitting: Internal Medicine

## 2022-10-01 ENCOUNTER — Telehealth: Payer: Self-pay

## 2022-10-01 MED ORDER — NIRMATRELVIR/RITONAVIR (PAXLOVID)TABLET: 3.0000 | ORAL_TABLET | Freq: Two times a day (BID) | ORAL | 0 refills | Status: AC

## 2022-10-01 NOTE — Telephone Encounter (Signed)
Spoke with patient.   Her partner, Ladene Artist also ill with COvID and no physician. Called in dose for him. He denied renal impairment and only takes Tamsulosin She needs to hold her atorvastatin and hold her 300 mg dose of Seroquel May continue her 100 mg of Seroquel at night while taking Paxlovid.   Discussed side effects. To ED if increased dyspnea.

## 2022-10-01 NOTE — Telephone Encounter (Signed)
Patient has tested positive for COVID with a home test. Patient is experiencing shortness of breath, cough, wheezing and nausea. Patient would like medication sent to [facility] [address].

## 2022-10-15 ENCOUNTER — Encounter: Payer: Self-pay | Admitting: Internal Medicine

## 2022-11-27 ENCOUNTER — Inpatient Hospital Stay (HOSPITAL_COMMUNITY)
Admission: EM | Admit: 2022-11-27 | Discharge: 2022-12-07 | DRG: 004 | Disposition: A | Discharge status: Home Health | Payer: 59 | Attending: Internal Medicine | Admitting: Internal Medicine

## 2022-11-27 ENCOUNTER — Emergency Department (HOSPITAL_COMMUNITY): Payer: 59

## 2022-11-27 DIAGNOSIS — K5900 Constipation, unspecified: Secondary | ICD-10-CM | POA: Diagnosis present

## 2022-11-27 DIAGNOSIS — F1120 Opioid dependence, uncomplicated: Secondary | ICD-10-CM | POA: Diagnosis present

## 2022-11-27 DIAGNOSIS — J9601 Acute respiratory failure with hypoxia: Secondary | ICD-10-CM | POA: Diagnosis present

## 2022-11-27 DIAGNOSIS — E669 Obesity, unspecified: Secondary | ICD-10-CM | POA: Diagnosis present

## 2022-11-27 DIAGNOSIS — Z5986 Financial insecurity: Secondary | ICD-10-CM

## 2022-11-27 DIAGNOSIS — J3801 Paralysis of vocal cords and larynx, unilateral: Secondary | ICD-10-CM | POA: Diagnosis present

## 2022-11-27 DIAGNOSIS — H9190 Unspecified hearing loss, unspecified ear: Secondary | ICD-10-CM | POA: Diagnosis present

## 2022-11-27 DIAGNOSIS — F4310 Post-traumatic stress disorder, unspecified: Secondary | ICD-10-CM | POA: Diagnosis present

## 2022-11-27 DIAGNOSIS — Z79899 Other long term (current) drug therapy: Secondary | ICD-10-CM | POA: Diagnosis not present

## 2022-11-27 DIAGNOSIS — E876 Hypokalemia: Secondary | ICD-10-CM | POA: Diagnosis not present

## 2022-11-27 DIAGNOSIS — J189 Pneumonia, unspecified organism: Secondary | ICD-10-CM | POA: Diagnosis present

## 2022-11-27 DIAGNOSIS — F319 Bipolar disorder, unspecified: Secondary | ICD-10-CM | POA: Diagnosis present

## 2022-11-27 DIAGNOSIS — F17210 Nicotine dependence, cigarettes, uncomplicated: Secondary | ICD-10-CM | POA: Diagnosis present

## 2022-11-27 DIAGNOSIS — J9602 Acute respiratory failure with hypercapnia: Principal | ICD-10-CM | POA: Diagnosis present

## 2022-11-27 DIAGNOSIS — J441 Chronic obstructive pulmonary disease with (acute) exacerbation: Secondary | ICD-10-CM | POA: Diagnosis present

## 2022-11-27 DIAGNOSIS — G8929 Other chronic pain: Secondary | ICD-10-CM | POA: Diagnosis present

## 2022-11-27 DIAGNOSIS — Z635 Disruption of family by separation and divorce: Secondary | ICD-10-CM

## 2022-11-27 DIAGNOSIS — Z813 Family history of other psychoactive substance abuse and dependence: Secondary | ICD-10-CM

## 2022-11-27 DIAGNOSIS — E890 Postprocedural hypothyroidism: Secondary | ICD-10-CM | POA: Diagnosis present

## 2022-11-27 DIAGNOSIS — E785 Hyperlipidemia, unspecified: Secondary | ICD-10-CM | POA: Diagnosis present

## 2022-11-27 DIAGNOSIS — J440 Chronic obstructive pulmonary disease with acute lower respiratory infection: Secondary | ICD-10-CM | POA: Diagnosis present

## 2022-11-27 DIAGNOSIS — Z6835 Body mass index (BMI) 35.0-35.9, adult: Secondary | ICD-10-CM | POA: Diagnosis not present

## 2022-11-27 DIAGNOSIS — Z1152 Encounter for screening for COVID-19: Secondary | ICD-10-CM

## 2022-11-27 DIAGNOSIS — R739 Hyperglycemia, unspecified: Secondary | ICD-10-CM | POA: Diagnosis present

## 2022-11-27 DIAGNOSIS — Z9911 Dependence on respirator [ventilator] status: Secondary | ICD-10-CM | POA: Diagnosis not present

## 2022-11-27 DIAGNOSIS — Z930 Tracheostomy status: Secondary | ICD-10-CM | POA: Diagnosis not present

## 2022-11-27 DIAGNOSIS — Z880 Allergy status to penicillin: Secondary | ICD-10-CM

## 2022-11-27 DIAGNOSIS — Z830 Family history of human immunodeficiency virus [HIV] disease: Secondary | ICD-10-CM

## 2022-11-27 DIAGNOSIS — Z79890 Hormone replacement therapy (postmenopausal): Secondary | ICD-10-CM | POA: Diagnosis not present

## 2022-11-27 DIAGNOSIS — J9600 Acute respiratory failure, unspecified whether with hypoxia or hypercapnia: Secondary | ICD-10-CM | POA: Diagnosis not present

## 2022-11-27 DIAGNOSIS — Z91040 Latex allergy status: Secondary | ICD-10-CM

## 2022-11-27 HISTORY — DX: Prediabetes: R73.03

## 2022-11-27 LAB — URINALYSIS, W/ REFLEX TO CULTURE (INFECTION SUSPECTED)
Bacteria, UA: NONE SEEN
Bilirubin Urine: NEGATIVE
Glucose, UA: NEGATIVE mg/dL
Hgb urine dipstick: NEGATIVE
Ketones, ur: NEGATIVE mg/dL
Leukocytes,Ua: NEGATIVE
Nitrite: NEGATIVE
Protein, ur: NEGATIVE mg/dL
Specific Gravity, Urine: 1.03 (ref 1.005–1.030)
pH: 7 (ref 5.0–8.0)

## 2022-11-27 LAB — I-STAT ARTERIAL BLOOD GAS, ED
Acid-Base Excess: 3 mmol/L — ABNORMAL HIGH (ref 0.0–2.0)
Acid-Base Excess: 1 mmol/L (ref 0.0–2.0)
Bicarbonate: 32.8 mmol/L — ABNORMAL HIGH (ref 20.0–28.0)
Bicarbonate: 27.6 mmol/L (ref 20.0–28.0)
Calcium, Ion: 1.15 mmol/L (ref 1.15–1.40)
Calcium, Ion: 1.11 mmol/L — ABNORMAL LOW (ref 1.15–1.40)
HCT: 38 % (ref 36.0–46.0)
HCT: 42 % (ref 36.0–46.0)
Hemoglobin: 12.9 g/dL (ref 12.0–15.0)
Hemoglobin: 14.3 g/dL (ref 12.0–15.0)
O2 Saturation: 100 %
O2 Saturation: 88 %
Patient temperature: 96.1
Patient temperature: 96.4
Potassium: 4 mmol/L (ref 3.5–5.1)
Potassium: 4.1 mmol/L (ref 3.5–5.1)
Sodium: 142 mmol/L (ref 135–145)
Sodium: 140 mmol/L (ref 135–145)
TCO2: 35 mmol/L — ABNORMAL HIGH (ref 22–32)
TCO2: 29 mmol/L (ref 22–32)
pCO2 arterial: 70.2 mm[Hg] (ref 32–48)
pCO2 arterial: 45.8 mm[Hg] (ref 32–48)
pH, Arterial: 7.271 — ABNORMAL LOW (ref 7.35–7.45)
pH, Arterial: 7.383 (ref 7.35–7.45)
pO2, Arterial: 265 mm[Hg] — ABNORMAL HIGH (ref 83–108)
pO2, Arterial: 52 mm[Hg] — ABNORMAL LOW (ref 83–108)

## 2022-11-27 LAB — COMPREHENSIVE METABOLIC PANEL
ALT: 18 U/L (ref 0–44)
AST: 23 U/L (ref 15–41)
Albumin: 3.5 g/dL (ref 3.5–5.0)
Alkaline Phosphatase: 67 U/L (ref 38–126)
Anion gap: 7 (ref 5–15)
BUN: 22 mg/dL — ABNORMAL HIGH (ref 6–20)
CO2: 32 mmol/L (ref 22–32)
Calcium: 8.6 mg/dL — ABNORMAL LOW (ref 8.9–10.3)
Chloride: 100 mmol/L (ref 98–111)
Creatinine, Ser: 1 mg/dL (ref 0.44–1.00)
GFR, Estimated: >60 mL/min (ref >60)
Glucose, Bld: 142 mg/dL — ABNORMAL HIGH (ref 70–99)
Potassium: 4.4 mmol/L (ref 3.5–5.1)
Sodium: 139 mmol/L (ref 135–145)
Total Bilirubin: 0.8 mg/dL (ref 0.3–1.2)
Total Protein: 7.5 g/dL (ref 6.5–8.1)

## 2022-11-27 LAB — POCT I-STAT 7, (LYTES, BLD GAS, ICA,H+H)
Acid-Base Excess: 3 mmol/L — ABNORMAL HIGH (ref 0.0–2.0)
Bicarbonate: 27.3 mmol/L (ref 20.0–28.0)
Calcium, Ion: 1.11 mmol/L — ABNORMAL LOW (ref 1.15–1.40)
HCT: 41 % (ref 36.0–46.0)
Hemoglobin: 13.9 g/dL (ref 12.0–15.0)
O2 Saturation: 94 %
Patient temperature: 99.3
Potassium: 4 mmol/L (ref 3.5–5.1)
Sodium: 139 mmol/L (ref 135–145)
TCO2: 28 mmol/L (ref 22–32)
pCO2 arterial: 39 mm[Hg] (ref 32–48)
pH, Arterial: 7.455 — ABNORMAL HIGH (ref 7.35–7.45)
pO2, Arterial: 70 mm[Hg] — ABNORMAL LOW (ref 83–108)

## 2022-11-27 LAB — CBC WITH DIFFERENTIAL/PLATELET
Abs Immature Granulocytes: 0 10*3/uL (ref 0.00–0.07)
Basophils Absolute: 0.1 10*3/uL (ref 0.0–0.1)
Basophils Relative: 1 %
Eosinophils Absolute: 0 10*3/uL (ref 0.0–0.5)
Eosinophils Relative: 0 %
HCT: 43.1 % (ref 36.0–46.0)
Hemoglobin: 12.6 g/dL (ref 12.0–15.0)
Lymphocytes Relative: 32 %
Lymphs Abs: 2.3 10*3/uL (ref 0.7–4.0)
MCH: 24.5 pg — ABNORMAL LOW (ref 26.0–34.0)
MCHC: 29.2 g/dL — ABNORMAL LOW (ref 30.0–36.0)
MCV: 83.7 fL (ref 80.0–100.0)
Monocytes Absolute: 0.2 10*3/uL (ref 0.1–1.0)
Monocytes Relative: 3 %
Neutro Abs: 4.6 10*3/uL (ref 1.7–7.7)
Neutrophils Relative %: 64 %
Platelets: 244 10*3/uL (ref 150–400)
RBC: 5.15 MIL/uL — ABNORMAL HIGH (ref 3.87–5.11)
RDW: 21.1 % — ABNORMAL HIGH (ref 11.5–15.5)
WBC: 7.2 10*3/uL (ref 4.0–10.5)
nRBC: 0.6 % — ABNORMAL HIGH (ref 0.0–0.2)
nRBC: 1 /100{WBCs} — ABNORMAL HIGH

## 2022-11-27 LAB — CBC
HCT: 44.4 % (ref 36.0–46.0)
Hemoglobin: 13.5 g/dL (ref 12.0–15.0)
MCH: 25.3 pg — ABNORMAL LOW (ref 26.0–34.0)
MCHC: 30.4 g/dL (ref 30.0–36.0)
MCV: 83.3 fL (ref 80.0–100.0)
Platelets: 237 10*3/uL (ref 150–400)
RBC: 5.33 MIL/uL — ABNORMAL HIGH (ref 3.87–5.11)
RDW: 20.9 % — ABNORMAL HIGH (ref 11.5–15.5)
WBC: 8.5 10*3/uL (ref 4.0–10.5)
nRBC: 0 % (ref 0.0–0.2)

## 2022-11-27 LAB — CBG MONITORING, ED
Glucose-Capillary: 132 mg/dL — ABNORMAL HIGH (ref 70–99)
Glucose-Capillary: 112 mg/dL — ABNORMAL HIGH (ref 70–99)

## 2022-11-27 LAB — BASIC METABOLIC PANEL
Anion gap: 8 (ref 5–15)
BUN: 20 mg/dL (ref 6–20)
CO2: 28 mmol/L (ref 22–32)
Calcium: 7.9 mg/dL — ABNORMAL LOW (ref 8.9–10.3)
Chloride: 100 mmol/L (ref 98–111)
Creatinine, Ser: 0.93 mg/dL (ref 0.44–1.00)
GFR, Estimated: >60 mL/min (ref >60)
Glucose, Bld: 146 mg/dL — ABNORMAL HIGH (ref 70–99)
Potassium: 4.1 mmol/L (ref 3.5–5.1)
Sodium: 136 mmol/L (ref 135–145)

## 2022-11-27 LAB — PROTIME-INR
INR: 1.1 (ref 0.8–1.2)
Prothrombin Time: 14.1 s (ref 11.4–15.2)

## 2022-11-27 LAB — I-STAT VENOUS BLOOD GAS, ED
Acid-Base Excess: 3 mmol/L — ABNORMAL HIGH (ref 0.0–2.0)
Bicarbonate: 35.3 mmol/L — ABNORMAL HIGH (ref 20.0–28.0)
Calcium, Ion: 1.16 mmol/L (ref 1.15–1.40)
HCT: 44 % (ref 36.0–46.0)
Hemoglobin: 15 g/dL (ref 12.0–15.0)
O2 Saturation: 91 %
Potassium: 4.3 mmol/L (ref 3.5–5.1)
Sodium: 141 mmol/L (ref 135–145)
TCO2: 38 mmol/L — ABNORMAL HIGH (ref 22–32)
pCO2, Ven: 97.8 mm[Hg] (ref 44–60)
pH, Ven: 7.165 — CL (ref 7.25–7.43)
pO2, Ven: 81 mm[Hg] — ABNORMAL HIGH (ref 32–45)

## 2022-11-27 LAB — RAPID URINE DRUG SCREEN, HOSP PERFORMED
Amphetamines: NOT DETECTED
Barbiturates: NOT DETECTED
Benzodiazepines: NOT DETECTED
Cocaine: NOT DETECTED
Opiates: NOT DETECTED
Tetrahydrocannabinol: NOT DETECTED

## 2022-11-27 LAB — TROPONIN I (HIGH SENSITIVITY)
Troponin I (High Sensitivity): 14 ng/L (ref <18)
Troponin I (High Sensitivity): 16 ng/L (ref <18)

## 2022-11-27 LAB — RESP PANEL BY RT-PCR (RSV, FLU A&B, COVID)  RVPGX2
Influenza A by PCR: NEGATIVE
Influenza B by PCR: NEGATIVE
Resp Syncytial Virus by PCR: NEGATIVE
SARS Coronavirus 2 by RT PCR: NEGATIVE

## 2022-11-27 LAB — I-STAT CG4 LACTIC ACID, ED: Lactic Acid, Venous: 0.6 mmol/L (ref 0.5–1.9)

## 2022-11-27 LAB — GLUCOSE, CAPILLARY
Glucose-Capillary: 108 mg/dL — ABNORMAL HIGH (ref 70–99)
Glucose-Capillary: 97 mg/dL (ref 70–99)
Glucose-Capillary: 128 mg/dL — ABNORMAL HIGH (ref 70–99)
Glucose-Capillary: 133 mg/dL — ABNORMAL HIGH (ref 70–99)

## 2022-11-27 LAB — MRSA NEXT GEN BY PCR, NASAL: MRSA by PCR Next Gen: NOT DETECTED

## 2022-11-27 LAB — APTT: aPTT: 31 s (ref 24–36)

## 2022-11-27 LAB — BRAIN NATRIURETIC PEPTIDE: B Natriuretic Peptide: 525.6 pg/mL — ABNORMAL HIGH (ref 0.0–100.0)

## 2022-11-27 MED ORDER — LACTATED RINGERS IV BOLUS
1000.0000 mL | Freq: Once | INTRAVENOUS | Status: AC
  Administered 2022-11-27: 1000 mL via INTRAVENOUS

## 2022-11-27 MED ORDER — FENTANYL BOLUS VIA INFUSION: 50.0000 ug | INTRAVENOUS | Status: DC | PRN

## 2022-11-27 MED ORDER — POLYETHYLENE GLYCOL 3350 17 G PO PACK
17.0000 g | PACK | Freq: Every day | ORAL | Status: DC
  Administered 2022-11-27: 17 g
  Filled 2022-11-27: qty 1

## 2022-11-27 MED ORDER — LEVOFLOXACIN IN D5W 500 MG/100ML IV SOLN
500.0000 mg | INTRAVENOUS | Status: DC
  Administered 2022-11-27: 500 mg via INTRAVENOUS
  Filled 2022-11-27 (×2): qty 100

## 2022-11-27 MED ORDER — FENTANYL 2500MCG IN NS 250ML (10MCG/ML) PREMIX INFUSION
0.0000 ug/h | INTRAVENOUS | Status: DC
  Administered 2022-11-27: 25 ug/h via INTRAVENOUS
  Filled 2022-11-27: qty 250

## 2022-11-27 MED ORDER — SODIUM CHLORIDE 0.9 % IV SOLN
2.0000 g | Freq: Once | INTRAVENOUS | Status: AC
  Administered 2022-11-27: 2 g via INTRAVENOUS
  Filled 2022-11-27: qty 20

## 2022-11-27 MED ORDER — DOCUSATE SODIUM 50 MG/5ML PO LIQD
100.0000 mg | Freq: Two times a day (BID) | ORAL | Status: DC
  Administered 2022-11-27 (×2): 100 mg
  Filled 2022-11-27 (×2): qty 10

## 2022-11-27 MED ORDER — VITAL AF 1.2 CAL PO LIQD
1000.0000 mL | ORAL | Status: DC
  Administered 2022-11-27: 1000 mL

## 2022-11-27 MED ORDER — METHYLPREDNISOLONE SODIUM SUCC 40 MG IJ SOLR
40.0000 mg | Freq: Two times a day (BID) | INTRAMUSCULAR | Status: DC
  Administered 2022-11-27 – 2022-11-28 (×3): 40 mg via INTRAVENOUS
  Filled 2022-11-27 (×3): qty 1

## 2022-11-27 MED ORDER — PROPOFOL 1000 MG/100ML IV EMUL
INTRAVENOUS | Status: AC
  Filled 2022-11-27: qty 100

## 2022-11-27 MED ORDER — ETOMIDATE 2 MG/ML IV SOLN
INTRAVENOUS | Status: AC
  Administered 2022-11-27: 20 mg via INTRAVENOUS
  Filled 2022-11-27: qty 10

## 2022-11-27 MED ORDER — ORAL CARE MOUTH RINSE
15.0000 mL | OROMUCOSAL | Status: DC
  Administered 2022-11-27 – 2022-11-29 (×25): 15 mL via OROMUCOSAL

## 2022-11-27 MED ORDER — SODIUM CHLORIDE 0.9 % IV SOLN: INTRAVENOUS | Status: DC | PRN

## 2022-11-27 MED ORDER — ETOMIDATE 2 MG/ML IV SOLN: 20.0000 mg | Freq: Once | INTRAVENOUS | Status: AC

## 2022-11-27 MED ORDER — FENTANYL 2500MCG IN NS 250ML (10MCG/ML) PREMIX INFUSION
0.0000 ug/h | INTRAVENOUS | Status: DC
  Administered 2022-11-27: 200 ug/h via INTRAVENOUS
  Administered 2022-11-27: 50 ug/h via INTRAVENOUS
  Administered 2022-11-27: 200 ug/h via INTRAVENOUS
  Filled 2022-11-27 (×2): qty 250

## 2022-11-27 MED ORDER — SODIUM CHLORIDE 0.9 % IV BOLUS
1000.0000 mL | Freq: Once | INTRAVENOUS | Status: AC
  Administered 2022-11-27: 1000 mL via INTRAVENOUS

## 2022-11-27 MED ORDER — FAMOTIDINE 20 MG PO TABS
20.0000 mg | ORAL_TABLET | Freq: Two times a day (BID) | ORAL | Status: DC
  Administered 2022-11-27 (×2): 20 mg
  Filled 2022-11-27 (×2): qty 1

## 2022-11-27 MED ORDER — LEVOTHYROXINE SODIUM 137 MCG PO TABS
137.0000 ug | ORAL_TABLET | Freq: Every day | ORAL | Status: DC
  Administered 2022-11-27 – 2022-12-01 (×3): 137 ug
  Filled 2022-11-27 (×5): qty 1

## 2022-11-27 MED ORDER — ARFORMOTEROL TARTRATE 15 MCG/2ML IN NEBU
15.0000 ug | INHALATION_SOLUTION | Freq: Two times a day (BID) | RESPIRATORY_TRACT | Status: DC
  Administered 2022-11-27 – 2022-12-07 (×21): 15 ug via RESPIRATORY_TRACT
  Filled 2022-11-27 (×21): qty 2

## 2022-11-27 MED ORDER — LACTATED RINGERS IV SOLN: INTRAVENOUS | Status: DC

## 2022-11-27 MED ORDER — LEVOFLOXACIN IN D5W 750 MG/150ML IV SOLN
750.0000 mg | INTRAVENOUS | Status: DC
  Administered 2022-11-28 – 2022-12-03 (×6): 750 mg via INTRAVENOUS
  Filled 2022-11-27 (×8): qty 150

## 2022-11-27 MED ORDER — PROPOFOL 1000 MG/100ML IV EMUL
0.0000 ug/kg/min | INTRAVENOUS | Status: DC
  Administered 2022-11-27: 50 ug/kg/min via INTRAVENOUS
  Administered 2022-11-27: 40 ug/kg/min via INTRAVENOUS
  Administered 2022-11-27: 50 ug/kg/min via INTRAVENOUS
  Administered 2022-11-27: 5 ug/kg/min via INTRAVENOUS
  Administered 2022-11-27: 50 ug/kg/min via INTRAVENOUS
  Administered 2022-11-27: 39.928 ug/kg/min via INTRAVENOUS
  Administered 2022-11-27 – 2022-11-28 (×2): 40 ug/kg/min via INTRAVENOUS
  Administered 2022-11-28: 20 ug/kg/min via INTRAVENOUS
  Filled 2022-11-27 (×2): qty 100
  Filled 2022-11-27 (×2): qty 200

## 2022-11-27 MED ORDER — VITAL HIGH PROTEIN PO LIQD
1000.0000 mL | ORAL | Status: DC
  Filled 2022-11-27: qty 1000

## 2022-11-27 MED ORDER — CHLORHEXIDINE GLUCONATE CLOTH 2 % EX PADS
6.0000 | MEDICATED_PAD | Freq: Every day | CUTANEOUS | Status: DC
  Administered 2022-11-27 – 2022-12-03 (×8): 6 via TOPICAL

## 2022-11-27 MED ORDER — ENOXAPARIN SODIUM 40 MG/0.4ML IJ SOSY
40.0000 mg | PREFILLED_SYRINGE | INTRAMUSCULAR | Status: DC
  Administered 2022-11-27 – 2022-12-07 (×10): 40 mg via SUBCUTANEOUS
  Filled 2022-11-27 (×10): qty 0.4

## 2022-11-27 MED ORDER — BUDESONIDE 0.5 MG/2ML IN SUSP
0.5000 mg | Freq: Two times a day (BID) | RESPIRATORY_TRACT | Status: DC
  Administered 2022-11-27 – 2022-12-07 (×21): 0.5 mg via RESPIRATORY_TRACT
  Filled 2022-11-27 (×21): qty 2

## 2022-11-27 MED ORDER — REVEFENACIN 175 MCG/3ML IN SOLN
175.0000 ug | Freq: Every day | RESPIRATORY_TRACT | Status: DC
  Administered 2022-11-27 – 2022-12-07 (×11): 175 ug via RESPIRATORY_TRACT
  Filled 2022-11-27 (×11): qty 3

## 2022-11-27 MED ORDER — ORAL CARE MOUTH RINSE
15.0000 mL | OROMUCOSAL | Status: DC | PRN
  Administered 2022-11-27: 15 mL via OROMUCOSAL

## 2022-11-27 MED ORDER — ROCURONIUM BROMIDE 10 MG/ML (PF) SYRINGE
100.0000 mg | PREFILLED_SYRINGE | Freq: Once | INTRAVENOUS | Status: AC
  Administered 2022-11-27: 100 mg via INTRAVENOUS

## 2022-11-27 MED ORDER — ALBUTEROL SULFATE (2.5 MG/3ML) 0.083% IN NEBU
2.5000 mg | INHALATION_SOLUTION | RESPIRATORY_TRACT | Status: DC | PRN
  Administered 2022-11-27: 2.5 mg via RESPIRATORY_TRACT
  Filled 2022-11-27: qty 3

## 2022-11-27 MED ORDER — PROPOFOL 1000 MG/100ML IV EMUL: 5.0000 ug/kg/min | INTRAVENOUS | Status: DC

## 2022-11-27 MED ORDER — NICOTINE 21 MG/24HR TD PT24
21.0000 mg | MEDICATED_PATCH | Freq: Every day | TRANSDERMAL | Status: DC
  Administered 2022-11-27 – 2022-12-07 (×11): 21 mg via TRANSDERMAL
  Filled 2022-11-27 (×11): qty 1

## 2022-11-27 MED ORDER — CALCIUM GLUCONATE-NACL 2-0.675 GM/100ML-% IV SOLN
2.0000 g | Freq: Once | INTRAVENOUS | Status: AC
  Administered 2022-11-27: 2000 mg via INTRAVENOUS
  Filled 2022-11-27: qty 100

## 2022-11-27 MED ORDER — IOHEXOL 350 MG/ML SOLN
75.0000 mL | Freq: Once | INTRAVENOUS | Status: AC | PRN
  Administered 2022-11-27: 75 mL via INTRAVENOUS

## 2022-11-27 MED ORDER — IPRATROPIUM-ALBUTEROL 0.5-2.5 (3) MG/3ML IN SOLN
RESPIRATORY_TRACT | Status: AC
  Filled 2022-11-27: qty 3

## 2022-11-27 MED ORDER — IPRATROPIUM-ALBUTEROL 0.5-2.5 (3) MG/3ML IN SOLN: 3.0000 mL | RESPIRATORY_TRACT | Status: DC

## 2022-11-27 MED ORDER — SODIUM CHLORIDE 0.9 % IV SOLN
500.0000 mg | Freq: Once | INTRAVENOUS | Status: AC
  Administered 2022-11-27: 500 mg via INTRAVENOUS
  Filled 2022-11-27: qty 5

## 2022-11-27 NOTE — Progress Notes (Addendum)
Initial Nutrition Assessment  INTERVENTION:   Trickle tube feeds initiated via OGT -Vital AF 1.2 @ 20 ml/hr  -Advancement recommendations: advance by 10 ml every 4 hours to goal rate of 60 ml/hr. -Provides 1728 kcals, 108g protein and 1167 ml H2O  NUTRITION DIAGNOSIS:   Inadequate oral intake related to inability to eat as evidenced by NPO status.  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 Admitted with respiratory distress.  Patient intubated in ED this morning d/t respiratory distress. Pt with increasing propofol sedation as well.  Per MD, start trickle tube feeds today and provide advancement recommendations which are above.  Patient is currently intubated on ventilator support MV: 8.7 L/min Temp (24hrs), Avg:97.4 F (36.3 C), Min:96.2 F (35.7 C), Max:99.1 F (37.3 C)  Propofol: 25.2 ml/hr -provides ~665 fat kcals over 24 hrs  No notable weight changes per weight records.  Medications: Colace, Pepcid, Prednisone, Miralax, calcium gluconate, Fentanyl  Labs reviewed: CBGs: 108-132   NUTRITION - FOCUSED PHYSICAL EXAM:  Unable to complete, working remotely.  Diet Order:   Diet Order             Diet NPO time specified  Diet effective now                   EDUCATION NEEDS:   Not appropriate for education at this time  Skin:  Skin Assessment: Reviewed RN Assessment  Last BM:  PTA  Height:   Ht Readings from Last 1 Encounters:  11/27/22 5\' 1"  (1.549 m)    Weight:   Wt Readings from Last 1 Encounters:  11/27/22 83.9 kg    BMI:  Body mass index is 34.96 kg/m.  Estimated  Nutritional Needs:   Kcal:  1600-1800  Protein:  95-105g  Fluid:  1.8L/day   Tilda Franco, MS, RD, LDN Inpatient Clinical Dietitian Contact information available via Amion

## 2022-11-27 NOTE — Progress Notes (Signed)
PCCM progress note  Please see H&P for full details but patient admitted overnight for acute hypoxic and hypercapnic respiratory failure resulting in need for intubation.  No changes to plan currently.  Awaiting ICU bed   Shakenya Stoneberg D. Harris, NP-C [facility] Personal contact information can be found on Amion  If no contact or response made please call [phone] 11/27/2022, 8:27 AM

## 2022-11-27 NOTE — Progress Notes (Signed)
Transported pt from ICU to CT via vent with no incident

## 2022-11-27 NOTE — ED Provider Notes (Signed)
[facility] Emergency Department Provider Note MRN:  [id number]  Arrival date & time: 11/27/22     Chief Complaint   Respiratory Distress   History of Present Illness   Meghan Contreras is a 57 y.o. year-old female with a history of COPD presenting to the ED with chief complaint of respiratory distress.  Reportedly patient in severe respiratory distress with saturations in the 50s.  Began to worsen and lose her mental status and route, incomplete respiratory failure on arrival.  Review of Systems  A thorough review of systems was obtained and all systems are negative except as noted in the HPI and PMH.   Patient's Health History    Past Medical History:  Diagnosis Date   Anemia    Anxiety    Bipolar disorder (HCC)    COPD (chronic obstructive pulmonary disease) (HCC)    Depression    Elevated brain natriuretic peptide (BNP) level    Elevated serum creatinine    HOH (hard of hearing)    Hyperglycemia    Hyperlipidemia 2021   Hyperthyroidism    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  Old records from [facility] with CT of soft tissue of chest and neck 11/2017:  No findings to suggest cause of stridorous breathing and no obvious lung disease.     Tobacco dependence     Past Surgical History:  Procedure Laterality Date   ROBOTIC ASSISTED BILATERAL SALPINGO OOPHERECTOMY N/A 01/08/2022   Procedure: XI ROBOTIC ASSISTED BILATERAL SALPINGO OOPHORECTOMY;  Surgeon: Carver Fila, MD;  Location: [facility]S;  Service: Gynecology;  Laterality: N/A;   THYROIDECTOMY  2019    Family History  Problem Relation Age of Onset   HIV/AIDS Mother        from drug abuse   Drug abuse Mother    Drug abuse Brother    Hepatitis C  Brother        From IV drug abuse   Drug abuse Sister    Drug abuse Sister    Drug abuse Sister    Drug abuse Sister    Drug abuse Sister    Cancer Sister        lung   Drug abuse Sister     Social History   Socioeconomic History   Marital status: Legally Separated    Spouse name: Not on file   Number of children: 2   Years of education: Not on file   Highest education level: Some college, no degree  Occupational History   Occupation: unemployed    Comment: Supported by daughter, ex husband and boyfriend.  Tobacco Use   Smoking status: Every Day    Current packs/day: 0.25    Average packs/day: 0.3 packs/day for 42.0 years (10.5 ttl pk-yrs)    Types: Cigarettes    Passive exposure: Current   Smokeless tobacco: Never   Tobacco comments:    Taking patch off at night and then smokes after 9 p.m.  Vaping Use   Vaping status: Never Used  Substance and Sexual Activity   Alcohol use: No   Drug use: Not Currently    Comment: Benzodiazepines / Klonopin/opiate addiction.  Also history of crack cocaine abuse.  2021  clean for 2017 on Suboxone.    Sexual activity: Not Currently  Other Topics Concern   Not on file  Social History Narrative    Lives alone, though her boyfriend will visit for 2 months at a time.   He lives with his daughter in [place] otherwise.   Social Determinants of Health   Financial Resource Strain: Medium Risk (02/11/2022)   Overall Financial Resource Strain (CARDIA)    Difficulty of Paying Living Expenses: Somewhat hard  Food Insecurity: No Food Insecurity (02/11/2022)   Hunger Vital Sign    Worried About Running Out of Food in the Last Year: Never true    Ran Out of Food in the Last Year: Never true  Transportation Needs: No Transportation Needs (02/11/2022)   PRAPARE - Administrator, Civil Service (Medical): No    Lack of Transportation (Non-Medical): No  Physical Activity: Not on file  Stress: Not on file  Social Connections: Not on file   Intimate Partner Violence: Not At Risk (02/11/2022)   Humiliation, Afraid, Rape, and Kick questionnaire    Fear of Current or Ex-Partner: No    Emotionally Abused: No    Physically Abused: No    Sexually Abused: No     Physical Exam   Vitals:   11/27/22 0630 11/27/22 0645  BP: (!) 143/93 137/89  Pulse: 71 69  Resp: (!) 22 (!) 22  Temp:    SpO2: 100% 100%    CONSTITUTIONAL: Ill-appearing NEURO/PSYCH: Unresponsive EYES:  eyes equal and reactive ENT/NECK:  no LAD, no JVD CARDIO: Tachycardic rate, not well-perfused, faint peripheral pulses PULM: Very poor air movement, not breathing on her own GI/GU:  non-distended, non-tender MSK/SPINE:  No gross deformities, no edema SKIN:  no rash, atraumatic   *Additional and/or pertinent findings included in MDM below  Diagnostic and Interventional Summary    EKG Interpretation Date/Time:  Saturday November 27 2022 00:42:41 EDT Ventricular Rate:  109 PR Interval:  158 QRS Duration:  81 QT Interval:  336 QTC Calculation: 453 R Axis:   206  Text Interpretation: Sinus tachycardia Probable left atrial enlargement Right axis deviation Confirmed by Kennis Carina [phone]) on 11/27/2022 12:50:13 AM       Labs Reviewed  COMPREHENSIVE METABOLIC PANEL - Abnormal; Notable for the following components:      Result Value   Glucose, Bld 142 (*)    BUN 22 (*)    Calcium 8.6 (*)    All other components within normal limits  CBC WITH DIFFERENTIAL/PLATELET - Abnormal; Notable for the following components:   RBC 5.15 (*)    MCH 24.5 (*)    MCHC 29.2 (*)    RDW 21.1 (*)    nRBC 0.6 (*)    nRBC 1 (*)    All other components within normal limits  URINALYSIS, W/ REFLEX TO CULTURE (INFECTION SUSPECTED) - Abnormal; Notable for the following components:   Color, Urine STRAW (*)    All other components within normal limits  BRAIN NATRIURETIC PEPTIDE - Abnormal; Notable for the following components:   B Natriuretic Peptide 525.6 (*)    All  other components within normal limits  CBC - Abnormal; Notable for the following components:   RBC 5.33 (*)    MCH 25.3 (*)    RDW 20.9 (*)    All other components within normal limits  BASIC METABOLIC PANEL - Abnormal; Notable for the following components:   Glucose, Bld 146 (*)    Calcium 7.9 (*)  All other components within normal limits  I-STAT ARTERIAL BLOOD GAS, ED - Abnormal; Notable for the following components:   pH, Arterial 7.271 (*)    pCO2 arterial 70.2 (*)    pO2, Arterial 265 (*)    Bicarbonate 32.8 (*)    TCO2 35 (*)    Acid-Base Excess 3.0 (*)    All other components within normal limits  I-STAT VENOUS BLOOD GAS, ED - Abnormal; Notable for the following components:   pH, Ven 7.165 (*)    pCO2, Ven 97.8 (*)    pO2, Ven 81 (*)    Bicarbonate 35.3 (*)    TCO2 38 (*)    Acid-Base Excess 3.0 (*)    All other components within normal limits  I-STAT ARTERIAL BLOOD GAS, ED - Abnormal; Notable for the following components:   pO2, Arterial 52 (*)    Calcium, Ion 1.11 (*)    All other components within normal limits  CBG MONITORING, ED - Abnormal; Notable for the following components:   Glucose-Capillary 132 (*)    All other components within normal limits  RESP PANEL BY RT-PCR (RSV, FLU A&B, COVID)  RVPGX2  MRSA NEXT GEN BY PCR, NASAL  CULTURE, BLOOD (ROUTINE X 2)  CULTURE, BLOOD (ROUTINE X 2)  CULTURE, RESPIRATORY W GRAM STAIN  PROTIME-INR  APTT  RAPID URINE DRUG SCREEN, HOSP PERFORMED  BLOOD GAS, ARTERIAL  I-STAT CG4 LACTIC ACID, ED  TROPONIN I (HIGH SENSITIVITY)  TROPONIN I (HIGH SENSITIVITY)    CT HEAD WO CONTRAST ( )  Final Result    CT Angio Chest Pulmonary Embolism (PE) W or WO Contrast  Final Result    DG Chest Portable 1 View  Final Result      Medications  famotidine (PEPCID) tablet 20 mg (20 mg Per Tube Not Given 11/27/22 0244)  docusate (COLACE) 50 MG/5ML liquid 100 mg (100 mg Per Tube Not Given 11/27/22 0244)  polyethylene glycol  (MIRALAX / GLYCOLAX) packet 17 g (has no administration in time range)  Oral care mouth rinse (15 mLs Mouth Rinse Given 11/27/22 0542)  Oral care mouth rinse (15 mLs Mouth Rinse Given 11/27/22 0541)  fentaNYL in NS (30mcg/ml) infusion-PREMIX (200 mcg/hr Intravenous Rate/Dose Change 11/27/22 0328)  fentaNYL (SUBLIMAZE) bolus via infusion 50-100 mcg (has no administration in time range)  propofol (DIPRIVAN) 1000 MG/100ML infusion (50 mcg/kg/min  83.9 kg Intravenous New Bag/Given 11/27/22 0521)  levofloxacin (LEVAQUIN) IVPB 500 mg (0 mg Intravenous Stopped 11/27/22 0359)  revefenacin (YUPELRI) nebulizer solution 175 mcg (has no administration in time range)  arformoterol (BROVANA) nebulizer solution 15 mcg (has no administration in time range)  budesonide (PULMICORT) nebulizer solution 0.5 mg (has no administration in time range)  albuterol (PROVENTIL) (2.5 MG/3ML) 0.083% nebulizer solution 2.5 mg (has no administration in time range)  methylPREDNISolone sodium succinate (SOLU-MEDROL) 40 mg/mL injection 40 mg (40 mg Intravenous Given 11/27/22 0243)  enoxaparin (LOVENOX) injection 40 mg (has no administration in time range)  levothyroxine (SYNTHROID) tablet 137 mcg (137 mcg Per Tube Given 11/27/22 0541)  Chlorhexidine Gluconate Cloth 2 % PADS 6 each (has no administration in time range)  feeding supplement (VITAL HIGH PROTEIN) liquid 1,000 mL (has no administration in time range)  lactated ringers infusion ( Intravenous New Bag/Given 11/27/22 0252)  etomidate (AMIDATE) injection 20 mg (20 mg Intravenous Given 11/27/22 0043)  rocuronium bromide 10 mg/mL (PF) syringe (100 mg Intravenous Given 11/27/22 0038)  lactated ringers bolus 1,000 mL (0 mLs Intravenous Stopped 11/27/22 0111)  cefTRIAXone (ROCEPHIN) 2 g in sodium chloride 0.9 % 100 mL IVPB (0 g Intravenous Stopped 11/27/22 0141)  azithromycin (ZITHROMAX) 500 mg in sodium chloride 0.9 % 250 mL IVPB (0 mg Intravenous Stopped 11/27/22 0250)   sodium chloride 0.9 % bolus 1,000 mL (0 mLs Intravenous Stopped 11/27/22 0112)  sodium chloride 0.9 % bolus 1,000 mL (0 mLs Intravenous Stopped 11/27/22 0141)  ipratropium-albuterol (DUONEB) 0.5-2.5 (3) MG/3ML nebulizer solution (  Given 11/27/22 0110)  iohexol (OMNIPAQUE) 350 MG/ML injection 75 mL (75 mLs Intravenous Contrast Given 11/27/22 0149)     Procedures  /  Critical Care .Critical Care  Performed by: Sabas Sous, MD Authorized by: Sabas Sous, MD   Critical care provider statement:    Critical care time (minutes):  55   Critical care was necessary to treat or prevent imminent or life-threatening deterioration of the following conditions:  Respiratory failure   Critical care was time spent personally by me on the following activities:  Development of treatment plan with patient or surrogate, discussions with consultants, evaluation of patient's response to treatment, examination of patient, ordering and review of laboratory studies, ordering and review of radiographic studies, ordering and performing treatments and interventions, pulse oximetry, re-evaluation of patient's condition and review of old charts Procedure Name: Intubation Date/Time: 11/27/2022 1:07 AM  Performed by: Sabas Sous, MDPre-anesthesia Checklist: Patient identified, Patient being monitored, Emergency Drugs available, Timeout performed and Suction available Oxygen Delivery Method: Non-rebreather mask Preoxygenation: Pre-oxygenation with 100% oxygen Induction Type: Rapid sequence Ventilation: Mask ventilation without difficulty Laryngoscope Size: Mac and 4 Grade View: Grade II Tube size: 7.5 mm Number of attempts: 1 Airway Equipment and Method: Stylet Placement Confirmation: ETT inserted through vocal cords under direct vision, CO2 detector and Breath sounds checked- equal and bilateral Secured at: 23 cm Tube secured with: ETT holder      ED Course and Medical Decision Making  Initial  Impression and Ddx Most likely COPD exacerbation however she completely unresponsive, also hypothermic, tachycardic, hypotensive.  Other concerns include CHF exacerbation, PE, intracranial bleeding.  Required immediate intubation on arrival.  Prior to intubation could only maximize her oxygenation to 86% with bag-valve-mask.  Provided with a fluid bolus to support her blood pressure for the intubation.  Given her complete unresponsiveness and her hypotension, RSI performed with paralytic alone to try to avoid any further hypotension which would increase risk for periintubation arrest.  See procedural details above, intubation went well.  Awaiting diagnostics, will admit to ICU.  Past medical/surgical history that increases complexity of ED encounter: COPD  Interpretation of Diagnostics I personally reviewed the EKG and my interpretation is as follows: Sinus rhythm  Sinus rhythm  Patient Reassessment and Ultimate Disposition/Management     ICU admission  Patient management required discussion with the following services or consulting groups:  Intensivist Service  Complexity of Problems Addressed Acute illness or injury that poses threat of life of bodily function  Additional Data Reviewed and Analyzed Further history obtained from: EMS on arrival  Additional Factors Impacting ED Encounter Risk Consideration of hospitalization  Elmer Sow. Pilar Plate, MD [facility] Emergency Medicine [facility] [email or web address]  Final Clinical Impressions(s) / ED Diagnoses     ICD-10-CM   1. Acute respiratory failure with hypercapnia (HCC)  J96.02       ED Discharge Orders     None        Discharge Instructions Discussed with and Provided to Patient:   Discharge Instructions   None  Sabas Sous, MD 11/27/22 [phone]

## 2022-11-27 NOTE — ED Notes (Signed)
ED TO INPATIENT HANDOFF REPORT  ED Nurse Name and Phone #:  Lucious Groves [phone]  S Name/Age/Gender Meghan Contreras 57 y.o. female Room/Bed: TRABC/TRABC  Code Status   Code Status: Prior  Home/SNF/Other Home Patient oriented to: self, place, time, and situation Is this baseline? Yes   Triage Complete: Triage complete  Chief Complaint respiratroy failure  Triage Note Patient arrived with EMS from home reports worsening SOB this evening , received Duoneb treatment by EMS with no improvement , intubated by EDP at arrival .    Allergies Allergies  Allergen Reactions   Amoxicillin Swelling and Rash   Latex Hives and Swelling   Penicillins Other (See Comments)    Facial swelling    Level of Care/Admitting Diagnosis ED Disposition     None       B Medical/Surgery History Past Medical History:  Diagnosis Date   Anemia    Anxiety    Bipolar disorder (HCC)    COPD (chronic obstructive pulmonary disease) (HCC)    Depression    Elevated brain natriuretic peptide (BNP) level    Elevated serum creatinine    HOH (hard of hearing)    Hyperglycemia    Hyperlipidemia 2021   Hyperthyroidism    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  Old records from [facility] with CT of soft tissue of chest and neck 11/2017:  No findings to suggest cause of stridorous breathing and no obvious lung disease.     Tobacco dependence    Past Surgical History:  Procedure Laterality Date   ROBOTIC ASSISTED BILATERAL SALPINGO OOPHERECTOMY N/A 01/08/2022   Procedure: XI ROBOTIC ASSISTED BILATERAL SALPINGO OOPHORECTOMY;  Surgeon: Carver Fila, MD;  Location: [facility];  Service: Gynecology;  Laterality: N/A;   THYROIDECTOMY  2019     A IV  Location/Drains/Wounds Patient Lines/Drains/Airways Status     Active Line/Drains/Airways     Name Placement date Placement time Site Days   Peripheral IV 11/27/22 20 G Left Antecubital 11/27/22  --  Antecubital  less than 1   Peripheral IV 11/27/22 20 G Right Forearm 11/27/22  0044  Forearm  less than 1   NG/OG Vented/Dual Lumen 18 Fr. Oral 11/27/22  --  Oral  less than 1   Urethral Catheter Mary RN 14 Fr. 11/27/22  --  --  less than 1   Airway 7.2 mm 11/27/22  0043  -- less than 1   Incision - 5 Ports Abdomen Right;Lateral Right;Lateral;Mid Umbilicus;Superior Left;Lateral;Upper Left;Lateral;Mid 01/08/22  1602  -- 323            Intake/Output Last 24 hours  Intake/Output Summary (Last 24 hours) at 11/27/2022 0204 Last data filed at 11/27/2022 0141 Gross per 24 hour  Intake 3100 ml  Output --  Net 3100 ml    Labs/Imaging Results for orders placed or performed during the hospital encounter of 11/27/22 (from the past 48 hour(s))  Comprehensive metabolic panel     Status: Abnormal   Collection Time: 11/27/22 12:46 AM  Result Value Ref Range   Sodium 139 135 - 145 mmol/L   Potassium 4.4 3.5 - 5.1 mmol/L   Chloride 100 98 - 111 mmol/L   CO2 32  22 - 32 mmol/L   Glucose, Bld 142 (H) 70 - 99 mg/dL    Comment: Glucose reference range applies only to samples taken after fasting for at least 8 hours.   BUN 22 (H) 6 - 20 mg/dL   Creatinine, Ser 7.82 0.44 - 1.00 mg/dL   Calcium 8.6 (L) 8.9 - 10.3 mg/dL   Total Protein 7.5 6.5 - 8.1 g/dL   Albumin 3.5 3.5 - 5.0 g/dL   AST 23 15 - 41 U/L   ALT 18 0 - 44 U/L   Alkaline Phosphatase 67 38 - 126 U/L   Total Bilirubin 0.8 0.3 - 1.2 mg/dL   GFR, Estimated >95 >62 mL/min    Comment: (NOTE) Calculated using the CKD-EPI Creatinine Equation (2021)    Anion gap 7 5 - 15    Comment: Performed at [facility], [address], [place], [place] [address]  CBC with Differential     Status: Abnormal   Collection Time: 11/27/22 12:46 AM   Result Value Ref Range   WBC 7.2 4.0 - 10.5 K/uL   RBC 5.15 (H) 3.87 - 5.11 MIL/uL   Hemoglobin 12.6 12.0 - 15.0 g/dL   HCT 57.8 46.9 - 62.9 %   MCV 83.7 80.0 - 100.0 fL   MCH 24.5 (L) 26.0 - 34.0 pg   MCHC 29.2 (L) 30.0 - 36.0 g/dL   RDW 52.8 (H) 41.3 - 24.4 %   Platelets 244 150 - 400 K/uL   nRBC 0.6 (H) 0.0 - 0.2 %   Neutrophils Relative % 64 %   Neutro Abs 4.6 1.7 - 7.7 K/uL   Lymphocytes Relative 32 %   Lymphs Abs 2.3 0.7 - 4.0 K/uL   Monocytes Relative 3 %   Monocytes Absolute 0.2 0.1 - 1.0 K/uL   Eosinophils Relative 0 %   Eosinophils Absolute 0.0 0.0 - 0.5 K/uL   Basophils Relative 1 %   Basophils Absolute 0.1 0.0 - 0.1 K/uL   nRBC 1 (H) 0 /100 WBC   Abs Immature Granulocytes 0.00 0.00 - 0.07 K/uL   Polychromasia PRESENT     Comment: Performed at [facility], [address], [place], [place] [address]  Protime-INR     Status: None   Collection Time: 11/27/22 12:46 AM  Result Value Ref Range   Prothrombin Time 14.1 11.4 - 15.2 seconds   INR 1.1 0.8 - 1.2    Comment: (NOTE) INR goal varies based on device and disease states. Performed at [facility], [address] [place], [place] [address]   APTT     Status: None   Collection Time: 11/27/22 12:46 AM  Result Value Ref Range   aPTT 31 24 - 36 seconds    Comment: Performed at [facility], [address], [place], [place] [address]  Troponin I (High Sensitivity)     Status: None   Collection Time: 11/27/22 12:46 AM  Result Value Ref Range   Troponin I (High Sensitivity) 14 <18 ng/L    Comment: (NOTE) Elevated high sensitivity troponin I (hsTnI) values and significant  changes across serial measurements may suggest ACS but many other  chronic and acute conditions are known to elevate hsTnI results.  Refer to the "Links" section for chest pain algorithms and additional  guidance. Performed at [facility], [address], [place], [place] [address]   Brain natriuretic peptide      Status: Abnormal   Collection Time:  11/27/22 12:46 AM  Result Value Ref Range   B Natriuretic Peptide 525.6 (H) 0.0 - 100.0 pg/mL    Comment: Performed at [facility], [address] [place], [place] [address]  I-Stat Lactic Acid, ED     Status: None   Collection Time: 11/27/22 12:56 AM  Result Value Ref Range   Lactic Acid, Venous 0.6 0.5 - 1.9 mmol/L  I-Stat venous blood gas, ED     Status: Abnormal   Collection Time: 11/27/22 12:57 AM  Result Value Ref Range   pH, Ven 7.165 (LL) 7.25 - 7.43   pCO2, Ven 97.8 (HH) 44 - 60 mmHg   pO2, Ven 81 (H) 32 - 45 mmHg   Bicarbonate 35.3 (H) 20.0 - 28.0 mmol/L   TCO2 38 (H) 22 - 32 mmol/L   O2 Saturation 91 %   Acid-Base Excess 3.0 (H) 0.0 - 2.0 mmol/L   Sodium 141 135 - 145 mmol/L   Potassium 4.3 3.5 - 5.1 mmol/L   Calcium, Ion 1.16 1.15 - 1.40 mmol/L   HCT 44.0 36.0 - 46.0 %   Hemoglobin 15.0 12.0 - 15.0 g/dL   Sample type VENOUS    Comment NOTIFIED PHYSICIAN   I-Stat arterial blood gas, ED ([facility], [facility], [facility])     Status: Abnormal   Collection Time: 11/27/22  1:19 AM  Result Value Ref Range   pH, Arterial 7.271 (L) 7.35 - 7.45   pCO2 arterial 70.2 (HH) 32 - 48 mmHg   pO2, Arterial 265 (H) 83 - 108 mmHg   Bicarbonate 32.8 (H) 20.0 - 28.0 mmol/L   TCO2 35 (H) 22 - 32 mmol/L   O2 Saturation 100 %   Acid-Base Excess 3.0 (H) 0.0 - 2.0 mmol/L   Sodium 142 135 - 145 mmol/L   Potassium 4.0 3.5 - 5.1 mmol/L   Calcium, Ion 1.15 1.15 - 1.40 mmol/L   HCT 38.0 36.0 - 46.0 %   Hemoglobin 12.9 12.0 - 15.0 g/dL   Patient temperature 19.1 F    Sample type ARTERIAL    Comment NOTIFIED PHYSICIAN    CT Angio Chest Pulmonary Embolism (PE) W or WO Contrast  Result Date: 11/27/2022 CLINICAL DATA:  Worsening shortness of breath EXAM: CT ANGIOGRAPHY CHEST WITH CONTRAST TECHNIQUE: Multidetector CT imaging of the chest was performed using the standard protocol during bolus administration of intravenous contrast. Multiplanar CT image reconstructions  and MIPs were obtained to evaluate the vascular anatomy. RADIATION DOSE REDUCTION: This exam was performed according to the departmental dose-optimization program which includes automated exposure control, adjustment of the mA and/or kV according to patient size and/or use of iterative reconstruction technique. CONTRAST:  75mL OMNIPAQUE IOHEXOL 350 MG/ML SOLN COMPARISON:  Chest x-ray from earlier in the same day. FINDINGS: Cardiovascular: Thoracic aorta shows dilatation or dissection. Heart is mildly prominent. Pulmonary artery shows no filling defect to suggest pulmonary embolism. Mediastinum/Nodes: Thoracic inlet is within normal limits. Endotracheal tube and gastric catheter are noted in satisfactory position. The esophagus is within normal limits. Lungs/Pleura: Bilateral lower lobe consolidation is noted. No sizable effusion is seen. No parenchymal nodules are noted. Upper Abdomen: No acute abnormality. Musculoskeletal: No chest wall abnormality. No acute or significant osseous findings. Review of the MIP images confirms the above findings. IMPRESSION: Bilateral lower lobe consolidation. Electronically Signed   By: Alcide Clever M.D.   On: 11/27/2022 01:54   CT HEAD WO CONTRAST ( )  Result Date: 11/27/2022 CLINICAL DATA:  Delirium EXAM: CT HEAD WITHOUT  CONTRAST TECHNIQUE: Contiguous axial images were obtained from the base of the skull through the vertex without intravenous contrast. RADIATION DOSE REDUCTION: This exam was performed according to the departmental dose-optimization program which includes automated exposure control, adjustment of the mA and/or kV according to patient size and/or use of iterative reconstruction technique. COMPARISON:  None Available. FINDINGS: Brain: No evidence of acute infarction, hemorrhage, hydrocephalus, extra-axial collection or mass lesion/mass effect. Vascular: No hyperdense vessel or unexpected calcification. Skull: Normal. Negative for fracture or focal lesion.  Sinuses/Orbits: No acute finding. Other: None. IMPRESSION: No acute intracranial abnormality noted. Electronically Signed   By: Alcide Clever M.D.   On: 11/27/2022 01:52   DG Chest Portable 1 View  Result Date: 11/27/2022 CLINICAL DATA:  Intubation, shortness of breath EXAM: PORTABLE CHEST 1 VIEW COMPARISON:  06/07/2022 FINDINGS: Endotracheal to is 3 cm above the carina. NG tube is in the stomach. Heart is borderline in size. Bilateral lower lobe airspace opacities. No visible effusions or pneumothorax. No acute bony abnormality. IMPRESSION: Bilateral lower lobe atelectasis or infiltrates. Electronically Signed   By: Charlett Nose M.D.   On: 11/27/2022 00:58    Pending Labs Unresulted Labs (From admission, onward)     Start     Ordered   11/27/22 0330  Blood gas, arterial  Once,   R        11/27/22 0155   11/27/22 0059  Rapid urine drug screen (hospital performed)  ONCE - STAT,   STAT        11/27/22 0058   11/27/22 0044  Resp panel by RT-PCR (RSV, Flu A&B, Covid) Anterior Nasal Swab  (Septic presentation on arrival (screening labs, nursing and treatment orders for obvious sepsis))  Once,   URGENT        11/27/22 0045   11/27/22 0044  Blood Culture (routine x 2)  (Septic presentation on arrival (screening labs, nursing and treatment orders for obvious sepsis))  BLOOD CULTURE X 2,   STAT      11/27/22 0045   11/27/22 0044  Urinalysis, w/ Reflex to Culture (Infection Suspected) -Urine, Clean Catch  (Septic presentation on arrival (screening labs, nursing and treatment orders for obvious sepsis))  ONCE - URGENT,   URGENT       Question:  Specimen Source  Answer:  Urine, Clean Catch   11/27/22 0045            Vitals/Pain Today's Vitals   11/27/22 0115 11/27/22 0130 11/27/22 0145 11/27/22 0200  BP: (!) 146/100 (!) 148/96 (!) 152/97   Pulse: 81 81 95   Resp: 18 19 (!) 22   Temp:    (!) 96.4 F (35.8 C)  TempSrc:    Temporal  SpO2: 100% 100% 100%   Weight:      Height:         Isolation Precautions No active isolations  Medications Medications  azithromycin (ZITHROMAX) 500 mg in sodium chloride 0.9 % 250 mL IVPB (500 mg Intravenous New Bag/Given 11/27/22 0146)  propofol (DIPRIVAN) 1000 MG/100ML infusion ( Intravenous Not Given 11/27/22 0056)  fentaNYL in NS (10mcg/ml) infusion-PREMIX (50 mcg/hr Intravenous Rate/Dose Change 11/27/22 0145)  ipratropium-albuterol (DUONEB) 0.5-2.5 (3) MG/3ML nebulizer solution 3 mL ( Nebulization Canceled Entry 11/27/22 0110)  etomidate (AMIDATE) injection 20 mg (20 mg Intravenous Given 11/27/22 0043)  rocuronium bromide 10 mg/mL (PF) syringe (100 mg Intravenous Given 11/27/22 0038)  lactated ringers bolus 1,000 mL (0 mLs Intravenous Stopped 11/27/22 0111)  cefTRIAXone (ROCEPHIN) 2 g  in sodium chloride 0.9 % 100 mL IVPB (0 g Intravenous Stopped 11/27/22 0141)  sodium chloride 0.9 % bolus 1,000 mL (0 mLs Intravenous Stopped 11/27/22 0112)  sodium chloride 0.9 % bolus 1,000 mL (0 mLs Intravenous Stopped 11/27/22 0141)  ipratropium-albuterol (DUONEB) 0.5-2.5 (3) MG/3ML nebulizer solution (  Given 11/27/22 0110)  iohexol (OMNIPAQUE) 350 MG/ML injection 75 mL (75 mLs Intravenous Contrast Given 11/27/22 0149)    Mobility walks     Focused Assessments     R Recommendations: See Admitting Provider Note  Report given to:   Additional Notes:

## 2022-11-27 NOTE — Progress Notes (Signed)
Patient seen, examined and chart reviewed  In brief 57 year old female with tobacco dependence and COPD was brought into the emergency department with respiratory distress, requiring BMV followed by intubation  On workup CT chest showed bilateral lower lobe pneumonia    Physical exam: General: Crtitically ill-appearing female, orally intubated HEENT: [place]/AT, eyes anicteric.  ETT and OGT in place Neuro: Sedated, not following commands.  Eyes are closed.  Pupils 3 mm bilateral reactive to light Chest: Bilateral basal crackles no wheezes or rhonchi Heart: Regular rate and rhythm, no murmurs or gallops Abdomen: Soft, nondistended, bowel sounds present Skin: No rash  Labs and images reviewed  Assessment plan: Acute hypoxic/hypercapnic respiratory failure Acute respiratory acidosis, resolved Acute COPD exacerbation Bilateral lower lobe pneumonia, likely aspiration Hypocalcemia  Continue lung protective ventilation Hypercapnia has cleared VAP prevention bundle in place Continue nebs Continue IV antibiotics with Levaquin Continue IV steroid Continue aggressive electrolyte replacement and monitor  Critical care time spent 32 minutes    Cheri Fowler, MD [facility] See Amion for pager If no response to pager, please call [phone] until 7pm After 7pm, Please call E-link [phone]

## 2022-11-27 NOTE — ED Notes (Signed)
Patient transported to CT scan . 

## 2022-11-27 NOTE — Progress Notes (Signed)
RT called to pt room to assist with intubation

## 2022-11-27 NOTE — H&P (Signed)
NAME:  Meghan Contreras, MRN:  [id number], DOB:  08-12-1965, LOS: 0 ADMISSION DATE:  11/27/2022, CONSULTATION DATE: 9/28 REFERRING MD:  Pilar Plate, CHIEF COMPLAINT: Respiratory failure  History of Present Illness:  57 year old female patient with history as noted below most significantly with history of possible emphysema, ongoing tobacco abuse, and vocal cord injury after thyroid surgery.  Called EMS with acute onset of shortness of breath Initial history not available apparently received bronchodilator en route, had progressive decline in spite of bronchodilator to the point where she was requiring manual mask ventilation she was intubated shortly after arrival to the emergency room ER evaluation: White blood cell count 7.2 hemoglobin 12.6 INR 1.1 BUN/creatinine unremarkable BNP 525, lactate 0.6 venous blood gas pH 7.17 pCO2 of 98, \HCO3 35.  CT of chest showed bilateral lower lobe consolidation without pulmonary emboli, CT head unremarkable Interventions in the ER included: In addition to mechanical ventilation she was started on empiric antibiotics, IV hydration, and scheduled bronchodilators Pulmonary asked to admit.  Pertinent  Medical History  Vocal cord paralysis felt secondary to prior thyroid surgery Possible COPD Tobacco abuse Peripheral edema Hypothyroidism Anemia hyperlipidemia hyperglycemia hearing deficits  Significant Hospital Events: Including procedures, antibiotic start and stop dates in addition to other pertinent events   9/28 admitted with acute hypoxic and hypercarbic respiratory failure secondary to pneumonia intubated and ER.  Started on empiric antibiotics  Interim History / Subjective:  Sedated  Objective   Blood pressure (!) 137/92, pulse 80, temperature (!) 96.4 F (35.8 C), temperature source Temporal, resp. rate (!) 22, height 5\' 1"  (1.549 m), weight 83.9 kg, SpO2 100%.    Vent Mode: PRVC FiO2 (%):  [50 %-100 %] 50 % Set Rate:  [18 bmp-22 bmp] 22 bmp Vt Set:   [400 mL] 400 mL PEEP:  [5 cmH20] 5 cmH20   Intake/Output Summary (Last 24 hours) at 11/27/2022 4098 Last data filed at 11/27/2022 0141 Gross per 24 hour  Intake 3100 ml  Output --  Net 3100 ml   Filed Weights   11/27/22 0100  Weight: 83.9 kg    Examination: General: 57 year old female patient sedated on mechanical ventilator HENT: Normocephalic atraumatic pupils equal reactive mucous membranes moist orally intubated Lungs: Some crackles bases, no wheezing.  Chest x-ray with bibasilar airspace disease CT chest bilateral basilar airspace disease with air bronchograms and consolidation Cardiovascular: Regular rate and rhythm Abdomen: Soft nontender Extremities: Mild lower extremity edema Neuro: Heavily sedated GU: Clear yellow  Resolved Hospital Problem list     Assessment & Plan:  Acute hypoxic and hypercarbic respiratory failure secondary to pneumonia, and possibly exacerbated further by history of VCD Plan Continue full ventilator support Scheduled bronchodilators Continue azithromycin and ceftriaxone Systemic steroids PAD protocol RASS goal -2 VAP bundle A.m. chest x-ray Follow-up ABG pending ventilator changes Check echocardiogram given elevated BNP  Hyperglycemia Plan Sliding scale insulin Global glucose 140-180  History of hypothyroidism Plan Continue replacement    Best Practice (right click and "Reselect all SmartList Selections" daily)   Diet/type: tubefeeds DVT prophylaxis: LMWH GI prophylaxis: H2B Lines: N/A Foley:  Yes, and it is still needed Code Status:  full code Last date of multidisciplinary goals of care discussion [pending ]  Labs   CBC: Recent Labs  Lab 11/27/22 0046 11/27/22 0057 11/27/22 0119  WBC 7.2  --   --   NEUTROABS 4.6  --   --   HGB 12.6 15.0 12.9  HCT 43.1 44.0 38.0  MCV 83.7  --   --  PLT 244  --   --     Basic Metabolic Panel: Recent Labs  Lab 11/27/22 0046 11/27/22 0057 11/27/22 0119  NA 139 141 142  K  4.4 4.3 4.0  CL 100  --   --   CO2 32  --   --   GLUCOSE 142*  --   --   BUN 22*  --   --   CREATININE 1.00  --   --   CALCIUM 8.6*  --   --    GFR: Estimated Creatinine Clearance: 61.7 mL/min (by C-G formula based on SCr of 1 mg/dL). Recent Labs  Lab 11/27/22 0046 11/27/22 0056  WBC 7.2  --   LATICACIDVEN  --  0.6    Liver Function Tests: Recent Labs  Lab 11/27/22 0046  AST 23  ALT 18  ALKPHOS 67  BILITOT 0.8  PROT 7.5  ALBUMIN 3.5   No results for input(s): "LIPASE", "AMYLASE" in the last 168 hours. No results for input(s): "AMMONIA" in the last 168 hours.  ABG    Component Value Date/Time   PHART 7.271 (L) 11/27/2022 0119   PCO2ART 70.2 (HH) 11/27/2022 0119   PO2ART 265 (H) 11/27/2022 0119   HCO3 32.8 (H) 11/27/2022 0119   TCO2 35 (H) 11/27/2022 0119   O2SAT 100 11/27/2022 0119     Coagulation Profile: Recent Labs  Lab 11/27/22 0046  INR 1.1    Cardiac Enzymes: No results for input(s): "CKTOTAL", "CKMB", "CKMBINDEX", "TROPONINI" in the last 168 hours.  HbA1C: Hgb A1c MFr Bld  Date/Time Value Ref Range Status  06/22/2022 01:04 PM 5.9 (H) 4.8 - 5.6 % Final    Comment:             Prediabetes: 5.7 - 6.4          Diabetes: >6.4          Glycemic control for adults with diabetes: <7.0   01/07/2022 04:39 PM 5.8 (H) 4.8 - 5.6 % Final    Comment:    (NOTE) Pre diabetes:          5.7%-6.4%  Diabetes:              >6.4%  Glycemic control for   <7.0% adults with diabetes     CBG: No results for input(s): "GLUCAP" in the last 168 hours.  Review of Systems:   Not able   Past Medical History:  She,  has a past medical history of Anemia, Anxiety, Bipolar disorder (HCC), COPD (chronic obstructive pulmonary disease) (HCC), Depression, Elevated brain natriuretic peptide (BNP) level, Elevated serum creatinine, HOH (hard of hearing), Hyperglycemia, Hyperlipidemia (2021), Hyperthyroidism, Left Ovarian torsion (01/06/2022), Opiate addiction (HCC), Pelvic  mass, Peripheral edema, PTSD (post-traumatic stress disorder), Stridor (2019), and Tobacco dependence.   Surgical History:   Past Surgical History:  Procedure Laterality Date   ROBOTIC ASSISTED BILATERAL SALPINGO OOPHERECTOMY N/A 01/08/2022   Procedure: XI ROBOTIC ASSISTED BILATERAL SALPINGO OOPHORECTOMY;  Surgeon: Carver Fila, MD;  Location: [facility];  Service: Gynecology;  Laterality: N/A;   THYROIDECTOMY  2019     Social History:   reports that she has been smoking cigarettes. She has a 10.5 pack-year smoking history. She has been exposed to tobacco smoke. She has never used smokeless tobacco. She reports that she does not currently use drugs. She reports that she does not drink alcohol.   Family History:  Her family history includes Cancer in her sister; Drug abuse in her brother, mother, sister,  sister, sister, sister, sister, and sister; HIV/AIDS in her mother; Hepatitis C in her brother.   Allergies Allergies  Allergen Reactions   Amoxicillin Swelling and Rash   Latex Hives and Swelling   Penicillins Other (See Comments)    Facial swelling     Home Medications  Prior to Admission medications   Medication Sig Start Date End Date Taking? Authorizing Provider  albuterol (VENTOLIN HFA) 108 (90 Base) MCG/ACT inhaler Inhale 2 puffs into the lungs as needed for wheezing or shortness of breath. 06/22/22   Julieanne Manson, MD  atorvastatin (LIPITOR) 40 MG tablet TAKE 1 TABLET BY MOUTH DAILY WITH THE EVENING MEAL 01/18/22   Julieanne Manson, MD  Fluticasone-Umeclidin-Vilant (TRELEGY ELLIPTA) 100-62.5-25 MCG/ACT AEPB Inhale 1 puff into the lungs daily. 06/22/22   Julieanne Manson, MD  furosemide (LASIX) 40 MG tablet Take 1 tablet (40 mg total) by mouth daily. 06/22/22   Julieanne Manson, MD  gabapentin (NEURONTIN) 100 MG capsule Take 1 capsule (100 mg total) by mouth 3 (three) times daily. 08/17/22   Shanna Cisco, NP  ibuprofen (ADVIL) 200 MG tablet Take 400-600 mg  by mouth 2 (two) times daily as needed for moderate pain.    [provider]  iron polysaccharides (NIFEREX) 150 MG capsule Take 1 capsule (150 mg total) by mouth daily. 06/25/22   Julieanne Manson, MD  levothyroxine (SYNTHROID) 150 MCG tablet Take 1 tablet (150 mcg total) by mouth daily before breakfast. 06/22/22   Julieanne Manson, MD  methadone (DOLOPHINE) 10 MG/ML solution Take 55 mg by mouth daily. Verified with RN Mariea Clonts 54 mg    [provider]  nicotine (NICODERM CQ) 21 mg/24hr patch Place 1 patch (21 mg total) onto the skin daily. 04/05/22   Shanna Cisco, NP  nicotine (NICODERM CQ) 21 mg/24hr patch Place 1 patch (21 mg total) onto the skin daily. 08/17/22   Shanna Cisco, NP  potassium chloride SA (KLOR-CON M) 20 MEQ tablet Take 1 tablet (20 mEq total) by mouth 2 (two) times daily for 4 days. 06/07/22 06/11/22  Redwine, Madison A, PA-C  prazosin (MINIPRESS) 2 MG capsule Take 2 capsules (4 mg total) by mouth at bedtime. 08/17/22   Shanna Cisco, NP  QUEtiapine (SEROQUEL) 100 MG tablet 1 1/2 tabs in the morning 08/17/22   Toy Cookey E, NP  QUEtiapine (SEROQUEL) 300 MG tablet Take 1 tablet (300 mg total) by mouth at bedtime. 08/17/22   Shanna Cisco, NP  senna-docusate (SENOKOT-S) 8.6-50 MG tablet Take 2 tablets by mouth at bedtime. Do not take if having loose stools Patient not taking: Reported on 06/22/2022 01/11/22   Warner Mccreedy D, NP     Critical care time: 32 min

## 2022-11-27 NOTE — Progress Notes (Signed)
Patient was transported to 2H21 from ER via the ventilator with no complications.

## 2022-11-27 NOTE — Sepsis Progress Note (Signed)
Elink monitoring for the code sepsis protocol.  

## 2022-11-27 NOTE — ED Triage Notes (Signed)
Patient arrived with EMS from home reports worsening SOB this evening , received Duoneb treatment by EMS with no improvement , intubated by EDP at arrival .

## 2022-11-28 ENCOUNTER — Inpatient Hospital Stay (HOSPITAL_COMMUNITY): Payer: 59

## 2022-11-28 ENCOUNTER — Encounter (HOSPITAL_COMMUNITY)
Admission: EM | Disposition: A | Discharge status: Home Health | Payer: Self-pay | Source: Home / Self Care | Attending: Internal Medicine

## 2022-11-28 ENCOUNTER — Encounter (HOSPITAL_COMMUNITY): Payer: Self-pay | Admitting: Pulmonary Disease

## 2022-11-28 ENCOUNTER — Inpatient Hospital Stay (HOSPITAL_COMMUNITY): Payer: 59 | Admitting: Anesthesiology

## 2022-11-28 ENCOUNTER — Other Ambulatory Visit: Payer: Self-pay

## 2022-11-28 DIAGNOSIS — Z9911 Dependence on respirator [ventilator] status: Secondary | ICD-10-CM | POA: Diagnosis not present

## 2022-11-28 DIAGNOSIS — J441 Chronic obstructive pulmonary disease with (acute) exacerbation: Secondary | ICD-10-CM | POA: Diagnosis not present

## 2022-11-28 DIAGNOSIS — J9600 Acute respiratory failure, unspecified whether with hypoxia or hypercapnia: Secondary | ICD-10-CM

## 2022-11-28 DIAGNOSIS — J9602 Acute respiratory failure with hypercapnia: Secondary | ICD-10-CM | POA: Diagnosis not present

## 2022-11-28 HISTORY — PX: TRACHEOSTOMY TUBE PLACEMENT: SHX814

## 2022-11-28 HISTORY — PX: DIRECT LARYNGOSCOPY: SHX5326

## 2022-11-28 LAB — GLUCOSE, CAPILLARY
Glucose-Capillary: 118 mg/dL — ABNORMAL HIGH (ref 70–99)
Glucose-Capillary: 123 mg/dL — ABNORMAL HIGH (ref 70–99)
Glucose-Capillary: 109 mg/dL — ABNORMAL HIGH (ref 70–99)
Glucose-Capillary: 122 mg/dL — ABNORMAL HIGH (ref 70–99)
Glucose-Capillary: 133 mg/dL — ABNORMAL HIGH (ref 70–99)

## 2022-11-28 LAB — TRIGLYCERIDES: Triglycerides: 110 mg/dL (ref <150)

## 2022-11-28 SURGERY — LARYNGOSCOPY, DIRECT
Anesthesia: General

## 2022-11-28 MED ORDER — ONDANSETRON HCL 4 MG/2ML IJ SOLN
INTRAMUSCULAR | Status: AC
  Filled 2022-11-28: qty 2

## 2022-11-28 MED ORDER — FUROSEMIDE 10 MG/ML IJ SOLN: 20.0000 mg | Freq: Once | INTRAMUSCULAR | Status: DC

## 2022-11-28 MED ORDER — OXYMETAZOLINE HCL 0.05 % NA SOLN
NASAL | Status: AC
  Filled 2022-11-28: qty 30

## 2022-11-28 MED ORDER — DEXAMETHASONE SODIUM PHOSPHATE 10 MG/ML IJ SOLN
INTRAMUSCULAR | Status: DC | PRN
  Administered 2022-11-28: 10 mg via INTRAVENOUS

## 2022-11-28 MED ORDER — FENTANYL CITRATE (PF) 250 MCG/5ML IJ SOLN
INTRAMUSCULAR | Status: DC | PRN
  Administered 2022-11-28 (×3): 50 ug via INTRAVENOUS

## 2022-11-28 MED ORDER — ORAL CARE MOUTH RINSE: 15.0000 mL | Freq: Once | OROMUCOSAL | Status: AC

## 2022-11-28 MED ORDER — RACEPINEPHRINE HCL 2.25 % IN NEBU
0.5000 mL | INHALATION_SOLUTION | RESPIRATORY_TRACT | Status: DC | PRN
  Administered 2022-11-28: 0.5 mL via RESPIRATORY_TRACT

## 2022-11-28 MED ORDER — ROCURONIUM BROMIDE 10 MG/ML (PF) SYRINGE
PREFILLED_SYRINGE | INTRAVENOUS | Status: DC | PRN
  Administered 2022-11-28: 50 mg via INTRAVENOUS

## 2022-11-28 MED ORDER — MIDAZOLAM HCL 2 MG/2ML IJ SOLN
INTRAMUSCULAR | Status: AC
  Administered 2022-11-28: 1 mg via INTRAVENOUS
  Filled 2022-11-28: qty 2

## 2022-11-28 MED ORDER — SUCCINYLCHOLINE CHLORIDE 200 MG/10ML IV SOSY
PREFILLED_SYRINGE | INTRAVENOUS | Status: DC | PRN
  Administered 2022-11-28: 120 mg via INTRAVENOUS

## 2022-11-28 MED ORDER — METHYLPREDNISOLONE SODIUM SUCC 40 MG IJ SOLR: 40.0000 mg | Freq: Every day | INTRAMUSCULAR | Status: DC

## 2022-11-28 MED ORDER — PROPOFOL 10 MG/ML IV BOLUS
INTRAVENOUS | Status: AC
  Filled 2022-11-28: qty 20

## 2022-11-28 MED ORDER — DEXAMETHASONE SODIUM PHOSPHATE 10 MG/ML IJ SOLN
INTRAMUSCULAR | Status: AC
  Filled 2022-11-28: qty 1

## 2022-11-28 MED ORDER — MIDAZOLAM HCL 2 MG/2ML IJ SOLN
INTRAMUSCULAR | Status: AC
  Filled 2022-11-28: qty 2

## 2022-11-28 MED ORDER — LIDOCAINE-EPINEPHRINE 1 %-1:100000 IJ SOLN
INTRAMUSCULAR | Status: DC | PRN
  Administered 2022-11-28: 3 mL

## 2022-11-28 MED ORDER — EPINEPHRINE HCL (NASAL) 0.1 % NA SOLN
NASAL | Status: DC | PRN
  Administered 2022-11-28: 10 mL via TOPICAL

## 2022-11-28 MED ORDER — GLYCOPYRROLATE 0.2 MG/ML IJ SOLN
0.1000 mg | Freq: Once | INTRAMUSCULAR | Status: AC
  Administered 2022-11-28: 0.1 mg via INTRAVENOUS
  Filled 2022-11-28: qty 1

## 2022-11-28 MED ORDER — EPINEPHRINE HCL (NASAL) 0.1 % NA SOLN
NASAL | Status: AC
  Filled 2022-11-28: qty 30

## 2022-11-28 MED ORDER — HYDROMORPHONE HCL 1 MG/ML IJ SOLN
0.5000 mg | INTRAMUSCULAR | Status: DC | PRN
  Administered 2022-11-28 – 2022-11-30 (×15): 1 mg via INTRAVENOUS
  Filled 2022-11-28 (×17): qty 1

## 2022-11-28 MED ORDER — METHYLPREDNISOLONE SODIUM SUCC 40 MG IJ SOLR
40.0000 mg | Freq: Every day | INTRAMUSCULAR | Status: DC
  Administered 2022-11-29 – 2022-12-01 (×3): 40 mg via INTRAVENOUS
  Filled 2022-11-28 (×3): qty 1

## 2022-11-28 MED ORDER — LIDOCAINE 2% (20 MG/ML) 5 ML SYRINGE
INTRAMUSCULAR | Status: DC | PRN
  Administered 2022-11-28: 20 mg via INTRAVENOUS

## 2022-11-28 MED ORDER — FENTANYL CITRATE (PF) 250 MCG/5ML IJ SOLN
INTRAMUSCULAR | Status: AC
  Filled 2022-11-28: qty 5

## 2022-11-28 MED ORDER — CHLORHEXIDINE GLUCONATE 0.12 % MT SOLN: 15.0000 mL | Freq: Once | OROMUCOSAL | Status: AC

## 2022-11-28 MED ORDER — MIDAZOLAM HCL 2 MG/2ML IJ SOLN
INTRAMUSCULAR | Status: DC | PRN
  Administered 2022-11-28: 1 mg via INTRAVENOUS

## 2022-11-28 MED ORDER — ONDANSETRON HCL 4 MG/2ML IJ SOLN
INTRAMUSCULAR | Status: DC | PRN
  Administered 2022-11-28: 4 mg via INTRAVENOUS

## 2022-11-28 MED ORDER — SUCCINYLCHOLINE CHLORIDE 200 MG/10ML IV SOSY
PREFILLED_SYRINGE | INTRAVENOUS | Status: AC
  Filled 2022-11-28: qty 10

## 2022-11-28 MED ORDER — LIDOCAINE-EPINEPHRINE 1 %-1:100000 IJ SOLN
INTRAMUSCULAR | Status: AC
  Filled 2022-11-28: qty 1

## 2022-11-28 MED ORDER — MIDAZOLAM HCL 2 MG/2ML IJ SOLN
1.0000 mg | INTRAMUSCULAR | Status: AC | PRN
  Administered 2022-11-28: 1 mg via INTRAVENOUS

## 2022-11-28 MED ORDER — CHLORHEXIDINE GLUCONATE 0.12 % MT SOLN
OROMUCOSAL | Status: AC
  Administered 2022-11-28: 15 mL via OROMUCOSAL
  Filled 2022-11-28: qty 15

## 2022-11-28 MED ORDER — IOHEXOL 350 MG/ML SOLN
75.0000 mL | Freq: Once | INTRAVENOUS | Status: AC | PRN
  Administered 2022-11-28: 75 mL via INTRAVENOUS

## 2022-11-28 MED ORDER — PROPOFOL 10 MG/ML IV BOLUS
INTRAVENOUS | Status: DC | PRN
  Administered 2022-11-28: 130 mg via INTRAVENOUS

## 2022-11-28 MED ORDER — LACTATED RINGERS IV SOLN: INTRAVENOUS | Status: DC

## 2022-11-28 MED ORDER — LORAZEPAM 2 MG/ML IJ SOLN
1.0000 mg | INTRAMUSCULAR | Status: AC | PRN
  Administered 2022-11-28 – 2022-11-29 (×3): 1 mg via INTRAVENOUS
  Filled 2022-11-28 (×3): qty 1

## 2022-11-28 MED ORDER — 0.9 % SODIUM CHLORIDE (POUR BTL) OPTIME
TOPICAL | Status: DC | PRN
  Administered 2022-11-28: 1000 mL

## 2022-11-28 MED ORDER — HYDRALAZINE HCL 20 MG/ML IJ SOLN
10.0000 mg | Freq: Four times a day (QID) | INTRAMUSCULAR | Status: DC | PRN
  Administered 2022-11-28 (×2): 10 mg via INTRAVENOUS
  Filled 2022-11-28 (×2): qty 1

## 2022-11-28 MED ORDER — ROCURONIUM BROMIDE 10 MG/ML (PF) SYRINGE
PREFILLED_SYRINGE | INTRAVENOUS | Status: AC
  Filled 2022-11-28: qty 10

## 2022-11-28 MED ORDER — RACEPINEPHRINE HCL 2.25 % IN NEBU
INHALATION_SOLUTION | RESPIRATORY_TRACT | Status: AC
  Filled 2022-11-28: qty 0.5

## 2022-11-28 MED ORDER — SUGAMMADEX SODIUM 200 MG/2ML IV SOLN
INTRAVENOUS | Status: DC | PRN
  Administered 2022-11-28: 200 mg via INTRAVENOUS

## 2022-11-28 MED ORDER — LIDOCAINE HCL (PF) 4 % IJ SOLN
INTRAMUSCULAR | Status: AC
  Filled 2022-11-28: qty 5

## 2022-11-28 MED ORDER — LIDOCAINE 2% (20 MG/ML) 5 ML SYRINGE
INTRAMUSCULAR | Status: AC
  Filled 2022-11-28: qty 5

## 2022-11-28 MED ORDER — METHYLPREDNISOLONE SODIUM SUCC 40 MG IJ SOLR
40.0000 mg | Freq: Two times a day (BID) | INTRAMUSCULAR | Status: DC
  Administered 2022-11-28: 40 mg via INTRAVENOUS
  Filled 2022-11-28: qty 1

## 2022-11-28 SURGICAL SUPPLY — 58 items
BAG COUNTER SPONGE SURGICOUNT (BAG) ×1 IMPLANT
BAG SPNG CNTER NS LX DISP (BAG) ×1
BLADE CLIPPER SURG (BLADE) IMPLANT
BLADE SURG 15 STRL LF DISP TIS (BLADE) IMPLANT
BLADE SURG 15 STRL SS (BLADE) ×1
CANISTER SUCT 3000ML PPV (MISCELLANEOUS) ×1 IMPLANT
CLEANER TIP ELECTROSURG 2X2 (MISCELLANEOUS) ×1 IMPLANT
CLIP TI WIDE RED SMALL 6 (CLIP) IMPLANT
CORD BIPOLAR FORCEPS 12FT (ELECTRODE) ×1 IMPLANT
COVER BACK TABLE 60X90IN (DRAPES) ×1 IMPLANT
COVER MAYO STAND STRL (DRAPES) ×1 IMPLANT
COVER SURGICAL LIGHT HANDLE (MISCELLANEOUS) ×1 IMPLANT
DRAPE HALF SHEET 40X57 (DRAPES) ×1 IMPLANT
DRESSING MEPILEX FLEX 4X4 (GAUZE/BANDAGES/DRESSINGS) IMPLANT
DRSG MEPILEX FLEX 4X4 (GAUZE/BANDAGES/DRESSINGS) ×1
ELECT COATED BLADE 2.86 ST (ELECTRODE) ×1 IMPLANT
ELECT REM PT RETURN 9FT ADLT (ELECTROSURGICAL) ×1
ELECTRODE REM PT RTRN 9FT ADLT (ELECTROSURGICAL) ×1 IMPLANT
FORCEPS BIPOLAR SPETZLER 8 1.0 (NEUROSURGERY SUPPLIES) IMPLANT
GAUZE 4X4 16PLY ~~LOC~~+RFID DBL (SPONGE) ×1 IMPLANT
GAUZE XEROFORM 5X9 LF (GAUZE/BANDAGES/DRESSINGS) IMPLANT
GLOVE BIO SURGEON STRL SZ7.5 (GLOVE) ×1 IMPLANT
GLOVE BIOGEL PI IND STRL 8 (GLOVE) ×1 IMPLANT
GOWN STRL REUS W/ TWL LRG LVL3 (GOWN DISPOSABLE) ×1 IMPLANT
GOWN STRL REUS W/ TWL XL LVL3 (GOWN DISPOSABLE) ×1 IMPLANT
GOWN STRL REUS W/TWL LRG LVL3 (GOWN DISPOSABLE) ×1
GOWN STRL REUS W/TWL XL LVL3 (GOWN DISPOSABLE) ×1
GUARD TEETH (MISCELLANEOUS) IMPLANT
HOLDER TRACH TUBE VELCRO 19.5 (MISCELLANEOUS) IMPLANT
KIT BASIN OR (CUSTOM PROCEDURE TRAY) ×1 IMPLANT
KIT SUCTION CATH 14FR (SUCTIONS) IMPLANT
KIT TURNOVER KIT B (KITS) ×1 IMPLANT
NDL HYPO 25GX1X1/2 BEV (NEEDLE) ×1 IMPLANT
NDL PRECISIONGLIDE 27X1.5 (NEEDLE) IMPLANT
NEEDLE HYPO 25GX1X1/2 BEV (NEEDLE) ×1 IMPLANT
NEEDLE PRECISIONGLIDE 27X1.5 (NEEDLE) ×1 IMPLANT
NS IRRIG 1000ML POUR BTL (IV SOLUTION) ×1 IMPLANT
PAD ARMBOARD 7.5X6 YLW CONV (MISCELLANEOUS) ×2 IMPLANT
PATTIES SURGICAL .5 X3 (DISPOSABLE) IMPLANT
PENCIL SMOKE EVACUATOR (MISCELLANEOUS) ×1 IMPLANT
SOL ANTI FOG 6CC (MISCELLANEOUS) IMPLANT
SPONGE DRAIN TRACH 4X4 STRL 2S (GAUZE/BANDAGES/DRESSINGS) ×1 IMPLANT
SPONGE INTESTINAL PEANUT (DISPOSABLE) ×1 IMPLANT
STAPLER VISISTAT 35W (STAPLE) ×1 IMPLANT
SUT CHROMIC 2 0 SH (SUTURE) ×1 IMPLANT
SUT ETHILON 2 0 FS 18 (SUTURE) ×2 IMPLANT
SUT SILK 2 0 PERMA HAND 18 BK (SUTURE) ×1 IMPLANT
SUT SILK 3 0 REEL (SUTURE) ×1 IMPLANT
SUT VIC AB 3-0 SH 27 (SUTURE) ×1
SUT VIC AB 3-0 SH 27XBRD (SUTURE) IMPLANT
SYR 20ML LL LF (SYRINGE) ×1 IMPLANT
SYR BULB IRRIG 60ML STRL (SYRINGE) IMPLANT
SYR CONTROL 10ML LL (SYRINGE) ×1 IMPLANT
TOWEL GREEN STERILE (TOWEL DISPOSABLE) ×1 IMPLANT
TRAY ENT MC OR (CUSTOM PROCEDURE TRAY) ×1 IMPLANT
TUBE CONNECTING 12X1/4 (SUCTIONS) ×1 IMPLANT
TUBE TRACH 6.0 CUFF FLEX (MISCELLANEOUS) IMPLANT
WATER STERILE IRR 1000ML POUR (IV SOLUTION) ×1 IMPLANT

## 2022-11-28 NOTE — Progress Notes (Signed)
Post-op from tracheostomy -Can't get PTA methadone due to being NPO; ordered dilaudid PRN Q2h PRN until able to take enteral meds -CXR ordered -SLP consult for swallow & PMV evaluation tomorrow. If not cleared to swallow, would need cortrak tomorrow.  -Switched steroids back to daily since this wasn't stridor 2/2 VC edema -Need to keep bronchodilators nebulized; should go home on this since she will not have reliable absorption of DPI past her trach  -Monitor in ICU post-trach tonight, can transfer out of ICU tomorrow if doing well from a trach standpoint. D/w ENT.  Trach change by ENT planned on 10.4.24. -Path pending   Steffanie Dunn, DO 11/28/22 1:55 PM [facility]  For contact information, see Amion. If no response to pager, please call PCCM consult pager. After hours, 7PM- 7AM, please call Elink.

## 2022-11-28 NOTE — Progress Notes (Signed)
NAME:  Meghan Contreras, MRN:  [id number], DOB:  1965-11-12, LOS: 1 ADMISSION DATE:  11/27/2022, CONSULTATION DATE: 9/28 REFERRING MD:  Pilar Plate, CHIEF COMPLAINT: Respiratory failure  History of Present Illness:  57 year old female patient with history as noted below most significantly with history of possible emphysema, ongoing tobacco abuse, and vocal cord injury after thyroid surgery.  Called EMS with acute onset of shortness of breath Initial history not available apparently received bronchodilator en route, had progressive decline in spite of bronchodilator to the point where she was requiring manual mask ventilation she was intubated shortly after arrival to the emergency room ER evaluation: White blood cell count 7.2 hemoglobin 12.6 INR 1.1 BUN/creatinine unremarkable BNP 525, lactate 0.6 venous blood gas pH 7.17 pCO2 of 98, \HCO3 35.  CT of chest showed bilateral lower lobe consolidation without pulmonary emboli, CT head unremarkable Interventions in the ER included: In addition to mechanical ventilation she was started on empiric antibiotics, IV hydration, and scheduled bronchodilators Pulmonary asked to admit.  Pertinent  Medical History  Vocal cord paralysis felt secondary to prior thyroid surgery Possible COPD Tobacco abuse Peripheral edema Hypothyroidism Anemia hyperlipidemia hyperglycemia hearing deficits  Significant Hospital Events: Including procedures, antibiotic start and stop dates in addition to other pertinent events   9/28 admitted with acute hypoxic and hypercarbic respiratory failure secondary to pneumonia intubated and ER.  Started on empiric antibiotics  Interim History / Subjective:  Awake, writing to answer questions, denies SOB.   Objective   Blood pressure 128/81, pulse (!) 56, temperature 97.8 F (36.6 C), temperature source Oral, resp. rate (!) 22, height 5\' 1"  (1.549 m), weight 84.1 kg, SpO2 96%.    Vent Mode: PSV;CPAP FiO2 (%):  [40 %] 40 % Set Rate:  [22  bmp] 22 bmp Vt Set:  [400 mL] 400 mL PEEP:  [5 cmH20] 5 cmH20 Pressure Support:  [5 cmH20] 5 cmH20 Plateau Pressure:  [19 cmH20-21 cmH20] 20 cmH20   Intake/Output Summary (Last 24 hours) at 11/28/2022 0901 Last data filed at 11/28/2022 0651 Gross per 24 hour  Intake 3287.68 ml  Output 2940 ml  Net 347.68 ml   Filed Weights   11/27/22 0100 11/28/22 0500  Weight: 83.9 kg 84.1 kg    Examination: General: critically ill appearing woman sitting up in bed in NAD HENT:  [place]/AT, eyes anicteric. Copious oral secretions/  Lungs: breathing comfortably on 5/5, mild thick secretions from ETT. No wheezing/ Cardiovascular: S1S2, RRR Abdomen: soft, NT Extremities: no significant peripheral edema, no cyanosis Neuro: RASS +1, nodding to answer quesitons Derm: warm, dry, no rashes  7.46/39/70/27 CXR personally reviewed> ETT 2.5 cm from carina, persistent LLL infiltrate silhouetting R hemidiaphragm  No AM labs; reviewed 9/28 labs- no AKI o/ electrolyte abnormalities or significant CBC abnormalities.  Resolved Hospital Problem list     Assessment & Plan:  Acute hypoxic and hypercarbic respiratory failure secondary to pneumonia and acute COPD exacerbation possibly exacerbated further by history of VCD at presentation> unable to assess while intubated -LTVV -VAP prevention protocol -PAD protocol for sedation -SAT & SBT; hopeful for extubation today -Con't levofloxacin; has reported allergy to B-lactams. Watch Qtc. -steroids- decreased to daily 40mg  -brovana, yupelri -echo never ordered at admission; with knowledge of her pneumonia, not sure that it is needed -d/c maintenance fluids -recommend quitting smoking; nicotine patch -recommend referral for lung cancer screening at discharge  Hyperglycemia -SSI PRN -goal BG 140-180  History of hypothyroidism -synthroid  History of IDA-resolved -no inpatient workup or treatment required  Best Practice (right click and "Reselect all  SmartList Selections" daily)   Diet/type: tubefeeds DVT prophylaxis: LMWH GI prophylaxis: H2B Lines: N/A Foley:  removal ordered  Code Status:  full code Last date of multidisciplinary goals of care discussion [pending ]  Labs   CBC: Recent Labs  Lab 11/27/22 0046 11/27/22 0057 11/27/22 0119 11/27/22 0258 11/27/22 0328 11/27/22 1143  WBC 7.2  --   --  8.5  --   --   NEUTROABS 4.6  --   --   --   --   --   HGB 12.6 15.0 12.9 13.5 14.3 13.9  HCT 43.1 44.0 38.0 44.4 42.0 41.0  MCV 83.7  --   --  83.3  --   --   PLT 244  --   --  237  --   --     Basic Metabolic Panel: Recent Labs  Lab 11/27/22 0046 11/27/22 0057 11/27/22 0119 11/27/22 0258 11/27/22 0328 11/27/22 1143  NA 139 141 142 136 140 139  K 4.4 4.3 4.0 4.1 4.1 4.0  CL 100  --   --  100  --   --   CO2 32  --   --  28  --   --   GLUCOSE 142*  --   --  146*  --   --   BUN 22*  --   --  20  --   --   CREATININE 1.00  --   --  0.93  --   --   CALCIUM 8.6*  --   --  7.9*  --   --      Critical care time:      This patient is critically ill with multiple organ system failure which requires frequent high complexity decision making, assessment, support, evaluation, and titration of therapies. This was completed through the application of advanced monitoring technologies and extensive interpretation of multiple databases. During this encounter critical care time was devoted to patient care services described in this note for 36 minutes.   Steffanie Dunn, DO 11/28/22 9:10 AM [facility]  For contact information, see Amion. If no response to pager, please call PCCM consult pager. After hours, 7PM- 7AM, please call Elink.

## 2022-11-28 NOTE — Progress Notes (Signed)
eLink Physician-Brief Progress Note Patient Name: Meghan Contreras DOB: Jan 04, 1966 MRN: [id number]   Date of Service  11/28/2022  HPI/Events of Note  Pt increasingly anxious, squirming in bed.  Cannot give home seroquel as pt has yet to pass her swallow evaluation.   eICU Interventions  PRN ativan IV ordered.  Will follow response.         Kirstina Leinweber M DELA CRUZ 11/28/2022, 9:16 PM

## 2022-11-28 NOTE — Consult Note (Signed)
ENT CONSULT:  Reason for Consult: Stridor  Referring Physician:  Karie Fetch DO Pulm/Critical care  HPI: Meghan Contreras is an 57 y.o. female with a history of prior thyroidectomy (11/2017, [facility]) with associated vocal cord paralysis, opiate abuse/dependence, COPD, intubated after arrival to ED yesterday for respiratory distress. Extubated this AM around 0930 by ICU team. Noted persistent stridor. ENT consulted for evaluation.  Patient endorses shortness of breath/dysphonia worsening last few months. She smokes tobacco/cigarettes to relieve her symptoms. Life longe tobacco abuse.   On nasal cannula supplemental O2.    Past Medical History:  Diagnosis Date   Anemia    Anxiety    Bipolar disorder (HCC)    COPD (chronic obstructive pulmonary disease) (HCC)    Depression    Elevated brain natriuretic peptide (BNP) level    Elevated serum creatinine    HOH (hard of hearing)    Hyperglycemia    Hyperlipidemia 2021   Hyperthyroidism    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  Old records from [facility] with CT of soft tissue of chest and neck 11/2017:  No findings to suggest cause of stridorous breathing and no obvious lung disease.     Tobacco dependence     Past Surgical History:  Procedure Laterality Date   ROBOTIC ASSISTED BILATERAL SALPINGO OOPHERECTOMY N/A 01/08/2022   Procedure: XI ROBOTIC ASSISTED BILATERAL SALPINGO OOPHORECTOMY;  Surgeon: Carver Fila, MD;  Location: [facility];  Service: Gynecology;  Laterality: N/A;   THYROIDECTOMY  2019    Family History  Problem Relation Age of Onset   HIV/AIDS Mother        from drug abuse   Drug abuse Mother    Drug abuse Brother    Hepatitis C Brother        From IV drug  abuse   Drug abuse Sister    Drug abuse Sister    Drug abuse Sister    Drug abuse Sister    Drug abuse Sister    Cancer Sister        lung   Drug abuse Sister     Social History:  reports that she has been smoking cigarettes. She has a 10.5 pack-year smoking history. She has been exposed to tobacco smoke. She has never used smokeless tobacco. She reports that she does not currently use drugs. She reports that she does not drink alcohol.  Allergies:  Allergies  Allergen Reactions   Amoxicillin Swelling and Rash   Latex Hives and Swelling   Penicillins Other (See Comments)    Facial swelling    Medications: I have reviewed the patient's current medications.  Results for orders placed or performed during the hospital encounter of 11/27/22 (from the past 48 hour(s))  Comprehensive metabolic panel     Status: Abnormal   Collection Time: 11/27/22 12:46 AM  Result Value Ref Range   Sodium 139 135 - 145 mmol/L   Potassium 4.4 3.5 - 5.1 mmol/L   Chloride 100 98 - 111 mmol/L   CO2 32 22 - 32 mmol/L   Glucose, Bld 142 (H) 70 - 99 mg/dL    Comment: Glucose reference range applies only to samples taken after fasting for  at least 8 hours.   BUN 22 (H) 6 - 20 mg/dL   Creatinine, Ser 1.61 0.44 - 1.00 mg/dL   Calcium 8.6 (L) 8.9 - 10.3 mg/dL   Total Protein 7.5 6.5 - 8.1 g/dL   Albumin 3.5 3.5 - 5.0 g/dL   AST 23 15 - 41 U/L   ALT 18 0 - 44 U/L   Alkaline Phosphatase 67 38 - 126 U/L   Total Bilirubin 0.8 0.3 - 1.2 mg/dL   GFR, Estimated >09 >60 mL/min    Comment: (NOTE) Calculated using the CKD-EPI Creatinine Equation (2021)    Anion gap 7 5 - 15    Comment: Performed at [facility], [address] [place], [place] [address]  CBC with Differential     Status: Abnormal   Collection Time: 11/27/22 12:46 AM  Result Value Ref Range   WBC 7.2 4.0 - 10.5 K/uL   RBC 5.15 (H) 3.87 - 5.11 MIL/uL   Hemoglobin 12.6 12.0 - 15.0 g/dL   HCT 81.1 91.4 - 78.2 %   MCV 83.7 80.0 - 100.0  fL   MCH 24.5 (L) 26.0 - 34.0 pg   MCHC 29.2 (L) 30.0 - 36.0 g/dL   RDW 95.6 (H) 21.3 - 08.6 %   Platelets 244 150 - 400 K/uL   nRBC 0.6 (H) 0.0 - 0.2 %   Neutrophils Relative % 64 %   Neutro Abs 4.6 1.7 - 7.7 K/uL   Lymphocytes Relative 32 %   Lymphs Abs 2.3 0.7 - 4.0 K/uL   Monocytes Relative 3 %   Monocytes Absolute 0.2 0.1 - 1.0 K/uL   Eosinophils Relative 0 %   Eosinophils Absolute 0.0 0.0 - 0.5 K/uL   Basophils Relative 1 %   Basophils Absolute 0.1 0.0 - 0.1 K/uL   nRBC 1 (H) 0 /100 WBC   Abs Immature Granulocytes 0.00 0.00 - 0.07 K/uL   Polychromasia PRESENT     Comment: Performed at [facility], [address], [place], [place] [address]  Protime-INR     Status: None   Collection Time: 11/27/22 12:46 AM  Result Value Ref Range   Prothrombin Time 14.1 11.4 - 15.2 seconds   INR 1.1 0.8 - 1.2    Comment: (NOTE) INR goal varies based on device and disease states. Performed at [facility], [address] [place], [place] [address]   APTT     Status: None   Collection Time: 11/27/22 12:46 AM  Result Value Ref Range   aPTT 31 24 - 36 seconds    Comment: Performed at [facility], [address] [place], [place] [address]  Blood Culture (routine x 2)     Status: None (Preliminary result)   Collection Time: 11/27/22 12:46 AM   Specimen: BLOOD RIGHT HAND  Result Value Ref Range   Specimen Description BLOOD RIGHT HAND    Special Requests      BOTTLES DRAWN AEROBIC AND ANAEROBIC Blood Culture adequate volume   Culture      NO GROWTH 1 DAY Performed at [facility], [address], [place], [place] [address]    Report Status PENDING   Troponin I (High Sensitivity)     Status: None   Collection Time: 11/27/22 12:46 AM  Result Value Ref Range   Troponin I (High Sensitivity) 14 <18 ng/L    Comment: (NOTE) Elevated high sensitivity troponin I (hsTnI) values and significant  changes across serial  measurements may suggest ACS but many other   chronic and acute conditions are known to elevate hsTnI results.  Refer to the "Links" section for chest pain algorithms and additional  guidance. Performed at [facility], [address], [place], [place] [address]   Brain natriuretic peptide     Status: Abnormal   Collection Time: 11/27/22 12:46 AM  Result Value Ref Range   B Natriuretic Peptide 525.6 (H) 0.0 - 100.0 pg/mL    Comment: Performed at [facility], [address], [place], [place] [address]  I-Stat Lactic Acid, ED     Status: None   Collection Time: 11/27/22 12:56 AM  Result Value Ref Range   Lactic Acid, Venous 0.6 0.5 - 1.9 mmol/L  I-Stat venous blood gas, ED     Status: Abnormal   Collection Time: 11/27/22 12:57 AM  Result Value Ref Range   pH, Ven 7.165 (LL) 7.25 - 7.43   pCO2, Ven 97.8 (HH) 44 - 60 mmHg   pO2, Ven 81 (H) 32 - 45 mmHg   Bicarbonate 35.3 (H) 20.0 - 28.0 mmol/L   TCO2 38 (H) 22 - 32 mmol/L   O2 Saturation 91 %   Acid-Base Excess 3.0 (H) 0.0 - 2.0 mmol/L   Sodium 141 135 - 145 mmol/L   Potassium 4.3 3.5 - 5.1 mmol/L   Calcium, Ion 1.16 1.15 - 1.40 mmol/L   HCT 44.0 36.0 - 46.0 %   Hemoglobin 15.0 12.0 - 15.0 g/dL   Sample type VENOUS    Comment NOTIFIED PHYSICIAN   Resp panel by RT-PCR (RSV, Flu A&B, Covid) Anterior Nasal Swab     Status: None   Collection Time: 11/27/22  1:00 AM   Specimen: Anterior Nasal Swab  Result Value Ref Range   SARS Coronavirus 2 by RT PCR NEGATIVE NEGATIVE   Influenza A by PCR NEGATIVE NEGATIVE   Influenza B by PCR NEGATIVE NEGATIVE    Comment: (NOTE) The Xpert Xpress SARS-CoV-2/FLU/RSV plus assay is intended as an aid in the diagnosis of influenza from Nasopharyngeal swab specimens and should not be used as a sole basis for treatment. Nasal washings and aspirates are unacceptable for Xpert Xpress SARS-CoV-2/FLU/RSV testing.  Fact Sheet for Patients: BloggerCourse.com  Fact Sheet for Healthcare  Providers: SeriousBroker.it  This test is not yet approved or cleared by the [place] FDA and has been authorized for detection and/or diagnosis of SARS-CoV-2 by FDA under an Emergency Use Authorization (EUA). This EUA will remain in effect (meaning this test can be used) for the duration of the COVID-19 declaration under Section 564(b)(1) of the Act, 21 U.S.C. section 360bbb-3(b)(1), unless the authorization is terminated or revoked.     Resp Syncytial Virus by PCR NEGATIVE NEGATIVE    Comment: (NOTE) Fact Sheet for Patients: BloggerCourse.com  Fact Sheet for Healthcare Providers: SeriousBroker.it  This test is not yet approved or cleared by the [place] FDA and has been authorized for detection and/or diagnosis of SARS-CoV-2 by FDA under an Emergency Use Authorization (EUA). This EUA will remain in effect (meaning this test can be used) for the duration of the COVID-19 declaration under Section 564(b)(1) of the Act, 21 U.S.C. section 360bbb-3(b)(1), unless the authorization is terminated or revoked.  Performed at [facility], [address] [place], [place] [address]   Blood Culture (routine x 2)     Status: None (Preliminary result)   Collection Time: 11/27/22  1:04 AM   Specimen: BLOOD LEFT  HAND  Result Value Ref Range   Specimen Description BLOOD LEFT HAND    Special Requests      BOTTLES DRAWN AEROBIC AND ANAEROBIC Blood Culture adequate volume   Culture      NO GROWTH 1 DAY Performed at [facility], [address], [place], [place] [address]    Report Status PENDING   I-Stat arterial blood gas, ED [facility], [facility])     Status: Abnormal   Collection Time: 11/27/22  1:19 AM  Result Value Ref Range   pH, Arterial 7.271 (L) 7.35 - 7.45   pCO2 arterial 70.2 (HH) 32 - 48 mmHg   pO2, Arterial 265 (H) 83 - 108 mmHg   Bicarbonate 32.8 (H) 20.0 - 28.0 mmol/L   TCO2 35 (H)  22 - 32 mmol/L   O2 Saturation 100 %   Acid-Base Excess 3.0 (H) 0.0 - 2.0 mmol/L   Sodium 142 135 - 145 mmol/L   Potassium 4.0 3.5 - 5.1 mmol/L   Calcium, Ion 1.15 1.15 - 1.40 mmol/L   HCT 38.0 36.0 - 46.0 %   Hemoglobin 12.9 12.0 - 15.0 g/dL   Patient temperature 47.4 F    Sample type ARTERIAL    Comment NOTIFIED PHYSICIAN   Urinalysis, w/ Reflex to Culture (Infection Suspected) -Urine, Clean Catch     Status: Abnormal   Collection Time: 11/27/22  1:53 AM  Result Value Ref Range   Specimen Source URINE, CLEAN CATCH    Color, Urine STRAW (A) YELLOW   APPearance CLEAR CLEAR   Specific Gravity, Urine 1.030 1.005 - 1.030   pH 7.0 5.0 - 8.0   Glucose, UA NEGATIVE NEGATIVE mg/dL   Hgb urine dipstick NEGATIVE NEGATIVE   Bilirubin Urine NEGATIVE NEGATIVE   Ketones, ur NEGATIVE NEGATIVE mg/dL   Protein, ur NEGATIVE NEGATIVE mg/dL   Nitrite NEGATIVE NEGATIVE   Leukocytes,Ua NEGATIVE NEGATIVE   RBC / HPF 0-5 0 - 5 RBC/hpf   WBC, UA 0-5 0 - 5 WBC/hpf    Comment:        Reflex urine culture not performed if WBC <=10, OR if Squamous epithelial cells >5. If Squamous epithelial cells >5 suggest recollection.    Bacteria, UA NONE SEEN NONE SEEN   Squamous Epithelial / HPF 0-5 0 - 5 /HPF    Comment: Performed at [facility], [address], [place], [place] [address]  Rapid urine drug screen (hospital performed)     Status: None   Collection Time: 11/27/22  1:53 AM  Result Value Ref Range   Opiates NONE DETECTED NONE DETECTED   Cocaine NONE DETECTED NONE DETECTED   Benzodiazepines NONE DETECTED NONE DETECTED   Amphetamines NONE DETECTED NONE DETECTED   Tetrahydrocannabinol NONE DETECTED NONE DETECTED   Barbiturates NONE DETECTED NONE DETECTED    Comment: (NOTE) DRUG SCREEN FOR MEDICAL PURPOSES ONLY.  IF CONFIRMATION IS NEEDED FOR ANY PURPOSE, NOTIFY LAB WITHIN 5 DAYS.  LOWEST DETECTABLE LIMITS FOR URINE DRUG SCREEN Drug Class                     Cutoff (ng/mL) Amphetamine  and metabolites    1000 Barbiturate and metabolites    200 Benzodiazepine                 200 Opiates and metabolites        300 Cocaine and metabolites        300 THC  50 Performed at [facility], [address] [place], [place] [address]   Troponin I (High Sensitivity)     Status: None   Collection Time: 11/27/22  2:58 AM  Result Value Ref Range   Troponin I (High Sensitivity) 16 <18 ng/L    Comment: (NOTE) Elevated high sensitivity troponin I (hsTnI) values and significant  changes across serial measurements may suggest ACS but many other  chronic and acute conditions are known to elevate hsTnI results.  Refer to the "Links" section for chest pain algorithms and additional  guidance. Performed at [facility], [address], [place], [place] [address]   CBC     Status: Abnormal   Collection Time: 11/27/22  2:58 AM  Result Value Ref Range   WBC 8.5 4.0 - 10.5 K/uL   RBC 5.33 (H) 3.87 - 5.11 MIL/uL   Hemoglobin 13.5 12.0 - 15.0 g/dL   HCT 09.8 11.9 - 14.7 %   MCV 83.3 80.0 - 100.0 fL   MCH 25.3 (L) 26.0 - 34.0 pg   MCHC 30.4 30.0 - 36.0 g/dL   RDW 82.9 (H) 56.2 - 13.0 %   Platelets 237 150 - 400 K/uL   nRBC 0.0 0.0 - 0.2 %    Comment: Performed at [facility], [address], [place], [place] [address]  Basic metabolic panel     Status: Abnormal   Collection Time: 11/27/22  2:58 AM  Result Value Ref Range   Sodium 136 135 - 145 mmol/L   Potassium 4.1 3.5 - 5.1 mmol/L   Chloride 100 98 - 111 mmol/L   CO2 28 22 - 32 mmol/L   Glucose, Bld 146 (H) 70 - 99 mg/dL    Comment: Glucose reference range applies only to samples taken after fasting for at least 8 hours.   BUN 20 6 - 20 mg/dL   Creatinine, Ser 4.69 0.44 - 1.00 mg/dL   Calcium 7.9 (L) 8.9 - 10.3 mg/dL   GFR, Estimated >62 >95 mL/min    Comment: (NOTE) Calculated using the CKD-EPI Creatinine Equation (2021)    Anion gap 8 5 - 15    Comment: Performed at [facility], [address], [place], [place] [address]  MRSA Next Gen by PCR, Nasal     Status: None   Collection Time: 11/27/22  3:04 AM   Specimen: Nasal Mucosa; Nasal Swab  Result Value Ref Range   MRSA by PCR Next Gen NOT DETECTED NOT DETECTED    Comment: (NOTE) The GeneXpert MRSA Assay (FDA approved for NASAL specimens only), is one component of a comprehensive MRSA colonization surveillance program. It is not intended to diagnose MRSA infection nor to guide or monitor treatment for MRSA infections. Test performance is not FDA approved in patients less than 21 years old. Performed at [facility], [address], [place], [place] [address]   I-Stat arterial blood gas, ED     Status: Abnormal   Collection Time: 11/27/22  3:28 AM  Result Value Ref Range   pH, Arterial 7.383 7.35 - 7.45   pCO2 arterial 45.8 32 - 48 mmHg   pO2, Arterial 52 (L) 83 - 108 mmHg   Bicarbonate 27.6 20.0 - 28.0 mmol/L   TCO2 29 22 - 32 mmol/L   O2 Saturation 88 %   Acid-Base Excess 1.0 0.0 - 2.0 mmol/L   Sodium 140 135 - 145 mmol/L   Potassium 4.1 3.5 - 5.1  mmol/L   Calcium, Ion 1.11 (L) 1.15 - 1.40 mmol/L   HCT 42.0 36.0 - 46.0 %   Hemoglobin 14.3 12.0 - 15.0 g/dL   Patient temperature 09.8 F    Sample type ARTERIAL   CBG monitoring, ED     Status: Abnormal   Collection Time: 11/27/22  5:03 AM  Result Value Ref Range   Glucose-Capillary 132 (H) 70 - 99 mg/dL    Comment: Glucose reference range applies only to samples taken after fasting for at least 8 hours.  CBG monitoring, ED     Status: Abnormal   Collection Time: 11/27/22  9:54 AM  Result Value Ref Range   Glucose-Capillary 112 (H) 70 - 99 mg/dL    Comment: Glucose reference range applies only to samples taken after fasting for at least 8 hours.  Glucose, capillary     Status: Abnormal   Collection Time: 11/27/22 11:33 AM  Result Value Ref Range   Glucose-Capillary 108 (H) 70 - 99 mg/dL    Comment: Glucose reference range applies only  to samples taken after fasting for at least 8 hours.  I-STAT 7, (LYTES, BLD GAS, ICA, H+H)     Status: Abnormal   Collection Time: 11/27/22 11:43 AM  Result Value Ref Range   pH, Arterial 7.455 (H) 7.35 - 7.45   pCO2 arterial 39.0 32 - 48 mmHg   pO2, Arterial 70 (L) 83 - 108 mmHg   Bicarbonate 27.3 20.0 - 28.0 mmol/L   TCO2 28 22 - 32 mmol/L   O2 Saturation 94 %   Acid-Base Excess 3.0 (H) 0.0 - 2.0 mmol/L   Sodium 139 135 - 145 mmol/L   Potassium 4.0 3.5 - 5.1 mmol/L   Calcium, Ion 1.11 (L) 1.15 - 1.40 mmol/L   HCT 41.0 36.0 - 46.0 %   Hemoglobin 13.9 12.0 - 15.0 g/dL   Patient temperature 11.9 F    Collection site RADIAL, ALLEN'S TEST ACCEPTABLE    Drawn by RT    Sample type ARTERIAL   Glucose, capillary     Status: None   Collection Time: 11/27/22  4:31 PM  Result Value Ref Range   Glucose-Capillary 97 70 - 99 mg/dL    Comment: Glucose reference range applies only to samples taken after fasting for at least 8 hours.  Glucose, capillary     Status: Abnormal   Collection Time: 11/27/22  8:02 PM  Result Value Ref Range   Glucose-Capillary 128 (H) 70 - 99 mg/dL    Comment: Glucose reference range applies only to samples taken after fasting for at least 8 hours.  Glucose, capillary     Status: Abnormal   Collection Time: 11/27/22 11:26 PM  Result Value Ref Range   Glucose-Capillary 133 (H) 70 - 99 mg/dL    Comment: Glucose reference range applies only to samples taken after fasting for at least 8 hours.  Glucose, capillary     Status: Abnormal   Collection Time: 11/28/22  3:55 AM  Result Value Ref Range   Glucose-Capillary 118 (H) 70 - 99 mg/dL    Comment: Glucose reference range applies only to samples taken after fasting for at least 8 hours.  Triglycerides     Status: None   Collection Time: 11/28/22  6:09 AM  Result Value Ref Range   Triglycerides 110 <150 mg/dL    Comment: Performed at [facility], [address], [place], [place] [address]  Glucose, capillary      Status: Abnormal  Collection Time: 11/28/22  7:40 AM  Result Value Ref Range   Glucose-Capillary 123 (H) 70 - 99 mg/dL    Comment: Glucose reference range applies only to samples taken after fasting for at least 8 hours.    CT Angio Chest Pulmonary Embolism (PE) W or WO Contrast  Result Date: 11/27/2022 CLINICAL DATA:  Worsening shortness of breath EXAM: CT ANGIOGRAPHY CHEST WITH CONTRAST TECHNIQUE: Multidetector CT imaging of the chest was performed using the standard protocol during bolus administration of intravenous contrast. Multiplanar CT image reconstructions and MIPs were obtained to evaluate the vascular anatomy. RADIATION DOSE REDUCTION: This exam was performed according to the departmental dose-optimization program which includes automated exposure control, adjustment of the mA and/or kV according to patient size and/or use of iterative reconstruction technique. CONTRAST:  75mL OMNIPAQUE IOHEXOL 350 MG/ML SOLN COMPARISON:  Chest x-ray from earlier in the same day. FINDINGS: Cardiovascular: Thoracic aorta shows dilatation or dissection. Heart is mildly prominent. Pulmonary artery shows no filling defect to suggest pulmonary embolism. Mediastinum/Nodes: Thoracic inlet is within normal limits. Endotracheal tube and gastric catheter are noted in satisfactory position. The esophagus is within normal limits. Lungs/Pleura: Bilateral lower lobe consolidation is noted. No sizable effusion is seen. No parenchymal nodules are noted. Upper Abdomen: No acute abnormality. Musculoskeletal: No chest wall abnormality. No acute or significant osseous findings. Review of the MIP images confirms the above findings. IMPRESSION: Bilateral lower lobe consolidation. Electronically Signed   By: Alcide Clever M.D.   On: 11/27/2022 01:54   CT HEAD WO CONTRAST ( )  Result Date: 11/27/2022 CLINICAL DATA:  Delirium EXAM: CT HEAD WITHOUT CONTRAST TECHNIQUE: Contiguous axial images were obtained from the base of the  skull through the vertex without intravenous contrast. RADIATION DOSE REDUCTION: This exam was performed according to the departmental dose-optimization program which includes automated exposure control, adjustment of the mA and/or kV according to patient size and/or use of iterative reconstruction technique. COMPARISON:  None Available. FINDINGS: Brain: No evidence of acute infarction, hemorrhage, hydrocephalus, extra-axial collection or mass lesion/mass effect. Vascular: No hyperdense vessel or unexpected calcification. Skull: Normal. Negative for fracture or focal lesion. Sinuses/Orbits: No acute finding. Other: None. IMPRESSION: No acute intracranial abnormality noted. Electronically Signed   By: Alcide Clever M.D.   On: 11/27/2022 01:52   DG Chest Portable 1 View  Result Date: 11/27/2022 CLINICAL DATA:  Intubation, shortness of breath EXAM: PORTABLE CHEST 1 VIEW COMPARISON:  06/07/2022 FINDINGS: Endotracheal to is 3 cm above the carina. NG tube is in the stomach. Heart is borderline in size. Bilateral lower lobe airspace opacities. No visible effusions or pneumothorax. No acute bony abnormality. IMPRESSION: Bilateral lower lobe atelectasis or infiltrates. Electronically Signed   By: Charlett Nose M.D.   On: 11/27/2022 00:58    WUJ:[id number] other than stated per HPI  Blood pressure (!) 138/91, pulse 73, temperature 97.8 F (36.6 C), temperature source Oral, resp. rate 18, height 5\' 1"  (1.549 m), weight 84.1 kg, SpO2 100%.  PHYSICAL EXAM:  Resting in bed with mild resting stridor and dysphonia. Neck with prior thyroidectomy scar. Work of breathing is increased. Nasal cannula intact.   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 Informed  verbal consent was obtained after explaining the risks (including bleeding and infection), benefits and alternatives of the  procedure. Verbal timeout was performed prior to the procedure. The nose was topicalized with topical lidocaine/oxymetazoline. The flexible broncho scope was advanced through the left nasal cavity. The septum and turbinates appeared normal. The middle meatus was free of polyps of purulence. The eustachian tube, choana, and adenoids were normal in appearance. 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    The patient tolerated the procedure with no immediate complications.   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 Laryngoscopy in ICU demonstrates right vocal cord paralysis in paramedian position, and left vocal cord polyp vs. Tumor. Airway is critical.  I've recommended semi-urgent tracheostomy, DL biopsy to debulk and rule out cancer. Discussed RBA in detail with patient and her husband Ladene Artist. Discussed risk of prolonged trach dependence with associated changes with voice and swallowing. Risks include pain ,bleeding infection, scarring, numbness, tracheo-innominate fistula, tracheo-esophageal fistula, prolonged trach dependence, loss of voice, need for further treatment/radiation, risks of anesthesia.   Discussed with ICU Attending Dr. Chestine Spore who is in agreement with the plan.  OR activated. Plan for surgery around 12PM given tube feeds held at 8am. Plan for intubation with small ie 6-0 tube. Discussed with anesthesia.    I have personally spent 61 minutes involved in face-to-face and non-face-to-face activities for this patient on the day of the visit.  Professional time spent includes the following activities, in addition to those noted in the documentation: preparing to see the patient  (eg, review of tests), obtaining and/or reviewing separately obtained history, performing a medically appropriate examination and/or evaluation, counseling and educating the patient/family/caregiver, ordering medications, tests or procedures, referring and communicating with other healthcare professionals, documenting clinical information in the electronic or other health record, independently interpreting results and communicating results with the patient/family/caregiver, care coordination.  Electronically signed by:  Scarlette Ar, MD  Staff Physician Facial Plastic & Reconstructive Surgery Otolaryngology - Head and Neck Surgery [facility] [facility] - [facility]   11/28/2022, 10:02 AM

## 2022-11-28 NOTE — Transfer of Care (Signed)
Immediate Anesthesia Transfer of Care Note  Patient: Meghan Contreras  Procedure(s) Performed: DIRECT LARYNGOSCOPY TRACHEOSTOMY  Patient Location: PACU  Anesthesia Type:General  Level of Consciousness: drowsy and patient cooperative  Airway & Oxygen Therapy: Patient Spontanous Breathing  Post-op Assessment: Report given to RN and Post -op Vital signs reviewed and stable  Post vital signs: Reviewed and stable  Last Vitals:  Vitals Value Taken Time  BP 162/98 11/28/22 1337  Temp    Pulse 76 11/28/22 1345  Resp 14 11/28/22 1345  SpO2 96 % 11/28/22 1345  Vitals shown include unfiled device data.  Last Pain:  Vitals:   11/28/22 1135  TempSrc: Oral         Complications: No notable events documented.

## 2022-11-28 NOTE — Anesthesia Procedure Notes (Signed)
Procedure Name: Intubation Date/Time: 11/28/2022 12:30 PM  Performed by: Zollie Beckers, CRNAPre-anesthesia Checklist: Patient identified, Emergency Drugs available, Suction available and Patient being monitored Patient Re-evaluated:Patient Re-evaluated prior to induction Oxygen Delivery Method: Circle system utilized Preoxygenation: Pre-oxygenation with 100% oxygen Induction Type: IV induction and Rapid sequence Ventilation: Mask ventilation without difficulty Laryngoscope Size: 3 and Glidescope Grade View: Grade I Tube type: Oral Tube size: 6.0 mm Number of attempts: 1 Airway Equipment and Method: Rigid stylet and Video-laryngoscopy Placement Confirmation: ETT inserted through vocal cords under direct vision, positive ETCO2 and breath sounds checked- equal and bilateral Secured at: 22 cm Tube secured with: Tape Dental Injury: Teeth and Oropharynx as per pre-operative assessment

## 2022-11-28 NOTE — Progress Notes (Signed)
Passed SBT, extubated to [place]. Had cuff leak, has been on steroids.  Has some post-extubation stridor she says is persistent but slightly worse from her baseline. She has a history of tobacco abuse and feels like smoking opens up her airways. She currently denies SOB.   Racemic epi now & Q1h PRN Keep steroids BID ENT consulted Neck soft tissue CT ordered Close monitoring with reintubation if she develops respiratory distress.  Steffanie Dunn, DO 11/28/22 9:47 AM [facility]  For contact information, see Amion. If no response to pager, please call PCCM consult pager. After hours, 7PM- 7AM, please call Elink.

## 2022-11-28 NOTE — Procedures (Signed)
Extubation Procedure Note  Patient Details:   Name: Meghan Contreras DOB: [birth year] MRN: [id number]   Airway Documentation:    Vent end date: (not recorded) Vent end time: (not recorded)   Evaluation  O2 sats: stable throughout Complications: No apparent complications Patient did tolerate procedure well. Bilateral Breath Sounds: Stridor, Diminished   Yes  Mollee Neer 11/28/2022, 9:47 AM Pt extubated to 6LPM [place].  Pt developed stridor and was given a racemic.  Pt tolerated well.  Pt has a 'valve flap' sound in upper airway.  RN and MD aware.

## 2022-11-28 NOTE — Anesthesia Preprocedure Evaluation (Addendum)
Anesthesia Evaluation  Patient identified by MRN, date of birth, ID band Patient awake    Reviewed: Allergy & Precautions, NPO status , Patient's Chart, lab work & pertinent test results  History of Anesthesia Complications Negative for: history of anesthetic complications  Airway Mallampati: II  TM Distance: >3 FB Neck ROM: Full    Dental  (+) Edentulous Upper, Poor Dentition, Chipped, Missing, Dental Advisory Given   Pulmonary shortness of breath, COPD,  COPD inhaler, Current Smoker and Patient abstained from smoking. 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: stridor Stridor is becoming severe, required intubation yesterday, pt self extubated and now for re-intubation with trach placement   breath sounds clear to auscultation  + stridor     Cardiovascular negative cardio ROS  Rhythm:Regular Rate:Normal     Neuro/Psych  PSYCHIATRIC DISORDERS (PTSD) Anxiety Depression Bipolar Disorder   negative neurological ROS     GI/Hepatic ,,,(+)     substance abuse (methadone)  Tube feeding D/C 8am   Endo/Other  Hypothyroidism    Renal/GU Renal InsufficiencyRenal disease     Musculoskeletal  (+)  narcotic dependent  Abdominal   Peds  Hematology negative hematology ROS (+)   Anesthesia Other Findings   Reproductive/Obstetrics                             Anesthesia Physical Anesthesia Plan  ASA: 3  Anesthesia Plan: General   Post-op Pain Management: Ofirmev IV (intra-op)*   Induction: Intravenous  PONV Risk Score and Plan: 2 and Ondansetron and Treatment may vary due to age or medical condition  Airway Management Planned: Oral ETT, Tracheostomy and Video Laryngoscope Planned  Additional Equipment: None  Intra-op Plan:   Post-operative Plan: Possible Post-op intubation/ventilation  Informed Consent: I have reviewed the patients History and Physical, chart, labs and discussed the  procedure including the risks, benefits and alternatives for the proposed anesthesia with the patient or authorized representative who has indicated his/her understanding and acceptance.     Dental advisory given  Plan Discussed with: CRNA and Surgeon  Anesthesia Plan Comments: (Emergent, planning on trach collar O2 post op )        Anesthesia Quick Evaluation

## 2022-11-28 NOTE — Op Note (Signed)
OPERATIVE NOTE  Meghan Contreras Date/Time of Admission: 11/27/2022 12:33 AM  CSN: [id number];MWU:[id number] Attending Provider: Steffanie Dunn, DO Room/Bed: MCPO/NONE DOB: 1966/01/18 Age: 57 y.o.   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 No other laryngeal, oropharyngeal or hypopharyngeal lesions. Anterior neck with dense scar tissue and strap musculature scarred to trachea. Bjork flap between tracheal rings 2-3.   Description of Operation:  The patient was brought to the operating room on 11/28/2022 and placed in supine position on the operating table. General endotracheal anesthesia was established without difficulty. When the patient was adequately anesthetized, surgical timeout was performed and correct identification of the patient and the surgical procedure. The patient was positioned and prepped and draped in sterile fashion.  A laryngoscope was used to examine the patient's oral cavity, oropharynx and larynx.  Findings include left glottic mass. A biopsy was taken. Hemostasis achieved with 1:1000 epinephrine soaked cottonoid pledgets.  There was no significant bleeding. Due to vocal cord immobility on the right side and concern for worsening enlargement of this laryngeal mass and possible need for radiation therapy if malignancy identified, the patient consented to tracheostomy for airway safety.  The patient was secured on the bed and appropriately positioned to maximize neck  exposure using a shoulder roll and silk tape to tape back the chin and the upper chest adipose tissue.  A 3 cm incision was placed approximately 2 fingerbreadths above the sternal notch and infiltrated with lidocaine epinephrine.  The patient was prepped and draped in standard fashion for a procedure of this kind.  A final preoperative pause was performed and we proceeded with surgery.  15 blade was used to create a skin incision.  The central neck adipose tissue was defatted with Bovie and forceps.  Next a vertical incision was created through the superficial cervical fascia until strap musculature was encountered.  The strap musculature raphae was divided and [employer] retractors were used to retract the strap muscles.  Next the cricoid cartilage was encountered.  FiO2 was decreased.  Bovie was used to incise down to the cricoid bipolar cautery and blunt dissection used to release scar tissue/strap muscles off the trachea - sequelae from prior thyroidectomy.  Tracheal rings 1-3 exposed.  Arm[employer] retractors were applied for good exposure.  We began with preparation for an inferiorly based Bjork flap tracheostomy.  The patient's ET tube was advanced further down to avoid violating the cuff.  Next a 15 blade scalpel was used to incise tracheal rings 2 and 3 to create a inferiorly based Bjork flap 1 ring and height.  Curved Metzenbaum scissors were used to create the vertical cuts laterally.  A 3-0 Vicryl stitch on a taper needle was used to secure the port flap to the skin.  After this was done the ET tube was withdrawn and the Shiley 6-0 cuffed tracheostomy tube was inserted without difficulty.  The obturator was removed and an inner cannula placed.  The cuff was inflated and the tube was attached to the endotracheal tube circuit.  End-tidal CO2 was  confirmed.  The tracheostomy tube was secured with 2-0 silk sutures as well as a tracheostomy tie. The patient was then turned back to the anesthetist brought her  back to the ICU in stable condition.  Plan: -The patient will return to the cardiac ICU and will have a tracheostomy change performed by ENT on postoperative day 5 - Post-op CXR - F/u surgical pathology   Mervin Kung, MD [facility]  11/28/2022

## 2022-11-29 ENCOUNTER — Encounter (HOSPITAL_COMMUNITY): Payer: Self-pay | Admitting: Otolaryngology

## 2022-11-29 DIAGNOSIS — J189 Pneumonia, unspecified organism: Secondary | ICD-10-CM | POA: Diagnosis not present

## 2022-11-29 DIAGNOSIS — J9602 Acute respiratory failure with hypercapnia: Secondary | ICD-10-CM | POA: Diagnosis not present

## 2022-11-29 DIAGNOSIS — J441 Chronic obstructive pulmonary disease with (acute) exacerbation: Secondary | ICD-10-CM | POA: Diagnosis not present

## 2022-11-29 LAB — CBC
HCT: 42.9 % (ref 36.0–46.0)
Hemoglobin: 13.5 g/dL (ref 12.0–15.0)
MCH: 24.6 pg — ABNORMAL LOW (ref 26.0–34.0)
MCHC: 31.5 g/dL (ref 30.0–36.0)
MCV: 78.1 fL — ABNORMAL LOW (ref 80.0–100.0)
Platelets: 247 10*3/uL (ref 150–400)
RBC: 5.49 MIL/uL — ABNORMAL HIGH (ref 3.87–5.11)
RDW: 21.4 % — ABNORMAL HIGH (ref 11.5–15.5)
WBC: 10.5 10*3/uL (ref 4.0–10.5)
nRBC: 0 % (ref 0.0–0.2)

## 2022-11-29 LAB — GLUCOSE, CAPILLARY
Glucose-Capillary: 108 mg/dL — ABNORMAL HIGH (ref 70–99)
Glucose-Capillary: 76 mg/dL (ref 70–99)
Glucose-Capillary: 94 mg/dL (ref 70–99)

## 2022-11-29 LAB — MAGNESIUM: Magnesium: 2.1 mg/dL (ref 1.7–2.4)

## 2022-11-29 LAB — BASIC METABOLIC PANEL
Anion gap: 11 (ref 5–15)
BUN: 21 mg/dL — ABNORMAL HIGH (ref 6–20)
CO2: 27 mmol/L (ref 22–32)
Calcium: 8.6 mg/dL — ABNORMAL LOW (ref 8.9–10.3)
Chloride: 101 mmol/L (ref 98–111)
Creatinine, Ser: 0.92 mg/dL (ref 0.44–1.00)
GFR, Estimated: >60 mL/min (ref >60)
Glucose, Bld: 80 mg/dL (ref 70–99)
Potassium: 3.6 mmol/L (ref 3.5–5.1)
Sodium: 139 mmol/L (ref 135–145)

## 2022-11-29 LAB — PHOSPHORUS: Phosphorus: 3.7 mg/dL (ref 2.5–4.6)

## 2022-11-29 MED ORDER — ORAL CARE MOUTH RINSE
15.0000 mL | OROMUCOSAL | Status: DC
  Administered 2022-11-29 – 2022-12-07 (×27): 15 mL via OROMUCOSAL

## 2022-11-29 MED ORDER — POTASSIUM CHLORIDE 10 MEQ/100ML IV SOLN
10.0000 meq | INTRAVENOUS | Status: AC
  Administered 2022-11-29 (×3): 10 meq via INTRAVENOUS
  Filled 2022-11-29 (×4): qty 100

## 2022-11-29 MED ORDER — ORAL CARE MOUTH RINSE
15.0000 mL | OROMUCOSAL | Status: DC | PRN
  Administered 2022-12-02: 15 mL via OROMUCOSAL

## 2022-11-29 NOTE — Evaluation (Signed)
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  Old records from [facility] with CT of soft tissue of chest and neck 11/2017:  No findings to suggest cause of stridorous breathing and no obvious lung disease.     Tobacco dependence    Past Surgical History:  Past Surgical History:  Procedure Laterality Date   DIRECT LARYNGOSCOPY N/A 11/28/2022   Procedure: DIRECT LARYNGOSCOPY;  Surgeon: Scarlette Ar, MD;  Location: [facility] OR;  Service: ENT;  Laterality: N/A;   ROBOTIC ASSISTED BILATERAL SALPINGO OOPHERECTOMY N/A 01/08/2022   Procedure: XI ROBOTIC ASSISTED BILATERAL SALPINGO OOPHORECTOMY;  Surgeon: Carver Fila, MD;  Location: [facility] ORS;  Service: Gynecology;  Laterality: N/A;   THYROIDECTOMY  2019   TRACHEOSTOMY TUBE PLACEMENT N/A 11/28/2022   Procedure: TRACHEOSTOMY;  Surgeon: Scarlette Ar, MD;  Location: [facility] OR;  Service: ENT;  Laterality: N/A;   HPI:  Meghan Contreras is a 57 y.o. year-old female with a history of COPD presenting to the ED with chief complaint of respiratory distress. Intubated 9/28-9/29 but noted consistent stridor and ENT consulted. Laryngoscopy demonstrated tight vocal cord  paralysis in paramedian position and left vocal cord polyp vs tumor with recommendation of semi-urgent trach. on 9/29. PMH vocal cord paralysis due to prior thyroid surgery, possible COPD    Assessment / Plan / Recommendation  Clinical Impression  Pt tolerated cuff deflation well with expecotration of bloody secretions via trach.Valve remained on trach hub with pt coughing valve off hub  several times to expectorate secretions. Despite a righ paralyzed vocal cord and polyp vs tumor of left  her verbalizations were audible, intelligible and only slightly hoarse with good respiratory support. There was no signs of back pressure after she coughed several times and valve remained on trach hub for remainder of assessment. RR 16-19, SpO2 94-97% and HR 77. Recommend pt wear valve with staff and therapy with full supervision. Plan for FEES tomorrow. SLP Visit Diagnosis: Aphonia (R49.1)    SLP Assessment  Patient needs continued Speech Lanaguage Pathology Services    Recommendations for follow up therapy are one component of a multi-disciplinary discharge planning process, led by the attending physician.  Recommendations may be updated based on patient status, additional functional criteria and insurance authorization.  Follow Up Recommendations   (TBD)    Assistance Recommended at Discharge Frequent or constant Supervision/Assistance  Functional Status Assessment Patient has had a recent decline in their functional status and demonstrates the ability to make significant improvements in function in a reasonable and predictable amount of time.  Frequency and Duration min 2x/week  2  weeks    PMSV Trial PMSV was placed for: 23 min Able to redirect subglottic air through upper airway: Yes Able to Attain Phonation: Yes Voice Quality: Hoarse Able to Expectorate Secretions: Yes Level of Secretion Expectoration with PMSV: Tracheal;Oral Breath Support for Phonation: Mildly decreased Intelligibility:  Intelligible Respirations During Trial:  (16-19) SpO2 During Trial:  (94-97) Pulse During Trial: 77 Behavior: Alert;Controlled;Cooperative;Expresses self well;Responsive to questions   Tracheostomy Tube       Vent Dependency  FiO2 (%): 30 %    Cuff Deflation Trial Tolerated Cuff Deflation: Yes Length of Time for Cuff Deflation Trial:  (23 min and left deflated) Behavior: Alert;Controlled;Cooperative;Expresses self well;Responsive to questions         [name] 11/29/2022, 10:57 AM

## 2022-11-29 NOTE — Evaluation (Signed)
Occupational Therapy Evaluation Patient Details Name: Meghan Contreras MRN: [id number] DOB: April 21, 1965 Today's Date: 11/29/2022   History of Present Illness Pt is a 57 y/o female admitted 9/28 with acute onset of SOB with progressive decline in spite of bronchodilator use to finally intubated.  Tracheostomy 9/29.  PMHx, anxiety, Bipolar d/o, COPD, depression, HOH, opiate addiction, PTSD, tobacco dependence.   Clinical Impression   PTA, pt lives with spouse and typically independent with ADLs, household IADLs and mobility without AD. Pt presents now with minor deficits in cardiopulmonary endurance. Pt requires CGA for mobility without AD and Supervision for ADLs. Required increase in O2 to 8 L via trach collar to maintain sats. Husband at bedside, supportive and plans to ensure consistent assist for pt at DC. Anticipate no OT needs.      If plan is discharge home, recommend the following: Assistance with cooking/housework;A little help with bathing/dressing/bathroom    Functional Status Assessment  Patient has had a recent decline in their functional status and demonstrates the ability to make significant improvements in function in a reasonable and predictable amount of time.  Equipment Recommendations  None recommended by OT    Recommendations for Other Services       Precautions / Restrictions Precautions Precautions: Other (comment) Precaution Comments: Monitor O2, relatively low fall risk Restrictions Weight Bearing Restrictions: No      Mobility Bed Mobility Overal bed mobility: Needs Assistance Bed Mobility: Supine to Sit     Supine to sit: Contact guard     General bed mobility comments: HOB up, but no assist given    Transfers Overall transfer level: Needs assistance   Transfers: Sit to/from Stand Sit to Stand: Contact guard assist (from various heights)                  Balance Overall balance assessment: Needs assistance Sitting-balance support: No  upper extremity supported, Feet supported Sitting balance-Leahy Scale: Fair     Standing balance support: No upper extremity supported Standing balance-Leahy Scale: Fair                             ADL either performed or assessed with clinical judgement   ADL Overall ADL's : Needs assistance/impaired Eating/Feeding: NPO   Grooming: Supervision/safety;Standing;Wash/dry hands Grooming Details (indicate cue type and reason): standing at sink in bathroom Upper Body Bathing: Set up   Lower Body Bathing: Supervison/ safety;Sit to/from stand;Sitting/lateral leans   Upper Body Dressing : Set up;Sitting   Lower Body Dressing: Supervision/safety;Sitting/lateral leans;Sit to/from stand   Toilet Transfer: Contact guard assist;Supervision/safety;Ambulation Toilet Transfer Details (indicate cue type and reason): CGA to Supervision for bathroom mobility without AD Toileting- Clothing Manipulation and Hygiene: Supervision/safety;Sitting/lateral lean;Sit to/from stand       Functional mobility during ADLs: Supervision/safety;Contact guard assist       Vision Baseline Vision/History: 1 Wears glasses Ability to See in Adequate Light: 0 Adequate Patient Visual Report: No change from baseline Vision Assessment?: Wears glasses for reading     Perception         Praxis         Pertinent Vitals/Pain Pain Assessment Pain Assessment: No/denies pain Pain Intervention(s): Monitored during session     Extremity/Trunk Assessment Upper Extremity Assessment Upper Extremity Assessment: Overall WFL for tasks assessed;Right hand dominant   Lower Extremity Assessment Lower Extremity Assessment: Defer to PT evaluation   Cervical / Trunk Assessment Cervical / Trunk Assessment: Other exceptions (mild cervical flexion  due to new trach)   Communication Communication Communication: Tracheostomy;Other (comment) (has PMV but declined to use during session)   Cognition Arousal:  Alert Behavior During Therapy: WFL for tasks assessed/performed Overall Cognitive Status: Within Functional Limits for tasks assessed                                       General Comments  Husband at bedside and supportive    Exercises     Shoulder Instructions      Home Living Family/patient expects to be discharged to:: Private residence Living Arrangements: Spouse/significant other Available Help at Discharge: Family;Available 24 hours/day;Available PRN/intermittently Type of Home: House Home Access: Level entry     Home Layout: One level     Bathroom Shower/Tub: Tub/shower unit;Walk-in shower;Curtain   Bathroom Toilet: Handicapped height     Home Equipment: Grab bars - tub/shower          Prior Functioning/Environment Prior Level of Function : Independent/Modified Independent             Mobility Comments: no AD ADLs Comments: Indep with ADLs, spouse provides supervision for tub transfers (takes bubble baths), can manage IADLs in the home. Does not drive        OT Problem List: Cardiopulmonary status limiting activity;Impaired balance (sitting and/or standing);Decreased activity tolerance      OT Treatment/Interventions: Self-care/ADL training;Therapeutic exercise;Energy conservation;DME and/or AE instruction;Therapeutic activities;Patient/family education;Balance training    OT Goals(Current goals can be found in the care plan section) Acute Rehab OT Goals Patient Stated Goal: stop smoking, home when ready, have family education for trach care OT Goal Formulation: With patient/family Time For Goal Achievement: 12/13/22 Potential to Achieve Goals: Good  OT Frequency: Min 1X/week    Co-evaluation              AM-PAC OT "6 Clicks" Daily Activity     Outcome Measure Help from another person eating meals?: Total (NPO) Help from another person taking care of personal grooming?: A Little Help from another person toileting, which  includes using toliet, bedpan, or urinal?: A Little Help from another person bathing (including washing, rinsing, drying)?: A Little Help from another person to put on and taking off regular upper body clothing?: A Little Help from another person to put on and taking off regular lower body clothing?: A Little 6 Click Score: 16   End of Session Nurse Communication: Mobility status  Activity Tolerance: Patient tolerated treatment well Patient left: in chair;with call bell/phone within reach;with family/visitor present  OT Visit Diagnosis: Other abnormalities of gait and mobility (R26.89)                Time: 1137-1209 OT Time Calculation (min): 32 min Charges:  OT General Charges $OT Visit: 1 Visit OT Evaluation $OT Eval Moderate Complexity: 1 Mod  Bradd Canary, OTR/L Acute Rehab Services Office: [phone]   Lorre Munroe 11/29/2022, 12:49 PM

## 2022-11-29 NOTE — Progress Notes (Signed)
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, intubated for hypoxemic respiratory failure due to right vocal cord paralysis, left vocal cord mass POD#1 (11/28/22) s/p tracheostomy, DL biopsy  Subjective: NAEON CXR without pneumothorax Breathing comfortably through tracheostomy Requesting methadone Daughter at bedside  Objective: Vital signs in last 24 hours: Temp:  [98 F (36.7 C)-99.1 F (37.3 C)] 98.1 F (36.7 C) (09/30 0857) Pulse Rate:  [67-101] 67 (09/30 0800) Resp:  [0-27] 17 (09/30 0800) BP: (126-174)/(84-137) 154/101 (09/30 0800) SpO2:  [93 %-100 %] 100 % (09/30 0805) FiO2 (%):  [30 %-40 %] 30 % (09/30 0805) Weight:  [84.1 kg] 84.1 kg (09/29 1145)  Physical exam: Resting in bed in NAD Shiley 6-0 tracheostomy secured with silk sutures and trach tie. Neck soft/supple.   @LABLAST2 (wbc:2,hgb:2,hct:2,plt:2) Recent Labs    11/27/22 0258 11/27/22 0328 11/27/22 1143 11/29/22 0342  NA 136   < > 139 139  K 4.1   < > 4.0 3.6  CL 100  --   --  101  CO2 28  --   --  27  GLUCOSE 146*  --   --  80  BUN 20  --   --  21*  CREATININE 0.93  --   --  0.92  CALCIUM 7.9*  --   --  8.6*   < > = values in this interval not displayed.    Medications: I have reviewed the patient's current medications.  Assessment/Plan: POD#1 s/p tracheostomy, DL biopsy. Doing well.   Recs: - Routine trach care - F/u surgical path - Work with discharge planning for planned discharge home with Shiley 4-0 cuffless tracheostomy tube; tracheostomy tube change on Friday 12/03/22. Establish care with [facility] - OK from ENT Standpoint to downgrade to medicine wards/hospitalist service   Dispo: ICU    LOS: 2 days   Scarlette Ar 11/29/2022, 9:33 AM  I have personally spent 12 minutes involved in face-to-face and  non-face-to-face activities for this patient on the day of the visit.  Professional time spent includes the following activities, in addition to those noted in the documentation: preparing to see the patient (eg, review of tests), obtaining and/or reviewing separately obtained history, performing a medically appropriate examination and/or evaluation, counseling and educating the patient/family/caregiver, ordering medications, tests or procedures, referring and communicating with other healthcare professionals, documenting clinical information in the electronic or other health record, independently interpreting results and communicating results with the patient/family/caregiver, care coordination.  Electronically signed by:  Scarlette Ar, MD  Staff Physician Facial Plastic & Reconstructive Surgery Otolaryngology - Head and Neck Surgery [facility] [place]

## 2022-11-29 NOTE — Progress Notes (Addendum)
NAME:  Meghan Contreras, MRN:  [id number], DOB:  12-Jan-1966, LOS: 2 ADMISSION DATE:  11/27/2022, CONSULTATION DATE: 9/28 REFERRING MD:  Pilar Plate, CHIEF COMPLAINT: Respiratory failure  History of Present Illness:  57 year old female patient with history as noted below most significantly with history of possible emphysema, ongoing tobacco abuse, and vocal cord injury after thyroid surgery.  Called EMS with acute onset of shortness of breath Initial history not available apparently received bronchodilator en route, had progressive decline in spite of bronchodilator to the point where she was requiring manual mask ventilation she was intubated shortly after arrival to the emergency room ER evaluation: White blood cell count 7.2 hemoglobin 12.6 INR 1.1 BUN/creatinine unremarkable BNP 525, lactate 0.6 venous blood gas pH 7.17 pCO2 of 98, \HCO3 35.  CT of chest showed bilateral lower lobe consolidation without pulmonary emboli, CT head unremarkable Interventions in the ER included: In addition to mechanical ventilation she was started on empiric antibiotics, IV hydration, and scheduled bronchodilators Pulmonary asked to admit.  Pertinent  Medical History  Vocal cord paralysis felt secondary to prior thyroid surgery Possible COPD Tobacco abuse Peripheral edema Hypothyroidism Anemia hyperlipidemia hyperglycemia hearing deficits  Significant Hospital Events: Including procedures, antibiotic start and stop dates in addition to other pertinent events   9/28 admitted with acute hypoxic and hypercarbic respiratory failure secondary to pneumonia intubated and ER.  Started on empiric antibiotics.  9/29 extubated. Post-extubation stridor worse than baseline upper airway noises. ENT consulted for worsening dysphonia and stridor. Bedside flex lary showed right vocal cord paralysis and left vocal cord mass. Went to OR mass BX'd trach placed.  9/30 SLP evaluating. Move out of ICU. Remove foley   Interim History /  Subjective:  Denies SOB Wants her pain meds.   Objective   Blood pressure (!) 154/101, pulse 67, temperature 98.1 F (36.7 C), temperature source Oral, resp. rate 17, height 5' 0.98" (1.549 m), weight 84.1 kg, SpO2 100%.    FiO2 (%):  [30 %-40 %] 30 %   Intake/Output Summary (Last 24 hours) at 11/29/2022 0942 Last data filed at 11/29/2022 0800 Gross per 24 hour  Intake 491.05 ml  Output 2665 ml  Net -2173.95 ml   Filed Weights   11/27/22 0100 11/28/22 0500 11/28/22 1145  Weight: 83.9 kg 84.1 kg 84.1 kg    Examination: General resting in bed no distress HENT #6 cuffed trach is unremarkable  Pulm clear. Decreased bases. PCXR w/ improving bilateral airspace disease Card rrr Abd soft  Ext warm and dry  Neuro intact anxious   Resolved Hospital Problem list     Assessment & Plan:  Acute hypoxic and hypercarbic respiratory failure secondary to pneumonia and acute COPD exacerbation complicated by upper airway obstruction from left glottic mass and vocal cord paralysis, now s/p Tracheostomy 9/29 -also w/ h/o VCD and vocal cord paralysis  Plan Cont ATC Cont routine trach care  SLP eval for swallow and PMV w/ planned trach change 10/4 by ENT  F/u pathology, will likely need onc eval  Will place trach team order for home education and set her up for f/u in trach clinic  Cont nebulized BDs Begin pred taper when can take PO Day 4 of 7 Levaquin  recommend quitting smoking; nicotine patch ordered recommend referral for lung cancer screening at discharge  Hyperglycemia plan SSI PRN goal BG 140-180  History of hypothyroidism Plan synthroid  Chronic pain  Plan Back to methadone when can take POs  Best Practice (right click and "Reselect all  SmartList Selections" daily)   Diet/type: tubefeeds DVT prophylaxis: LMWH GI prophylaxis: H2B Lines: N/A Foley:  removal ordered  Code Status:  full code Last date of multidisciplinary goals of care discussion [pending  ]    Critical care time:  NA    Simonne Martinet ACNP-BC [facility] Pulmonary/Critical Care Pager # [phone] OR # [phone] if no answer

## 2022-11-29 NOTE — Evaluation (Signed)
Physical Therapy Evaluation Patient Details Name: Meghan Contreras MRN: [id number] DOB: [birth year] Today's Date: 11/29/2022  History of Present Illness  Pt is a 57 y/o female admitted 9/28 with acute onset of SOB with progressive decline in spite of bronchodilator use to finally intubated.  Tracheostomy 9/29.  PMHx, anxiety, Bipolar d/o, COPD, depression, HOH, opiate addiction, PTSD, tobacco dependence.  Clinical Impression  Pt admitted with/for SOB in part due to PNA.  Pt was trached and is now able to mobilize at [facility] level.  Expect quick progress to [place].  Pt currently limited functionally due to the problems listed below.  (see problems list.)  Pt will benefit from PT to maximize function and safety to be able to get home safely with available assist .         If plan is discharge home, recommend the following: A little help with bathing/dressing/bathroom;Assistance with cooking/housework;Assist for transportation   Can travel by private vehicle        Equipment Recommendations None recommended by PT (TBD before d/c)  Recommendations for Other Services       Functional Status Assessment Patient has had a recent decline in their functional status and demonstrates the ability to make significant improvements in function in a reasonable and predictable amount of time.     Precautions / Restrictions Precautions Precautions:  (low fall risk)      Mobility  Bed Mobility Overal bed mobility: Needs Assistance Bed Mobility: Supine to Sit     Supine to sit: Contact guard     General bed mobility comments: HOB up, but no assist given    Transfers Overall transfer level: Needs assistance   Transfers: Sit to/from Stand Sit to Stand: Contact guard assist (from various heights)                Ambulation/Gait Ambulation/Gait assistance: Contact guard assist Gait Distance (Feet): 340 Feet Assistive device: IV Pole (NONE) Gait Pattern/deviations: Step-through  pattern   Gait velocity interpretation: 1.31 - 2.62 ft/sec, indicative of limited community ambulator   General Gait Details: shorter, tentative steps whether using IV pole or not.  SpO2 on FiO2 35% at 6L dropping to between 85 and 89%, on 8L sats maintained at 88 to 90 %.  HR in the upper 80's.  Stairs            Wheelchair Mobility     Tilt Bed    Modified Rankin (Stroke Patients Only)       Balance Overall balance assessment: Needs assistance Sitting-balance support: No upper extremity supported, Feet supported Sitting balance-Leahy Scale: Fair     Standing balance support: No upper extremity supported Standing balance-Leahy Scale: Fair                               Pertinent Vitals/Pain Pain Assessment Pain Assessment: 0-10 Pain Intervention(s): Monitored during session    Home Living Family/patient expects to be discharged to:: Private residence Living Arrangements: Spouse/significant other Available Help at Discharge: Family;Available 24 hours/day;Available PRN/intermittently Type of Home: House Home Access: Level entry       Home Layout: One level Home Equipment: Grab bars - tub/shower      Prior Function Prior Level of Function : Independent/Modified Independent             Mobility Comments: no AD ADLs Comments: Indep with ADLs, spouse provides supervision for tub transfers (takes bubble baths), can manage IADLs in the home.  Does not drive     Extremity/Trunk Assessment   Upper Extremity Assessment Upper Extremity Assessment: Defer to OT evaluation    Lower Extremity Assessment Lower Extremity Assessment: Overall WFL for tasks assessed    Cervical / Trunk Assessment Cervical / Trunk Assessment:  (mild cervical flexion due to new trach)  Communication   Communication Communication: Tracheostomy  Cognition Arousal: Alert Behavior During Therapy: WFL for tasks assessed/performed Overall Cognitive Status: Within  Functional Limits for tasks assessed                                          General Comments      Exercises     Assessment/Plan    PT Assessment Patient needs continued PT services  PT Problem List Decreased strength;Decreased activity tolerance;Decreased balance;Decreased mobility;Decreased knowledge of use of DME;Cardiopulmonary status limiting activity       PT Treatment Interventions Gait training;Stair training;Functional mobility training;Therapeutic activities;Balance training;Patient/family education    PT Goals (Current goals can be found in the Care Plan section)  Acute Rehab PT Goals Patient Stated Goal: home independent while husband works PT Goal Formulation: With patient Time For Goal Achievement: 12/14/22 Potential to Achieve Goals: Good    Frequency Min 1X/week     Co-evaluation               AM-PAC PT "6 Clicks" Mobility  Outcome Measure Help needed turning from your back to your side while in a flat bed without using bedrails?: None Help needed moving from lying on your back to sitting on the side of a flat bed without using bedrails?: A Little Help needed moving to and from a bed to a chair (including a wheelchair)?: A Little Help needed standing up from a chair using your arms (e.g., wheelchair or bedside chair)?: A Little Help needed to walk in hospital room?: A Little Help needed climbing 3-5 steps with a railing? : A Little 6 Click Score: 19    End of Session Equipment Utilized During Treatment: Oxygen Activity Tolerance: Patient tolerated treatment well Patient left: in chair;with call bell/phone within reach;with family/visitor present Nurse Communication: Mobility status PT Visit Diagnosis: Unsteadiness on feet (R26.81);Other abnormalities of gait and mobility (R26.89)    Time: 1137-1209 PT Time Calculation (min) (ACUTE ONLY): 32 min   Charges:   PT Evaluation $PT Eval Moderate Complexity: 1 Mod   PT General  Charges $$ ACUTE PT VISIT: 1 Visit         11/29/2022  Jacinto Halim., PT Acute Rehabilitation Services [phone]  (office)  Meghan Contreras 11/29/2022, 12:36 PM

## 2022-11-29 NOTE — Progress Notes (Signed)
[facility] ADULT ICU REPLACEMENT PROTOCOL   The patient does apply for the [facility] Adult ICU Electrolyte Replacment Protocol based on the criteria listed below:   1.Exclusion criteria: TCTS, ECMO, Dialysis, and Myasthenia Gravis patients 2. Is GFR >/= 30 ml/min? Yes.    Patient's GFR today is >60 3. Is SCr </= 2? Yes.   Patient's SCr is 0.92 mg/dL 4. Did SCr increase >/= 0.5 in 24 hours? No. 5.Pt's weight >40kg  Yes.   6. Abnormal electrolyte(s): K  7. Electrolytes replaced per protocol 8.  Call MD STAT for K+ </= 2.5, Phos </= 1, or Mag </= 1 Physician:  Halina Andreas E Krystol Rocco 11/29/2022 4:43 AM

## 2022-11-30 DIAGNOSIS — J9602 Acute respiratory failure with hypercapnia: Secondary | ICD-10-CM | POA: Diagnosis not present

## 2022-11-30 LAB — GLUCOSE, CAPILLARY
Glucose-Capillary: 73 mg/dL (ref 70–99)
Glucose-Capillary: 94 mg/dL (ref 70–99)

## 2022-11-30 MED ORDER — POLYETHYLENE GLYCOL 3350 17 G PO PACK
17.0000 g | PACK | Freq: Every day | ORAL | Status: DC | PRN
  Administered 2022-12-01 – 2022-12-05 (×4): 17 g via ORAL
  Filled 2022-11-30 (×3): qty 1

## 2022-11-30 MED ORDER — QUETIAPINE FUMARATE 100 MG PO TABS
100.0000 mg | ORAL_TABLET | Freq: Every day | ORAL | Status: DC
  Administered 2022-11-30 – 2022-12-02 (×3): 100 mg via ORAL
  Filled 2022-11-30 (×3): qty 1

## 2022-11-30 MED ORDER — METHADONE HCL 5 MG PO TABS
55.0000 mg | ORAL_TABLET | Freq: Every day | ORAL | Status: DC
  Administered 2022-11-30 – 2022-12-07 (×8): 55 mg via ORAL
  Filled 2022-11-30 (×8): qty 1

## 2022-11-30 MED ORDER — ACETAMINOPHEN 325 MG PO TABS
650.0000 mg | ORAL_TABLET | Freq: Four times a day (QID) | ORAL | Status: DC | PRN
  Administered 2022-11-30 – 2022-12-05 (×2): 650 mg via ORAL
  Filled 2022-11-30 (×2): qty 2

## 2022-11-30 MED ORDER — QUETIAPINE FUMARATE 50 MG PO TABS
150.0000 mg | ORAL_TABLET | Freq: Every day | ORAL | Status: DC
  Administered 2022-12-01 – 2022-12-02 (×2): 150 mg via ORAL
  Filled 2022-11-30 (×2): qty 1

## 2022-11-30 MED ORDER — METHADONE HCL 10 MG/ML PO CONC: 55.0000 mg | Freq: Every day | ORAL | Status: DC

## 2022-11-30 MED ORDER — QUETIAPINE FUMARATE 100 MG PO TABS
300.0000 mg | ORAL_TABLET | Freq: Every day | ORAL | Status: DC
  Filled 2022-11-30: qty 3

## 2022-11-30 MED ORDER — ENSURE ENLIVE PO LIQD
237.0000 mL | ORAL | Status: DC
  Administered 2022-11-30 – 2022-12-06 (×6): 237 mL via ORAL

## 2022-11-30 MED ORDER — QUETIAPINE FUMARATE 50 MG PO TABS: 150.0000 mg | ORAL_TABLET | Freq: Every day | ORAL | Status: DC

## 2022-11-30 MED ORDER — ADULT MULTIVITAMIN W/MINERALS CH
1.0000 | ORAL_TABLET | Freq: Every day | ORAL | Status: DC
  Administered 2022-11-30 – 2022-12-07 (×8): 1 via ORAL
  Filled 2022-11-30 (×8): qty 1

## 2022-11-30 NOTE — Progress Notes (Signed)
Patient's current orders include Seroquel 150 mg during the day and 300 mg at bedtime.  Patient is requesting dose of Seroquel at bedtime to be reduced to 100 mg which has been done.

## 2022-11-30 NOTE — Procedures (Signed)
Objective Swallowing Evaluation: Type of Study: FEES-Fiberoptic Endoscopic Evaluation of Swallow   Patient Details  Name: Meghan Contreras MRN: [id number] Date of Birth: 1965/10/03  Today's Date: 11/30/2022 Time: SLP Start Time (ACUTE ONLY): 1108 -SLP Stop Time (ACUTE ONLY): 1122  SLP Time Calculation (min) (ACUTE ONLY): 14 min   Past Medical History:  Past Medical History:  Diagnosis Date   Anemia    Anxiety    Bipolar disorder (HCC)    COPD (chronic obstructive pulmonary disease) (HCC)    Depression    Elevated brain natriuretic peptide (BNP) level    Elevated serum creatinine    HOH (hard of hearing)    Hyperglycemia    Hyperlipidemia 2021   Hyperthyroidism    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  Old records from [facility] with CT of soft tissue of chest and neck 11/2017:  No findings to suggest cause of stridorous breathing and no obvious lung disease.     Tobacco dependence    Past Surgical History:  Past Surgical History:  Procedure Laterality Date   DIRECT LARYNGOSCOPY N/A 11/28/2022   Procedure: DIRECT LARYNGOSCOPY;  Surgeon: Scarlette Ar, MD;  Location: [facility] OR;  Service: ENT;  Laterality: N/A;   ROBOTIC ASSISTED BILATERAL SALPINGO OOPHERECTOMY N/A 01/08/2022   Procedure: XI ROBOTIC ASSISTED BILATERAL SALPINGO OOPHORECTOMY;  Surgeon: Carver Fila, MD;  Location: [facility] ORS;  Service: Gynecology;  Laterality: N/A;   THYROIDECTOMY  2019   TRACHEOSTOMY TUBE PLACEMENT N/A 11/28/2022   Procedure: TRACHEOSTOMY;  Surgeon: Scarlette Ar, MD;  Location: [facility] OR;  Service: ENT;  Laterality: N/A;   HPI: Meghan Contreras is a 57 y.o. year-old female with a history of COPD presenting to the ED with chief complaint of respiratory  distress. Intubated 9/28-9/29 but noted consistent stridor and ENT consulted. 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 on 9/29. PMH vocal cord paralysis due to prior thyroid surgery, possible COPD   No data recorded   Recommendations for follow up therapy are one component of a multi-disciplinary discharge planning process, led by the attending physician.  Recommendations may be updated based on patient status, additional functional criteria and insurance authorization.  Assessment / Plan / Recommendation     11/30/2022   11:50 AM  Clinical Impressions  Clinical Impression Pt seen for FEES sitting in chair while wearing Passy-Muir speaking valve and alert. She exhibited minimal pharyngeal dysphagia without laryngeal penetration or aspiration. Standing secretions mild in glottis that she is able to cough and clear. Pt has history of right paralyzed vocal cord per ENT  and it was observed that her right arytenoid cartilidge was more stationary in a midline position and not moving as much as the left. Vocal cords were somewhat difficult to view- refer to ENT note for anatomy of vocal cords. Pt's swallow was initiated at the level of the valleculae with thin liquids. There was minimal residue in valleculae with puree and solid texture that pt sensed and immediately swallowed to clear. Pt was given multiple sips thin liquid without aspiration seen when pt asked to cough post swallow. Oral ly pt able  to control, transit consistencies and masticate solid texture without delay despite having no maxillary dentition. Recommend regular diet, thin liquids, pills with thin and wear PMV with all meals and meds. ST will continue to follow.  SLP Visit Diagnosis Dysphagia, pharyngeal phase (R13.13)  Impact on safety and function Mild aspiration risk         11/30/2022   11:50 AM  Treatment Recommendations   Treatment Recommendations Therapy as outlined in treatment plan below        11/30/2022   11:50 AM  Prognosis  Prognosis for improved oropharyngeal function Good       11/30/2022   11:50 AM  Diet Recommendations  SLP Diet Recommendations Regular solids;Thin liquid  Liquid Administration via Cup;Straw  Medication Administration Whole meds with liquid  Compensations Other (Comment)  Postural Changes Seated upright at 90 degrees         11/30/2022   11:50 AM  Other Recommendations  Oral Care Recommendations Oral care BID  Follow Up Recommendations --  Functional Status Assessment Patient has had a recent decline in their functional status and demonstrates the ability to make significant improvements in function in a reasonable and predictable amount of time.       11/30/2022   11:50 AM  Frequency and Duration   Speech Therapy Frequency (ACUTE ONLY) min 2x/week  Treatment Duration 2 weeks         11/30/2022   11:50 AM  Oral Phase  Oral Phase [facility]       11/30/2022   11:50 AM  Pharyngeal Phase  Pharyngeal Phase Impaired  Pharyngeal- Thin Teaspoon WFL  Pharyngeal Material does not enter airway  Pharyngeal- Thin Cup [facility]  Pharyngeal Material does not enter airway  Pharyngeal- Thin Straw WFL  Pharyngeal Material does not enter airway  Pharyngeal- Puree Pharyngeal residue - valleculae  Pharyngeal Material does not enter airway  Pharyngeal- Mechanical Soft NT  Pharyngeal- Regular Pharyngeal residue - valleculae  Pharyngeal Material does not enter airway  Pharyngeal- Multi-consistency NT  Pharyngeal- Pill NT        11/30/2022   11:50 AM  Cervical Esophageal Phase   Cervical Esophageal Phase WFL     [name] 11/30/2022, 12:56 PM

## 2022-11-30 NOTE — Discharge Instructions (Signed)
General, Healthful Nutrition Therapy  This handout provides you with the information you'll need to follow a general, healthful diet, which can be tailored to your personal preferences. There are several benefits to following a general, healthful diet: It could mean less calories, less salt, less added sugars, and less saturated fat than many other diets. This outcome will depend on the foods you choose. Eating more whole grains, beans, lentils, fruits, vegetables, nuts, and seeds may improve how much fiber, vitamins, and minerals you eat.  It can lower your risk for health conditions like diabetes, heart disease, hypertension, stroke, and cancer. Your registered dietitian nutritionist (RDN) may recommend portion sizes based on your individual needs and personal and cultural preferences.  Tips Every day, eat a variety of fruits and vegetables in a variety of colors. Be sure to include lots of dark green, red, blue-purple, and orange vegetables. Choose whole grains for at least half of your grain selections. Eat more beans, peas, and lentils. Try meatless alternatives. Get protein in your diet from eggs, fish, poultry, beans, peas, lentils, and nuts/nut butters. Low-fat or fat-free dairy products are also good sources of protein. Keep your salt intake to a minimum (less than 2300 milligrams per day). Limit use of salt, soy sauce, or fish sauce when cooking. Eat freshly prepared meals at home. Processed, prepackaged, and restaurant foods contain more salt. Choose fresh fruits and vegetables for snacks. Choose products with lower sodium content when grocery shopping. Limit your daily sugar intake. Sugar may be used in sauces, marinades, dressings, and condiments - even those that do not taste sweet.  Sugar can be found in honey, syrups, jelly, fruit juice, and fruit juice concentrate. Limit sugar-sweetened beverages like sodas and fruit juice, sugary snacks, and candy. It's best to choose  products without added sugar, but if you do eat them, read labels carefully so you know how much sugar is in each portion. It is better to eat unsaturated fats than saturated fats. Use fats and oils in moderation, up to 5 servings per day. Unsaturated fat is found in fish, avocado, nuts, and oils like sunflower, canola, avocado and olive oils. Saturated fat is found in fatty meat, butter, ice cream, palm and coconut oil, cream, cheese, and lard. Many processed foods, fried foods, fast food items, convenience foods like frozen pizza and snack foods, and sweets including pies, cookies, and other pastries are high in fat. Check nutrition labels and choose these foods less often. Use vegetable oil instead of lard or butter for cooking. Boil, steam, or bake your food instead of deep frying in oil. Remove the fatty part of meats before cooking.  Foods to Choose or to Limit Choose a healthful balance of foods from each food group at your meals. Your RDN may make individualized portion size recommendations based on your needs. Food Group Foods to Choose Foods to Limit  Grains Whole wheat, barley, rye, buckwheat, corn, teff, quinoa, millet, amaranth, brown and wild rice, sorghum, and oats; focus on intact cooked whole grains Grain products, such as bread, rolls, prepared breakfast cereals, crackers, and pasta made from whole grains that are low in added sugars, saturated fat, and sodium Sweetened, low-fiber breakfast cereals Packaged (high sugar, refined ingredients) baked goods Snack crackers and chips made of refined ingredients, cheese crackers, butter crackers Breads made with refined ingredients and saturated fats, such as biscuits, frozen waffles, sweet breads, doughnuts, pastries, packaged baking mixes, pancakes, cakes, and cookies  Protein Foods Meat, including lean, trimmed cuts of   beef, pork, or lamb a few times per week or less Poultry, including skinless chicken or turkey Seafood, including  fish, shrimp, lobster, clams, and scallops at least twice per week. Focus on fatty fish, such as salmon, herring, and sardines, as a rich source of omega-3 fatty acids Eggs Nuts and seeds, such as peanuts, almonds, pistachios, and sunflower seeds (unsalted varieties) Nut and seed butters, such as peanut butter, almond butter, and sunflower seed butter (reduced-sodium varieties)  Soy foods such as tofu, tempeh, or soy nuts Plant protein-based meat alternatives, such as veggie burgers and sausages (reduced-sodium varieties) Unsalted beans, lentils, or peas at least a few times per week in place of other protein sources Marbled or fatty red meats (beef, pork, lamb), such as ribs Processed red meats, such as bacon, sausage, and ham Poultry (chicken and turkey) with skin Fried meats, poultry, or fish King mackerel, shark, and tilefish (may contain high levels of mercury) Deli meats, such as pastrami, bologna, or salami Fried eggs Salted beans, peas, lentils, nuts, seeds, or nut/seed butters Meat alternatives with high levels of sodium or saturated fat  Dairy and Dairy Alternatives Low-fat or fat-free milk, yogurt (low in added sugars), cottage cheese, and cheeses Fortified soymilk or soy yogurt Whole milk, cream, cheeses made from whole milk, sour cream Yogurt or ice cream made from whole milk or with added sugar Cream cheese made from whole milk  Vegetables A variety of vegetables, including dark-green, red, blue-purple, and orange vegetables Low-sodium vegetable juices Canned or frozen vegetables with salt, fresh vegetables prepared with salt Fried vegetables Vegetables in cream sauce or cheese sauce Tomato or pasta sauce with high levels of salt or sugar  Fruit A variety of whole fruits, canned fruit packed in water, or dried fruit 100% fruit juice (limited to  cup per day) Fruits packed in syrup or made with added sugar  Fats and Oils Unsaturated vegetable oils, including olive, peanut, and  canola oils Vegetable oil-based margarines and spreads Salad dressing and mayonnaise made from unsaturated vegetable oils Solid shortening or partially hydrogenated oils Solid margarine made with hydrogenated or partially hydrogenated oils Butter  Beverages Coffee and tea (unsweetened) Water Sweetened drinks, including sweetened coffee or tea drinks, soda, energy drinks, and sports drinks  Other Prepared foods, including soups, casseroles, salads, baked goods, and snacks made from recommended ingredients, with low levels of added saturated fat, added sugars, or salt Sugary and/or fatty desserts, candy, and other sweets; salt and seasonings that contain salt Fried foods   General, Healthful Diet Sample 1-Day Menu View Nutrient Info Breakfast 1 cup oatmeal  cup blueberries 1 ounce almonds 1 cup 1% milk or fortified soymilk 1 cup unsweetened coffee  Lunch 2 slices whole wheat bread 3 ounces turkey slices 2 lettuce leaves 2 slices tomato 1 ounce reduced-fat, reduced sodium cheese  cup carrot sticks  cup hummus 1 banana 1 cup 1% milk or fortified soymilk 1 cup unsweetened tea  Evening Meal 4 ounces salmon, baked  cup cooked brown rice 1 cup green beans, cooked 1 cup mixed greens salad 1 teaspoon olive oil mixed with vinegar of choice 1 whole wheat dinner roll 1 teaspoon margarine, soft, tub (for roll) 1 cup water  Evening Snack 1 cup low-fat yogurt  cup sliced peaches    

## 2022-11-30 NOTE — Progress Notes (Signed)
Mobility Specialist Progress Note:    11/30/22 1540  Mobility  Activity Ambulated with assistance in hallway  Level of Assistance Standby assist, set-up cues, supervision of patient - no hands on  Assistive Device None  Distance Ambulated (ft) 800 ft  Activity Response Tolerated well  Mobility Referral Yes  $Mobility charge 1 Mobility  Mobility Specialist Start Time (ACUTE ONLY) 1440  Mobility Specialist Stop Time (ACUTE ONLY) 1452  Mobility Specialist Time Calculation (min) (ACUTE ONLY) 12 min   Received pt w/ RN having no complaints and agreeable to mobility. Pt ambulated on RA and SPO2 remained between 87%-91%.  Pt was asymptomatic throughout ambulation and returned to room w/o fault. Left in bed w/ call bell in reach and all needs met.   Thompson Grayer Mobility Specialist  Please contact vis Secure Chat or  Rehab Office [phone]

## 2022-11-30 NOTE — TOC Initial Note (Addendum)
Transition of Care [facility]) - Initial/Assessment Note    Patient Details  Name: Meghan Contreras MRN: [id number] Date of Birth: 1965-03-04  Transition of Care [facility]) CM/SW Contact:    Leone Haven, RN Phone Number: 11/30/2022, 3:28 PM  Clinical Narrative:                 From home with spouse, has PCP, Julieanne Manson and insurance on file, states has no HH services in place at this time or DME at home.   She has a new tracheostomy, will need HHRN and trach supplies, she states she has no preference for [facility] agency or DME agency.  NCM made referral to Amy with [facility], awaiting call back.  NCM made referral to [facility]  with [employer] for trach supplies.  States spouse will transport her home at [employer] and family is support system, states [facility] [address]   Pta self ambulatory .  Expected Discharge Plan: Home w Home Health Services Barriers to Discharge: Continued Medical Work up   Patient Goals and CMS Choice Patient states their goals for this hospitalization and ongoing recovery are:: return home CMS Medicare.gov Compare Post Acute Care list provided to:: Patient Choice offered to / list presented to : Patient      Expected Discharge Plan and Services In-house Referral: NA Discharge Planning Services: CM Consult Post Acute Care Choice: Home Health Living arrangements for the past 2 months: Single Family Home                 DME Arranged: Trach supplies DME Agency: [employer] Date DME Agency Contacted: 11/30/22 Time DME Agency Contacted: 1527 Representative spoke with at DME Agency: Marthann Schiller HH Arranged: RN HH Agency: [facility] Date [facility] Agency Contacted: 11/30/22 Time HH Agency Contacted: 1527 Representative spoke with at [facility] Agency: Amy  Prior Living Arrangements/Services Living arrangements for the past 2 months: Single Family Home Lives with:: Spouse Patient language and need for interpreter reviewed:: Yes Do you feel safe going  back to the place where you live?: Yes      Need for Family Participation in Patient Care: Yes (Comment) Care giver support system in place?: Yes (comment)   Criminal Activity/Legal Involvement Pertinent to Current Situation/Hospitalization: No - Comment as needed  Activities of Daily Living      Permission Sought/Granted Permission sought to share information with : Case Manager Permission granted to share information with : Yes, Verbal Permission Granted     Permission granted to share info w AGENCY: Home Health, DME        Emotional Assessment Appearance:: Appears stated age Attitude/Demeanor/Rapport: Engaged Affect (typically observed): Appropriate Orientation: : Oriented to Self, Oriented to Place, Oriented to  Time, Oriented to Situation Alcohol / Substance Use: Not Applicable Psych Involvement: No (comment)  Admission diagnosis:  Acute respiratory failure with hypercapnia (HCC) [J96.02] Patient Active Problem List   Diagnosis Date Noted   Pneumonia of right lower lobe due to infectious organism 11/29/2022   Acute respiratory failure with hypercapnia (HCC) 11/27/2022   Peripheral edema 07/16/2022   Elevated brain natriuretic peptide (BNP) level 07/16/2022   Elevated BP without diagnosis of hypertension 06/25/2022   Ovarian torsion 02/11/2022   Pelvic mass 01/11/2022   Constipation 01/11/2022   COPD (chronic obstructive pulmonary disease) (HCC) 01/07/2022   Chronic hypoxic respiratory failure (HCC) 01/07/2022   AKI (acute kidney injury) (HCC) 01/07/2022   Bipolar disorder (HCC) 01/07/2022   PTSD (post-traumatic stress disorder) 01/07/2022  Normocytic anemia 01/07/2022   Hyperglycemia 01/07/2022   Opioid dependence (HCC) 01/07/2022   Hypothyroidism 01/07/2022   Ovarian mass 01/07/2022   Adnexal mass 01/07/2022   Left lower quadrant abdominal pain 01/07/2022   Anxiety state 01/07/2022   Smoker 01/07/2022   Umbilical hernia without obstruction or gangrene  01/07/2022   Panic attacks 12/28/2021   Tobacco dependence 10/07/2021   Nightmares associated with chronic post-traumatic stress disorder 09/02/2021   COPD (chronic obstructive pulmonary disease) with chronic bronchitis (HCC) 09/02/2021   Elevated serum creatinine 01/10/2021   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) 01/10/2021   Decreased hearing 01/30/2020   Dental decay 01/30/2020   Opiate addiction (HCC)    Mixed hyperlipidemia    Hypothyroidism 02/11/2019   Tobacco abuse 02/11/2019   Depression 02/11/2019   PTSD (post-traumatic stress disorder) 02/11/2019   Stridor    Acute on chronic respiratory failure with hypoxemia (HCC) 08/04/2018   COPD with acute exacerbation (HCC) 08/04/2018   Hyperthyroidism 08/04/2018   Acute metabolic encephalopathy 08/04/2018   PCP:  Julieanne Manson, MD Pharmacy:   [facility], [place] - [address] [place] [address] Phone: [phone] Fax: [phone]  [facility] - [place], [place] - [address] [place] [address] Phone: [phone] Fax: [phone]  [facility] [address] [place] - [address] [place] [address] Phone: [phone] Fax: [phone]  [facility] - [place], [place] - [address] [place] [address] Phone: [phone] Fax: [phone]  [facility] [address] [place], [place] - [address] [place] [facility] [address] [place] [address] Phone: [phone] Fax: [phone]  [id number] [place], [place] - [address] [place] [address] Phone: [phone] Fax: [phone]     Social Determinants of Health (SDOH) Social History: SDOH Screenings   Food Insecurity: Patient Unable To Answer (11/27/2022)  Housing: Patient Unable To  Answer (02/11/2022)  Transportation Needs: No Transportation Needs (02/11/2022)  Utilities: Not At Risk (02/11/2022)  Depression (PHQ2-9): Low Risk  (12/30/2021)  Financial Resource Strain: Medium Risk (02/11/2022)  Tobacco Use: High Risk (11/28/2022)   SDOH Interventions:     Readmission Risk Interventions    11/29/2022    1:20 PM  Readmission Risk Prevention Plan  Transportation Screening Complete  HRI or Home Care Consult Complete  Social Work Consult for Recovery Care Planning/Counseling Complete  Palliative Care Screening Not Applicable  Medication Review Oceanographer) Referral to Pharmacy

## 2022-11-30 NOTE — Progress Notes (Signed)
PROGRESS NOTE    Meghan Contreras  NWG:[id number] DOB: 10/19/1965 DOA: 11/27/2022 PCP: Julieanne Manson, MD   Brief Narrative: Meghan Contreras is a 57 y.o. 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  Patient presented secondary to shortness of breath not responding to bronchodilators and progressing to need for manual mask ventilation and eventual intubation and ICU admission.  Patient was found to have evidence of right lower lobe pneumonia and COPD exacerbation.  Patient was also found to have evidence of upper airway obstruction secondary to a left glottic mass.  ENT was consulted and performed direct laryngoscopy and tracheostomy on 9/29.  Biopsy is pending.  Patient transferred out of the ICU on 10/1.   Assessment and Plan:  Acute respiratory failure with hypoxia and hypercapnia Secondary to pneumonia, COPD exacerbation complicated by upper airway obstruction.  Patient required intubation on 9/28 with eventual extubation on 9/29 after tracheostomy was placed.  Patient now on tracheostomy collar with 10 L/min supplemental oxygen.  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  Biopsy of of left glottic mass was obtained with pathology still pending. -SLP evaluation to advance diet -Follow-up surgical pathology; still pending as of 10/1  Right lower lobe pneumonia Patient was initially started empirically on Ceftriaxone/azithromycin with transition to Levaquin, secondary to B-lactam allergy, for treatment while in the ICU. -Continue Levaquin (plan for 7 day treatment course)  COPD exacerbation Patient managed on Solu-Medrol, Pulmicort, Brovana, Yupelri, addition to as needed albuterol. COPD exacerbation improved. -Continue Pulmicort, Roxy Manns; will need nebulizer medications on discharge -Pulmonology recommendation to de-escalate  steroids quickly.  Will transition to prednisone 40 mg for tomorrow  S/p tracheostomy Tracheostomy performed by ENT on 9/29. Plan to discharge with Shiley 4-0 cuffless tracheostomy tube per ENT; tube exchange planned on 10/4 -ENT recommendations: pending today -Pulmonology recommendations: plan for outpatient follow-up at trach clinic -SLP evaluation for Passy-Muir Valve assessment/FEES  Hyperglycemia Possibly related to steroids. She appears to meet criteria for pre-diabetes based on hemoglobin A1C of 5.9% from 05/2022. -Check hemoglobin A1C  Hypothyroidism -Continue Synthroid  Tobacco use Pulmonology recommendations for lung cancer screening referral at discharge.  Chronic pain Patient is managed on methadone as an outpatient which is held secondary to NPO status. -Continue dilaudid PRN -Resume methadone once able to take PO   DVT prophylaxis: Lovenox Code Status:   Code Status: Full Code Family Communication: None at bedside Disposition Plan: Home pending ongoing ENT recommendations/tracheostomy tube change, SLP recommendations for diet advancement   Consultants:  PCCM ENT  Procedures:  9/29: Direct laryngoscopy; tracheostomy  Antimicrobials: Levaquin Ceftriaxone Azithromycin   Subjective: Patient with no specific concerns except that she wants to be able to eat food and restart her oral outpatient medications.  Objective: BP (!) 141/94 (BP Location: Right Arm)   Pulse 72   Temp 98.6 F (37 C) (Oral)   Resp 20   Ht 5' 0.98" (1.549 m)   Wt 85.2 kg   SpO2 94%   BMI 35.51 kg/m   Examination:  General exam: Appears calm and comfortable.  Tracheostomy in place. Respiratory system: No wheezing.  Respiratory effort normal. Cardiovascular system: S1 & S2 heard, RRR. Gastrointestinal system: Abdomen is nondistended, soft and nontender. Normal bowel sounds heard. Central nervous system: Alert and oriented. No focal neurological deficits. Musculoskeletal: No  calf tenderness Psychiatry: Judgement and insight appear normal. Mood & affect appropriate.  Data Reviewed: I have personally reviewed following labs and imaging studies  CBC Lab Results  Component Value Date   WBC 10.5 11/29/2022   RBC 5.49 (H) 11/29/2022   HGB 13.5 11/29/2022   HCT 42.9 11/29/2022   MCV 78.1 (L) 11/29/2022   MCH 24.6 (L) 11/29/2022   PLT 247 11/29/2022   MCHC 31.5 11/29/2022   RDW 21.4 (H) 11/29/2022   LYMPHSABS 2.3 11/27/2022   MONOABS 0.2 11/27/2022   EOSABS 0.0 11/27/2022   BASOSABS 0.1 11/27/2022     Last metabolic panel Lab Results  Component Value Date   NA 139 11/29/2022   K 3.6 11/29/2022   CL 101 11/29/2022   CO2 27 11/29/2022   BUN 21 (H) 11/29/2022   CREATININE 0.92 11/29/2022   GLUCOSE 80 11/29/2022   GFRNONAA >60 11/29/2022   GFRAA >60 07/19/2019   CALCIUM 8.6 (L) 11/29/2022   PHOS 3.7 11/29/2022   PROT 7.5 11/27/2022   ALBUMIN 3.5 11/27/2022   LABGLOB 3.4 06/22/2022   AGRATIO 1.1 (L) 06/22/2022   BILITOT 0.8 11/27/2022   ALKPHOS 67 11/27/2022   AST 23 11/27/2022   ALT 18 11/27/2022   ANIONGAP 11 11/29/2022    GFR: Estimated Creatinine Clearance: 67.7 mL/min (by C-G formula based on SCr of 0.92 mg/dL).  Recent Results (from the past 240 hour(s))  Blood Culture (routine x 2)     Status: None (Preliminary result)   Collection Time: 11/27/22 12:46 AM   Specimen: BLOOD RIGHT HAND  Result Value Ref Range Status   Specimen Description BLOOD RIGHT HAND  Final   Special Requests   Final    BOTTLES DRAWN AEROBIC AND ANAEROBIC Blood Culture adequate volume   Culture   Final    NO GROWTH 3 DAYS Performed at [facility], [address] [place], [place] [address]    Report Status PENDING  Incomplete  Resp panel by RT-PCR (RSV, Flu A&B, Covid) Anterior Nasal Swab     Status: None   Collection Time: 11/27/22  1:00 AM   Specimen: Anterior Nasal Swab  Result Value Ref Range Status   SARS Coronavirus 2 by RT PCR NEGATIVE  NEGATIVE Final   Influenza A by PCR NEGATIVE NEGATIVE Final   Influenza B by PCR NEGATIVE NEGATIVE Final    Comment: (NOTE) The Xpert Xpress SARS-CoV-2/FLU/RSV plus assay is intended as an aid in the diagnosis of influenza from Nasopharyngeal swab specimens and should not be used as a sole basis for treatment. Nasal washings and aspirates are unacceptable for Xpert Xpress SARS-CoV-2/FLU/RSV testing.  Fact Sheet for Patients: BloggerCourse.com  Fact Sheet for Healthcare Providers: SeriousBroker.it  This test is not yet approved or cleared by the [place] FDA and has been authorized for detection and/or diagnosis of SARS-CoV-2 by FDA under an Emergency Use Authorization (EUA). This EUA will remain in effect (meaning this test can be used) for the duration of the COVID-19 declaration under Section 564(b)(1) of the Act, 21 U.S.C. section 360bbb-3(b)(1), unless the authorization is terminated or revoked.     Resp Syncytial Virus by PCR NEGATIVE NEGATIVE Final    Comment: (NOTE) Fact Sheet for Patients: BloggerCourse.com  Fact Sheet for Healthcare Providers: SeriousBroker.it  This test is not yet approved or cleared by the [place] FDA and has been authorized for detection and/or diagnosis of SARS-CoV-2 by FDA under an Emergency Use Authorization (EUA). This EUA will remain in effect (meaning this test can be used) for the duration of the COVID-19  declaration under Section 564(b)(1) of the Act, 21 U.S.C. section 360bbb-3(b)(1), unless the authorization is terminated or revoked.  Performed at [facility], [address] [place], [place] [address]   Blood Culture (routine x 2)     Status: None (Preliminary result)   Collection Time: 11/27/22  1:04 AM   Specimen: BLOOD LEFT HAND  Result Value Ref Range Status   Specimen Description BLOOD LEFT HAND  Final    Special Requests   Final    BOTTLES DRAWN AEROBIC AND ANAEROBIC Blood Culture adequate volume   Culture   Final    NO GROWTH 3 DAYS Performed at [facility], [address], [place], [place] [address]    Report Status PENDING  Incomplete  MRSA Next Gen by PCR, Nasal     Status: None   Collection Time: 11/27/22  3:04 AM   Specimen: Nasal Mucosa; Nasal Swab  Result Value Ref Range Status   MRSA by PCR Next Gen NOT DETECTED NOT DETECTED Final    Comment: (NOTE) The GeneXpert MRSA Assay (FDA approved for NASAL specimens only), is one component of a comprehensive MRSA colonization surveillance program. It is not intended to diagnose MRSA infection nor to guide or monitor treatment for MRSA infections. Test performance is not FDA approved in patients less than 44 years old. Performed at [facility], [address], [place], [place] [address]       Radiology Studies: CT SOFT TISSUE NECK W CONTRAST  Result Date: 11/28/2022 CLINICAL DATA:  Stridor. Smoker. Epiglottitis or tonsillitis suspected. Previous thyroidectomy. EXAM: CT NECK WITH CONTRAST TECHNIQUE: Multidetector CT imaging of the neck was performed using the standard protocol following the bolus administration of intravenous contrast. RADIATION DOSE REDUCTION: This exam was performed according to the departmental dose-optimization program which includes automated exposure control, adjustment of the mA and/or kV according to patient size and/or use of iterative reconstruction technique. CONTRAST:  75mL OMNIPAQUE IOHEXOL 350 MG/ML SOLN COMPARISON:  08/04/2018 FINDINGS: Pharynx and larynx: There is a tracheostomy which appears well position without apparent complicating feature. The trachea is widely patent beyond the tracheostomy. There appears to be soft tissue swelling of the posterior oropharynx and hypopharynx extending all the way to the level of the glottis. This appears circumferential. The epiglottis is slightly  thickened. No evidence of peritonsillar abscess or asymmetric tonsillar enlargement. The nature of this inflammation is uncertain. It could be angio edema, nonspecific pharyngitis or could possibly relate 2 radiation if that has been performed. The airway should be protected by the tracheostomy and I would not expect stridor if the patient is primarily moving air through the tracheostomy. Salivary glands: Parotid and submandibular glands are normal. Thyroid: Previous thyroidectomy. Small amount of air within the soft tissues of the lower neck and upper mediastinum, presumably subsequent to a recent procedure. Lymph nodes: No lymphadenopathy on either side of the neck. Vascular: No abnormal vascular finding. Limited intracranial: Normal Visualized orbits: Normal Mastoids and visualized paranasal sinuses: Clear Skeleton: Ordinary cervical spondylosis. Upper chest: Small bilateral pleural effusions layering dependently with dependent atelectasis. Mild atelectasis of the medial left upper lobe adjacent to the tortuous aorta. Other: None IMPRESSION: 1. Tracheostomy appears well positioned without apparent complicating feature. The trachea is widely patent beyond the tracheostomy to the carina. 2. Circumferential soft tissue swelling of the posterior oropharynx and hypopharynx extending all the way to the level of the glottis. This appears circumferential. The epiglottis is slightly thickened. No evidence of peritonsillar  abscess or asymmetric tonsillar enlargement. The airway should be protected by the tracheostomy and I would not expect stridor if the patient is primarily moving through the tracheostomy. The etiology of this pharyngitis pattern is unclear and could be infectious or angio edema. I do not see a history of radiation therapy in the medical record. 3. Small amount of air within the soft tissues of the lower neck and upper mediastinum, presumably subsequent to a recent procedure. 4. Small bilateral pleural  effusions layering dependently with dependent atelectasis. Mild atelectasis of the medial left upper lobe adjacent to the tortuous aorta. Electronically Signed   By: Paulina Fusi M.D.   On: 11/28/2022 18:59   DG CHEST PORT 1 VIEW  Result Date: 11/28/2022 CLINICAL DATA:  Tracheostomy dependent. COPD. Acute respiratory failure. EXAM: PORTABLE CHEST 1 VIEW COMPARISON:  Chest radiograph dated 11/28/2022. FINDINGS: Interval removal of the endotracheal tube and placement of a tracheostomy with tip above the carina. There are bibasilar atelectasis or infiltrate. Small pleural effusions may be present. No pneumothorax. Stable cardiomegaly. No acute osseous pathology. IMPRESSION: 1. Interval placement of a tracheostomy. 2. Bibasilar atelectasis or infiltrate. Electronically Signed   By: Elgie Collard M.D.   On: 11/28/2022 17:48      LOS: 3 days    Jacquelin Hawking, MD [facility] 11/30/2022, 11:40 AM   If 7PM-7AM, please contact night-coverage [email or web address]

## 2022-11-30 NOTE — Hospital Course (Signed)
Meghan Contreras is a 57 y.o. 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  Patient presented secondary to shortness of breath not responding to bronchodilators and progressing to need for manual mask ventilation and eventual intubation and ICU admission.  Patient was found to have evidence of right lower lobe pneumonia and COPD exacerbation.  Patient was also found to have evidence of upper airway obstruction secondary to a left glottic mass.  ENT was consulted and performed direct laryngoscopy and tracheostomy on 9/29.  Biopsy is pending.  Patient transferred out of the ICU on 10/1.

## 2022-11-30 NOTE — Progress Notes (Signed)
Nutrition Follow-up  DOCUMENTATION CODES:   Obesity unspecified  INTERVENTION:  Encourage adequate PO intake with emphasis on balanced nutrition  Recommend smaller, more frequent po intake via meals TID and protein containing snacks BID Ensure Enlive po once daily, each supplement provides 350 kcal and 20 grams of protein. MVI with minerals daily Recommend bowel regimen; last BM x4 days "General Healthful Nutrition Therapy" handout added to AVS  NUTRITION DIAGNOSIS:   Inadequate oral intake related to inability to eat as evidenced by NPO status. 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 Admitted with respiratory distress.  9/28: intubated 9/29: s/p laryngoscopy (biopsy pending) and trach placement; extubated 10/1: FEES- diet advanced to regular, thin liquids  Pt sitting up in bedside chair. Spoke with pt and her significant other at bedside. Provided therapeutic listening as significant other expressed concerns over pt's nutritional status.   Pt reports that she is not a "big eater" at baseline. She recalls eating 1 meal per day including oodles-of-noodles, hamburgers and peanut butter and jelly sandwiches.  She does not often snack but if she does it may be a butterfinger candy bar. She does not enjoy drinking water and usually drinks koolaid and sodas.   Given pt's baseline limited po intake and inadequate nutritional intake x4 days, suspect po may be limited initially. Discussed smaller, more frequent po intake to optimize nutritional status.   Pt states  that despite her limited daily po intake, her weight has remained stable around 184 lbs. Per review of weight history, over the last year it appears pt's weight has remained stable between 181-185 lbs. No significant changes noted.  Edema: non-pitting RUE, mild pitting BLE  Medications: solu-medrol  Labs: reviewed  NUTRITION - FOCUSED PHYSICAL EXAM:  Flowsheet Row Most Recent Value  Orbital Region No depletion  Upper Arm Region No depletion  Thoracic and Lumbar Region No depletion  Buccal Region No depletion  Temple Region No depletion  Clavicle Bone Region No depletion  Clavicle and Acromion Bone Region No depletion  Scapular Bone Region No depletion  Dorsal Hand No depletion  Patellar Region No depletion  Anterior Thigh Region No depletion  Posterior Calf Region No depletion  Edema (RD Assessment) Mild  [BLE]  Hair Reviewed  Eyes Reviewed  Mouth Reviewed  Skin Reviewed  Nails Reviewed      Diet Order:   Diet Order             Diet regular Room service appropriate? Yes with Assist; Fluid consistency: Thin  Diet effective now                  EDUCATION NEEDS:   Education needs have been addressed  Skin:  Skin Assessment:  (closed neck incision)  Last BM:  9/27  Height:   Ht Readings from Last 1 Encounters:  11/28/22 5' 0.98" (1.549 m)    Weight:   Wt Readings from Last 1 Encounters:  11/30/22 85.2 kg    Ideal Body Weight:  47.7 kg  BMI:  Body mass index is 35.51 kg/m.  Estimated Nutritional Needs:  Kcal:  1600-1800  Protein:  95-105g  Fluid:  1.8L/day  Drusilla Kanner, RDN, LDN Clinical Nutrition

## 2022-12-01 DIAGNOSIS — J9602 Acute respiratory failure with hypercapnia: Secondary | ICD-10-CM | POA: Diagnosis not present

## 2022-12-01 LAB — BASIC METABOLIC PANEL
Anion gap: 8 (ref 5–15)
BUN: 21 mg/dL — ABNORMAL HIGH (ref 6–20)
CO2: 28 mmol/L (ref 22–32)
Calcium: 8.7 mg/dL — ABNORMAL LOW (ref 8.9–10.3)
Chloride: 104 mmol/L (ref 98–111)
Creatinine, Ser: 1.01 mg/dL — ABNORMAL HIGH (ref 0.44–1.00)
GFR, Estimated: >60 mL/min (ref >60)
Glucose, Bld: 110 mg/dL — ABNORMAL HIGH (ref 70–99)
Potassium: 3.3 mmol/L — ABNORMAL LOW (ref 3.5–5.1)
Sodium: 140 mmol/L (ref 135–145)

## 2022-12-01 LAB — CBC
HCT: 40.9 % (ref 36.0–46.0)
Hemoglobin: 13 g/dL (ref 12.0–15.0)
MCH: 24.4 pg — ABNORMAL LOW (ref 26.0–34.0)
MCHC: 31.8 g/dL (ref 30.0–36.0)
MCV: 76.9 fL — ABNORMAL LOW (ref 80.0–100.0)
Platelets: 217 10*3/uL (ref 150–400)
RBC: 5.32 MIL/uL — ABNORMAL HIGH (ref 3.87–5.11)
RDW: 20.5 % — ABNORMAL HIGH (ref 11.5–15.5)
WBC: 8.3 10*3/uL (ref 4.0–10.5)
nRBC: 0 % (ref 0.0–0.2)

## 2022-12-01 LAB — HEMOGLOBIN A1C
Hgb A1c MFr Bld: 5.8 % — ABNORMAL HIGH (ref 4.8–5.6)
Mean Plasma Glucose: 119.76 mg/dL

## 2022-12-01 LAB — SURGICAL PATHOLOGY

## 2022-12-01 MED ORDER — PREDNISONE 20 MG PO TABS
40.0000 mg | ORAL_TABLET | Freq: Every day | ORAL | Status: DC
  Administered 2022-12-02 – 2022-12-07 (×6): 40 mg via ORAL
  Filled 2022-12-01 (×6): qty 2

## 2022-12-01 MED ORDER — POTASSIUM CHLORIDE CRYS ER 20 MEQ PO TBCR
40.0000 meq | EXTENDED_RELEASE_TABLET | Freq: Once | ORAL | Status: AC
  Administered 2022-12-01: 40 meq via ORAL
  Filled 2022-12-01: qty 2

## 2022-12-01 MED ORDER — LEVOTHYROXINE SODIUM 25 MCG PO TABS
137.0000 ug | ORAL_TABLET | Freq: Every day | ORAL | Status: DC
  Administered 2022-12-02: 137 ug via ORAL
  Filled 2022-12-01: qty 1

## 2022-12-01 NOTE — Progress Notes (Addendum)
ENT UPDATE NOTE  Please have Shiley 4-0 Cuffless tracheostomy tube available at bedside for first tracheostomy tube change on Friday 12/03/22.  In addition please provide: - Suture removal kit - Working bedside suction with yankauer and 12 or 14 french soft suction catheters - Lubrication - New foam tracheostomy collar    Electronically signed by:  Scarlette Ar, MD  Staff Physician Facial Plastic & Reconstructive Surgery Otolaryngology - Head and Neck Surgery [facility] [place]   Addendum:  Surgical pathology benign. Will discuss with patient.   FINAL MICROSCOPIC DIAGNOSIS:   A. VOCAL CORD MASS, LEFT, BIOPSY:       Benign squamous mucosa with acute and chronic inflammation,  necrosis, fibrinous exudate, and reactive epithelial change.       No evidence of dysplasia or malignancy   Electronically signed by:  Scarlette Ar, MD  Staff Physician Facial Plastic & Reconstructive Surgery Otolaryngology - Head and Neck Surgery [facility] [place]

## 2022-12-01 NOTE — Progress Notes (Signed)
Speech Language Pathology Treatment: Dysphagia;Passy Muir Speaking valve  Patient Details Name: Meghan Contreras MRN: [id number] DOB: 1965-07-16 Today's Date: 12/01/2022 Time: 4098-1191 SLP Time Calculation (min) (ACUTE ONLY): 11 min  Assessment / Plan / Recommendation Clinical Impression  Pt had completed breakfast when SLP entered and reported no difficulties. PMV donned when therapist arrived. Observed with thin liquids sequential sips via straw without s/s aspiration. Thorough and complete mastication with saltine cracker. Recommend continue regular texture, thin liquids, pills with thin and wear PMV during meals and meds.  Pt instructed re: donning and doffing PMV using mirror and verbal cues. Pt was able to demonstrate loosening trach collar to reach valve and tightening. She also was able to donn and doff valve x 4 during session using one hand to hold trach in place and right hand to remove and place valve. Her vocal intensity is adequate and speech is 100% intelligible. Respiratory rate appropriate for speech in conversation and no signs of back pressure with valve. Pt can wear valve during all waking hours and educated her to remove when sleeping. Could her trach be changed to uncuffed?  ST will follow.   HPI HPI: Meghan Contreras is a 57 y.o. year-old female with a history of COPD presenting to the ED with chief complaint of respiratory distress. Intubated 9/28-9/29 but noted consistent stridor and ENT consulted. 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 on 9/29. 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  Recommendations may be updated based on patient status,  additional functional criteria and insurance authorization.    Recommendations  Diet recommendations: Regular;Thin liquid Liquids provided via: Straw;Cup Medication Administration: Whole meds with liquid Supervision: Patient able to self feed Compensations: Slow rate;Small sips/bites (weare valve with meals/meds) Postural Changes and/or Swallow Maneuvers: Seated upright 90 degrees      Patient may use Passy-Muir Speech Valve: During all therapies with supervision PMSV Supervision: Intermittent MD: Please consider changing trach tube to : Cuffless           Oral care BID   Intermittent Supervision/Assistance Dysphagia, pharyngeal phase (R13.13);Aphonia (R49.1)     Continue with current plan of care     [name]  12/01/2022, 8:58 AM

## 2022-12-01 NOTE — Progress Notes (Signed)
PROGRESS NOTE    Meghan Contreras  AVW:[id number] DOB: [birth year] DOA: 11/27/2022 PCP: Julieanne Manson, MD   Brief Narrative:  57 y.o. 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  She was found to have right lower lobe pneumonia and COPD exacerbation and started on antibiotics and steroids.  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  ENT was consulted who performed direct laryngoscopy and tracheostomy on 11/28/2022.  Biopsies pending.  She was transferred to [facility] service from 11/30/2022 onwards.  Assessment & Plan:   Acute respiratory failure with hypoxia and hypercapnia Secondary to pneumonia, COPD exacerbation complicated by upper airway obstruction.  Patient required intubation on 9/28 with eventual extubation on 9/29 after tracheostomy was placed.  Patient now on tracheostomy collar with 10 L/min supplemental oxygen.  Wean off as able.   Upper airway obstruction status post tracheostomy Left glottic mass Vocal cord paralysis Patient underwent direct laryngoscopy and tracheostomy by ENT on 9/29.  Biopsy of left glottic mass was obtained with pathology still pending. -Trach care as per ENT.  Tube exchange planned on 12/03/2022 by ENT.  Will need outpatient follow-up with trach clinic -Diet as per SLP.  Right lower lobe pneumonia Patient was initially started empirically on Ceftriaxone/azithromycin with transition to Levaquin, secondary to B-lactam allergy, for treatment while in the ICU. -Continue Levaquin (plan for 7 day treatment course)   COPD exacerbation Patient managed on Solu-Medrol, Pulmicort, Brovana, Yupelri, addition to as needed albuterol. COPD exacerbation improved. -Continue Pulmicort, Roxy Manns -Pulmonology  recommendation to de-escalate steroids quickly.  Will transition to prednisone 40 mg from today.  Outpatient follow-up with pulmonary.  Hyperglycemia -Possibly related to steroids.  -A1c 5.8.  Outpatient follow-up.  Hypothyroidism -Continue Synthroid   Tobacco use -Pulmonology recommendations for lung cancer screening referral at discharge.   Chronic pain -Patient is managed on methadone as an outpatient  -Methadone has been resumed.  Obesity -Outpatient follow-up  Physical deconditioning -PT eval  Hypokalemia -Replace.  Repeat a.m. labs   DVT prophylaxis: Lovenox Code Status: Full Family Communication: None at bedside Disposition Plan: Status is: Inpatient Remains inpatient appropriate because: Of severity of illness    Consultants: PCCM/ENT  Procedures: As above  Antimicrobials:  Anti-infectives (From admission, onward)    Start     Dose/Rate Route Frequency Ordered Stop   11/28/22 0300  levofloxacin (LEVAQUIN) IVPB 750 mg        750 mg 100 mL/hr over 90 Minutes Intravenous Every 24 hours 11/27/22 1107     11/27/22 0300  levofloxacin (LEVAQUIN) IVPB 500 mg  Status:  Discontinued        500 mg 100 mL/hr over 60 Minutes Intravenous Every 24 hours 11/27/22 0227 11/27/22 1107   11/27/22 0100  cefTRIAXone (ROCEPHIN) 2 g in sodium chloride 0.9 % 100 mL IVPB        2 g 200 mL/hr over 30 Minutes Intravenous  Once 11/27/22 0045 11/27/22 0141   11/27/22 0100  azithromycin (ZITHROMAX) 500 mg in sodium chloride 0.9 % 250 mL IVPB        500 mg 250 mL/hr over 60 Minutes Intravenous  Once 11/27/22 0045 11/27/22 0250        Subjective: Patient seen and examined at bedside.  Feels slightly better.  Still short of breath with exertion.  No fever, vomiting, agitation reported.  Objective: Vitals:   12/01/22 0852 12/01/22 0900 12/01/22 1100 12/01/22 1115  BP:    114/86  Pulse: 71 66 77 69  Resp:    20  Temp:    98.6 F (37 C)  TempSrc:    Oral  SpO2: 96% 93% (!)  89% 94%  Weight:      Height:        Intake/Output Summary (Last 24 hours) at 12/01/2022 1127 Last data filed at 12/01/2022 0852 Gross per 24 hour  Intake 912.51 ml  Output --  Net 912.51 ml   Filed Weights   11/28/22 0500 11/28/22 1145 11/30/22 0507  Weight: 84.1 kg 84.1 kg 85.2 kg    Examination:  General exam: Appears calm and comfortable.  Looks chronically ill and deconditioned. ENT: On 10 L oxygen via tracheostomy Respiratory system: Bilateral decreased breath sounds at bases with scattered crackles Cardiovascular system: S1 & S2 heard, Rate controlled Gastrointestinal system: Abdomen is obese, nondistended, soft and nontender. Normal bowel sounds heard. Extremities: No cyanosis, clubbing; trace lower extremity edema present  Central nervous system: Alert and oriented.  Slow to respond.  No focal neurological deficits. Moving extremities Skin: No rashes, lesions or ulcers Psychiatry: Flat affect.  Not agitated.   Data Reviewed: I have personally reviewed following labs and imaging studies  CBC: Recent Labs  Lab 11/27/22 0046 11/27/22 0057 11/27/22 0258 11/27/22 0328 11/27/22 1143 11/29/22 0342 12/01/22 0252  WBC 7.2  --  8.5  --   --  10.5 8.3  NEUTROABS 4.6  --   --   --   --   --   --   HGB 12.6   < > 13.5 14.3 13.9 13.5 13.0  HCT 43.1   < > 44.4 42.0 41.0 42.9 40.9  MCV 83.7  --  83.3  --   --  78.1* 76.9*  PLT 244  --  237  --   --  247 217   < > = values in this interval not displayed.   Basic Metabolic Panel: Recent Labs  Lab 11/27/22 0046 11/27/22 0057 11/27/22 0258 11/27/22 0328 11/27/22 1143 11/29/22 0342 12/01/22 0252  NA 139   < > 136 140 139 139 140  K 4.4   < > 4.1 4.1 4.0 3.6 3.3*  CL 100  --  100  --   --  101 104  CO2 32  --  28  --   --  27 28  GLUCOSE 142*  --  146*  --   --  80 110*  BUN 22*  --  20  --   --  21* 21*  CREATININE 1.00  --  0.93  --   --  0.92 1.01*  CALCIUM 8.6*  --  7.9*  --   --  8.6* 8.7*  MG  --   --   --    --   --  2.1  --   PHOS  --   --   --   --   --  3.7  --    < > = values in this interval not displayed.   GFR: Estimated Creatinine Clearance: 61.7 mL/min (A) (by C-G formula based on SCr of 1.01 mg/dL (H)). Liver Function Tests: Recent Labs  Lab 11/27/22 0046  AST 23  ALT 18  ALKPHOS 67  BILITOT 0.8  PROT 7.5  ALBUMIN 3.5   No results for input(s): "LIPASE", "AMYLASE" in the last  168 hours. No results for input(s): "AMMONIA" in the last 168 hours. Coagulation Profile: Recent Labs  Lab 11/27/22 0046  INR 1.1   Cardiac Enzymes: No results for input(s): "CKTOTAL", "CKMB", "CKMBINDEX", "TROPONINI" in the last 168 hours. BNP (last 3 results) No results for input(s): "PROBNP" in the last 8760 hours. HbA1C: Recent Labs    12/01/22 0252  HGBA1C 5.8*   CBG: Recent Labs  Lab 11/29/22 0000 11/29/22 0342 11/29/22 0852 11/29/22 1235 11/29/22 1630  GLUCAP 94 76 73 94 108*   Lipid Profile: No results for input(s): "CHOL", "HDL", "LDLCALC", "TRIG", "CHOLHDL", "LDLDIRECT" in the last 72 hours. Thyroid Function Tests: No results for input(s): "TSH", "T4TOTAL", "FREET4", "T3FREE", "THYROIDAB" in the last 72 hours. Anemia Panel: No results for input(s): "VITAMINB12", "FOLATE", "FERRITIN", "TIBC", "IRON", "RETICCTPCT" in the last 72 hours. Sepsis Labs: Recent Labs  Lab 11/27/22 0056  LATICACIDVEN 0.6    Recent Results (from the past 240 hour(s))  Blood Culture (routine x 2)     Status: None (Preliminary result)   Collection Time: 11/27/22 12:46 AM   Specimen: BLOOD RIGHT HAND  Result Value Ref Range Status   Specimen Description BLOOD RIGHT HAND  Final   Special Requests   Final    BOTTLES DRAWN AEROBIC AND ANAEROBIC Blood Culture adequate volume   Culture   Final    NO GROWTH 4 DAYS Performed at [facility], [address] [place], [place] [address]    Report Status PENDING  Incomplete  Resp panel by RT-PCR (RSV, Flu A&B, Covid) Anterior Nasal Swab      Status: None   Collection Time: 11/27/22  1:00 AM   Specimen: Anterior Nasal Swab  Result Value Ref Range Status   SARS Coronavirus 2 by RT PCR NEGATIVE NEGATIVE Final   Influenza A by PCR NEGATIVE NEGATIVE Final   Influenza B by PCR NEGATIVE NEGATIVE Final    Comment: (NOTE) The Xpert Xpress SARS-CoV-2/FLU/RSV plus assay is intended as an aid in the diagnosis of influenza from Nasopharyngeal swab specimens and should not be used as a sole basis for treatment. Nasal washings and aspirates are unacceptable for Xpert Xpress SARS-CoV-2/FLU/RSV testing.  Fact Sheet for Patients: BloggerCourse.com  Fact Sheet for Healthcare Providers: SeriousBroker.it  This test is not yet approved or cleared by the [place] FDA and has been authorized for detection and/or diagnosis of SARS-CoV-2 by FDA under an Emergency Use Authorization (EUA). This EUA will remain in effect (meaning this test can be used) for the duration of the COVID-19 declaration under Section 564(b)(1) of the Act, 21 U.S.C. section 360bbb-3(b)(1), unless the authorization is terminated or revoked.     Resp Syncytial Virus by PCR NEGATIVE NEGATIVE Final    Comment: (NOTE) Fact Sheet for Patients: BloggerCourse.com  Fact Sheet for Healthcare Providers: SeriousBroker.it  This test is not yet approved or cleared by the [place] FDA and has been authorized for detection and/or diagnosis of SARS-CoV-2 by FDA under an Emergency Use Authorization (EUA). This EUA will remain in effect (meaning this test can be used) for the duration of the COVID-19 declaration under Section 564(b)(1) of the Act, 21 U.S.C. section 360bbb-3(b)(1), unless the authorization is terminated or revoked.  Performed at [facility], [address] [place], [place] [address]   Blood Culture (routine x 2)     Status: None (Preliminary  result)   Collection Time: 11/27/22  1:04 AM   Specimen: BLOOD LEFT HAND  Result Value Ref Range  Status   Specimen Description BLOOD LEFT HAND  Final   Special Requests   Final    BOTTLES DRAWN AEROBIC AND ANAEROBIC Blood Culture adequate volume   Culture   Final    NO GROWTH 4 DAYS Performed at [facility], [address] [place], [place] [address]    Report Status PENDING  Incomplete  MRSA Next Gen by PCR, Nasal     Status: None   Collection Time: 11/27/22  3:04 AM   Specimen: Nasal Mucosa; Nasal Swab  Result Value Ref Range Status   MRSA by PCR Next Gen NOT DETECTED NOT DETECTED Final    Comment: (NOTE) The GeneXpert MRSA Assay (FDA approved for NASAL specimens only), is one component of a comprehensive MRSA colonization surveillance program. It is not intended to diagnose MRSA infection nor to guide or monitor treatment for MRSA infections. Test performance is not FDA approved in patients less than 67 years old. Performed at [facility], [address], [place], [place] [address]          Radiology Studies: No results found.      Scheduled Meds:  arformoterol  15 mcg Nebulization BID   budesonide (PULMICORT) nebulizer solution  0.5 mg Nebulization BID   Chlorhexidine Gluconate Cloth  6 each Topical Daily   enoxaparin (LOVENOX) injection  40 mg Subcutaneous Q24H   feeding supplement  237 mL Oral Q24H   furosemide  20 mg Intravenous Once   [START ON 12/02/2022] levothyroxine  137 mcg Oral QAC breakfast   methadone  55 mg Oral Daily   methylPREDNISolone (SOLU-MEDROL) injection  40 mg Intravenous Daily   multivitamin with minerals  1 tablet Oral Daily   nicotine  21 mg Transdermal Daily   mouth rinse  15 mL Mouth Rinse 4 times per day   QUEtiapine  150 mg Oral Daily   And   QUEtiapine  100 mg Oral QHS   revefenacin  175 mcg Nebulization Daily   Continuous Infusions:  sodium chloride Stopped (11/30/22 0913)   levofloxacin (LEVAQUIN) IV Stopped  (12/01/22 0703)          Glade Lloyd, MD [facility] 12/01/2022, 11:27 AM

## 2022-12-01 NOTE — Plan of Care (Signed)
  Problem: Education: Goal: Knowledge of the prescribed therapeutic regimen will improve Outcome: Progressing Goal: Understanding of sexual limitations or changes related to disease process or condition will improve Outcome: Progressing Goal: Individualized Educational Video(s) Outcome: Progressing   Problem: Self-Concept: Goal: Communication of feelings regarding changes in body function or appearance will improve Outcome: Progressing   Problem: Skin Integrity: Goal: Demonstration of wound healing without infection will improve Outcome: Progressing   Problem: Activity: Goal: Ability to tolerate increased activity will improve Outcome: Progressing   Problem: Respiratory: Goal: Ability to maintain a clear airway and adequate ventilation will improve Outcome: Progressing  Problem: Clinical Measurements: Goal: Ability to maintain clinical measurements within normal limits will improve Outcome: Progressing Goal: Will remain free from infection Outcome: Progressing Goal: Diagnostic test results will improve Outcome: Progressing Goal: Respiratory complications will improve Outcome: Progressing Goal: Cardiovascular complication will be avoided Outcome: Progressing

## 2022-12-01 NOTE — Progress Notes (Signed)
Physical Therapy Treatment Patient Details Name: Meghan Contreras MRN: [id number] DOB: June 19, 1965 Today's Date: 12/01/2022   History of Present Illness Pt is a 57 y/o female admitted 9/28 with acute onset of SOB with progressive decline in spite of bronchodilator use to finally intubated.  Tracheostomy 9/29.  PMHx, anxiety, Bipolar d/o, COPD, depression, HOH, opiate addiction, PTSD, tobacco dependence.    PT Comments  Patient is cooperative and eager to walk. Patient ambulated in hallway with contact guard assistance with no loss of balance. Cues for energy conservation strategies, taking rest breaks as needed, pacing for endurance. Sp02 remained 96% with Fi02 35% at 6L02 with hallway ambulation. Patient is making excellent progress towards meeting PT goals.     If plan is discharge home, recommend the following: A little help with bathing/dressing/bathroom;Assistance with cooking/housework;Assist for transportation   Can travel by private vehicle        Equipment Recommendations  None recommended by PT    Recommendations for Other Services       Precautions / Restrictions Precautions Precautions: Other (comment) (low fall risk) Precaution Comments: Monitor O2, relatively low fall risk     Mobility  Bed Mobility Overal bed mobility: Needs Assistance Bed Mobility: Supine to Sit     Supine to sit: Supervision          Transfers Overall transfer level: Needs assistance Equipment used: None Transfers: Sit to/from Stand, Bed to chair/wheelchair/BSC Sit to Stand: Contact guard assist   Step pivot transfers: Contact guard assist       General transfer comment: with standing from bed, recliner, and bed side commode. verbal cues for safety    Ambulation/Gait Ambulation/Gait assistance: Contact guard assist Gait Distance (Feet): 350 Feet Assistive device: None Gait Pattern/deviations: Step-through pattern Gait velocity: decreased     General Gait Details: patient  ambulated without assistive device with slow but steady cadence and no loss of balance. Sp02 remained around 96% with ambulation on 35% Fi02 at 6L02. cues provided for energy conservation strategies, pacing for endurance, taking rest breaks as needed   Stairs             Wheelchair Mobility     Tilt Bed    Modified Rankin (Stroke Patients Only)       Balance Overall balance assessment: Needs assistance Sitting-balance support: No upper extremity supported, Feet supported Sitting balance-Leahy Scale: Good Sitting balance - Comments: patient is able to reach outside base of support and weight shift while sitting on bed side commode for peri-care with no loss of balance   Standing balance support: No upper extremity supported Standing balance-Leahy Scale: Fair                              Cognition Arousal: Alert Behavior During Therapy: WFL for tasks assessed/performed Overall Cognitive Status: Within Functional Limits for tasks assessed                                          Exercises      General Comments General comments (skin integrity, edema, etc.): PMSV in place. spouse present at end of session and very supportive      Pertinent Vitals/Pain Pain Assessment Pain Assessment: No/denies pain    Home Living  Prior Function            PT Goals (current goals can now be found in the care plan section) Acute Rehab PT Goals Patient Stated Goal: to return home soon with spouse PT Goal Formulation: With patient Time For Goal Achievement: 12/14/22 Potential to Achieve Goals: Good Progress towards PT goals: Progressing toward goals    Frequency    Min 1X/week      PT Plan      Co-evaluation              AM-PAC PT "6 Clicks" Mobility   Outcome Measure  Help needed turning from your back to your side while in a flat bed without using bedrails?: None Help needed moving from  lying on your back to sitting on the side of a flat bed without using bedrails?: A Little Help needed moving to and from a bed to a chair (including a wheelchair)?: A Little Help needed standing up from a chair using your arms (e.g., wheelchair or bedside chair)?: A Little Help needed to walk in hospital room?: A Little Help needed climbing 3-5 steps with a railing? : A Little 6 Click Score: 19    End of Session Equipment Utilized During Treatment: Oxygen Activity Tolerance: Patient tolerated treatment well Patient left: in chair;with call bell/phone within reach;with family/visitor present Nurse Communication: Mobility status PT Visit Diagnosis: Unsteadiness on feet (R26.81);Other abnormalities of gait and mobility (R26.89)     Time: 1150-1230 PT Time Calculation (min) (ACUTE ONLY): 40 min  Charges:    $Therapeutic Activity: 38-52 mins PT General Charges $$ ACUTE PT VISIT: 1 Visit                     [name], PT, MPT   [name] 12/01/2022, 1:09 PM

## 2022-12-01 NOTE — Anesthesia Postprocedure Evaluation (Signed)
Anesthesia Post Note  Patient: Hina Gupta  Procedure(s) Performed: DIRECT LARYNGOSCOPY TRACHEOSTOMY     Patient location during evaluation: Nursing Unit Anesthesia Type: General Level of consciousness: patient cooperative and oriented (pt sleepy) Pain management: pain level controlled Vital Signs Assessment: post-procedure vital signs reviewed and stable Respiratory status: nonlabored ventilation, spontaneous breathing, respiratory function stable and patient connected to tracheostomy mask oxygen Cardiovascular status: blood pressure returned to baseline and stable Postop Assessment: no apparent nausea or vomiting Anesthetic complications: no Comments: Pt much more stable with trach   No notable events documented.  Last Vitals:  Vitals:   12/01/22 1146 12/01/22 1445  BP:  107/75  Pulse: 62 65  Resp: 18 18  Temp:  36.9 C  SpO2: 93% 95%    Last Pain:  Vitals:   12/01/22 1445  TempSrc: Oral  PainSc: 0-No pain                 Nitin Mckowen,E. Yulonda [place]

## 2022-12-02 ENCOUNTER — Other Ambulatory Visit: Payer: Self-pay

## 2022-12-02 ENCOUNTER — Ambulatory Visit: Payer: 59 | Admitting: Internal Medicine

## 2022-12-02 DIAGNOSIS — J9602 Acute respiratory failure with hypercapnia: Secondary | ICD-10-CM | POA: Diagnosis not present

## 2022-12-02 LAB — CULTURE, BLOOD (ROUTINE X 2)
Culture: NO GROWTH
Culture: NO GROWTH
Special Requests: ADEQUATE
Special Requests: ADEQUATE

## 2022-12-02 LAB — BASIC METABOLIC PANEL
Anion gap: 10 (ref 5–15)
BUN: 22 mg/dL — ABNORMAL HIGH (ref 6–20)
CO2: 26 mmol/L (ref 22–32)
Calcium: 8.4 mg/dL — ABNORMAL LOW (ref 8.9–10.3)
Chloride: 102 mmol/L (ref 98–111)
Creatinine, Ser: 1.04 mg/dL — ABNORMAL HIGH (ref 0.44–1.00)
GFR, Estimated: >60 mL/min (ref >60)
Glucose, Bld: 102 mg/dL — ABNORMAL HIGH (ref 70–99)
Potassium: 3.7 mmol/L (ref 3.5–5.1)
Sodium: 138 mmol/L (ref 135–145)

## 2022-12-02 LAB — MAGNESIUM: Magnesium: 2.1 mg/dL (ref 1.7–2.4)

## 2022-12-02 MED ORDER — LEVOTHYROXINE SODIUM 75 MCG PO TABS
150.0000 ug | ORAL_TABLET | Freq: Every day | ORAL | Status: DC
  Administered 2022-12-03 – 2022-12-07 (×5): 150 ug via ORAL
  Filled 2022-12-02 (×6): qty 2

## 2022-12-02 NOTE — Plan of Care (Signed)
  Problem: Education: Goal: Knowledge of the prescribed therapeutic regimen will improve Outcome: Progressing Goal: Understanding of sexual limitations or changes related to disease process or condition will improve Outcome: Progressing Goal: Individualized Educational Video(s) Outcome: Progressing   Problem: Self-Concept: Goal: Communication of feelings regarding changes in body function or appearance will improve Outcome: Progressing   Problem: Skin Integrity: Goal: Demonstration of wound healing without infection will improve Outcome: Progressing   Problem: Activity: Goal: Ability to tolerate increased activity will improve Outcome: Progressing   Problem: Role Relationship: Goal: Method of communication will improve Outcome: Progressing   Problem: Respiratory: Goal: Ability to maintain a clear airway and adequate ventilation will improve Outcome: Progressing   Problem: Clinical Measurements: Goal: Ability to maintain clinical measurements within normal limits will improve Outcome: Progressing Goal: Will remain free from infection Outcome: Progressing Goal: Diagnostic test results will improve Outcome: Progressing Goal: Respiratory complications will improve Outcome: Progressing Goal: Cardiovascular complication will be avoided Outcome: Progressing

## 2022-12-02 NOTE — Progress Notes (Signed)
Occupational Therapy Treatment Patient Details Name: Meghan Contreras MRN: [id number] DOB: 09/03/1965 Today's Date: 12/02/2022   History of present illness Pt is a 57 y/o female admitted 9/28 with acute onset of SOB with progressive decline in spite of bronchodilator use to finally intubated.  Tracheostomy 9/29.  PMHx, anxiety, Bipolar d/o, COPD, depression, HOH, opiate addiction, PTSD, tobacco dependence.   OT comments  Pt making excellent progress towards OT goals, able to manage [facility] transfers and toileting tasks without physical assistance. Pt eager to walk in hallway to maximize endurance with ability to titrate from 6 L to 4 L O2 via trach collar w/ sats at 97%. Educated re: self monitoring of symptoms, energy conservation, good O2 levels. Close to meet acute OT goals soon.      If plan is discharge home, recommend the following:  Assistance with cooking/housework   Equipment Recommendations  None recommended by OT    Recommendations for Other Services      Precautions / Restrictions Precautions Precautions: Other (comment) Precaution Comments: Monitor O2, relatively low fall risk Restrictions Weight Bearing Restrictions: No       Mobility Bed Mobility               General bed mobility comments: on BSC on entry and left EOB on exit    Transfers Overall transfer level: Independent Equipment used: None Transfers: Sit to/from Stand Sit to Stand: Independent                 Balance Overall balance assessment: Needs assistance Sitting-balance support: No upper extremity supported, Feet supported Sitting balance-Leahy Scale: Good     Standing balance support: No upper extremity supported Standing balance-Leahy Scale: Good                             ADL either performed or assessed with clinical judgement   ADL Overall ADL's : Needs assistance/impaired                         Toilet Transfer: Modified  Actuary Details (indicate cue type and reason): on BSC on entry, able to pivot back to bed without assist and without LOB Toileting- Clothing Manipulation and Hygiene: Set up;Sitting/lateral lean;Sit to/from stand       Functional mobility during ADLs: Supervision/safety General ADL Comments: Focus on endurance with activity with O2 monitoring and weaning as appropriate. Provided energy conservation handout and education. Pt asking appropriate questions re: signs/symptoms of SOB/DOE, appropriate O2 levels and what to do if O2 low    Extremity/Trunk Assessment Upper Extremity Assessment Upper Extremity Assessment: Overall WFL for tasks assessed;Right hand dominant   Lower Extremity Assessment Lower Extremity Assessment: Defer to PT evaluation        Vision   Vision Assessment?: Wears glasses for reading   Perception     Praxis      Cognition Arousal: Alert Behavior During Therapy: [facility] for tasks assessed/performed Overall Cognitive Status: Within Functional Limits for tasks assessed                                          Exercises      Shoulder Instructions       General Comments Spouse present initially. on 6 L O2 initially with O2 98% during activity, trial of 4 L O2  with O2 97%. Consulted RN and left on 4 L O2    Pertinent Vitals/ Pain       Pain Assessment Pain Assessment: No/denies pain  Home Living                                          Prior Functioning/Environment              Frequency  Min 1X/week        Progress Toward Goals  OT Goals(current goals can now be found in the care plan section)  Progress towards OT goals: Progressing toward goals  Acute Rehab OT Goals Patient Stated Goal: stop smoking, wean O2 OT Goal Formulation: With patient/family Time For Goal Achievement: 12/13/22 Potential to Achieve Goals: Good ADL Goals Pt/caregiver will Perform Home  Exercise Program: Increased strength;Both right and left upper extremity;With theraband;Independently;With written HEP provided Additional ADL Goal #1: Pt to verbalize 3 energy conservation strategies to implement at home Additional ADL Goal #2: Pt to increase standing tolerance > 10 min during ADLs, IADLs and mobility  Plan      Co-evaluation                 AM-PAC OT "6 Clicks" Daily Activity     Outcome Measure   Help from another person eating meals?: None Help from another person taking care of personal grooming?: A Little Help from another person toileting, which includes using toliet, bedpan, or urinal?: A Little Help from another person bathing (including washing, rinsing, drying)?: A Little Help from another person to put on and taking off regular upper body clothing?: A Little Help from another person to put on and taking off regular lower body clothing?: A Little 6 Click Score: 19    End of Session Equipment Utilized During Treatment: Gait belt;Oxygen  OT Visit Diagnosis: Other abnormalities of gait and mobility (R26.89)   Activity Tolerance Patient tolerated treatment well   Patient Left in bed;with call bell/phone within reach   Nurse Communication Mobility status        Time: 7371-0626 OT Time Calculation (min): 30 min  Charges: OT General Charges $OT Visit: 1 Visit OT Treatments $Self Care/Home Management : 8-22 mins $Therapeutic Activity: 8-22 mins  Bradd Canary, OTR/L Acute Rehab Services Office: [phone]   Lorre Munroe 12/02/2022, 2:01 PM

## 2022-12-02 NOTE — Progress Notes (Signed)
PROGRESS NOTE    Meghan Contreras  YQI:[id number] DOB: [birth year] DOA: 11/27/2022 PCP: Julieanne Manson, MD   Brief Narrative:  57 y.o. 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  She was found to have right lower lobe pneumonia and COPD exacerbation and started on antibiotics and steroids.  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  ENT was consulted who performed direct laryngoscopy and tracheostomy on 11/28/2022.  She was transferred to [facility] service from 11/30/2022 onwards.  Assessment & Plan:   Acute respiratory failure with hypoxia and hypercapnia Secondary to pneumonia, COPD exacerbation complicated by upper airway obstruction.  Patient required intubation on 9/28 with eventual extubation on 9/29 after tracheostomy was placed.  Patient now on tracheostomy collar with 7 L/min supplemental oxygen.  Wean off as able.   Upper airway obstruction status post tracheostomy Left glottic mass Vocal cord paralysis Patient underwent direct laryngoscopy and tracheostomy by ENT on 9/29.  Biopsy of left glottic mass was obtained: Surgical pathology was benign -Trach care as per ENT.  Tube exchange planned on 12/03/2022 by ENT.  Will need outpatient follow-up with trach clinic -Diet as per SLP.  Right lower lobe pneumonia Patient was initially started empirically on Ceftriaxone/azithromycin with transition to Levaquin, secondary to B-lactam allergy, for treatment while in the ICU. -Continue Levaquin (plan for 7 day treatment course)   COPD exacerbation Patient managed on Solu-Medrol, Pulmicort, Brovana, Yupelri, addition to as needed albuterol. COPD exacerbation improved. -Continue Pulmicort, Roxy Manns -Pulmonology recommendation to  de-escalate steroids quickly.  Patient has been transitioned to oral prednisone.  Finish 5 to 7-day course of oral prednisone.  Outpatient follow-up with pulmonary.  Hyperglycemia -Possibly related to steroids.  -A1c 5.8.  Outpatient follow-up.  Hypothyroidism -Continue Synthroid   Tobacco use -Pulmonology recommendations for lung cancer screening referral at discharge.   Chronic pain -Patient is managed on methadone as an outpatient  -Methadone has been resumed.  Obesity -Outpatient follow-up  Physical deconditioning -PT recommended no PT follow-up.  Hypokalemia -Resolved  DVT prophylaxis: Lovenox Code Status: Full Family Communication: None at bedside Disposition Plan: Status is: Inpatient Remains inpatient appropriate because: Of severity of illness    Consultants: PCCM/ENT  Procedures: As above  Antimicrobials:  Anti-infectives (From admission, onward)    Start     Dose/Rate Route Frequency Ordered Stop   11/28/22 0300  levofloxacin (LEVAQUIN) IVPB 750 mg        750 mg 100 mL/hr over 90 Minutes Intravenous Every 24 hours 11/27/22 1107     11/27/22 0300  levofloxacin (LEVAQUIN) IVPB 500 mg  Status:  Discontinued        500 mg 100 mL/hr over 60 Minutes Intravenous Every 24 hours 11/27/22 0227 11/27/22 1107   11/27/22 0100  cefTRIAXone (ROCEPHIN) 2 g in sodium chloride 0.9 % 100 mL IVPB        2 g 200 mL/hr over 30 Minutes Intravenous  Once 11/27/22 0045 11/27/22 0141   11/27/22 0100  azithromycin (ZITHROMAX) 500 mg in sodium chloride 0.9 % 250 mL IVPB        500 mg 250 mL/hr over 60 Minutes Intravenous  Once 11/27/22 0045 11/27/22 0250        Subjective: Patient seen and examined at bedside.  No fever, chest pain, abdominal pain or  vomiting reported.  Still short of breath with exertion. Objective: Vitals:   12/02/22 0012 12/02/22 0300 12/02/22 0400 12/02/22 0440  BP: 130/82   103/66  Pulse: (!) 59 (!) 56  (!) 56  Resp: 18   18  Temp: 97.8 F (36.6  C)   97.8 F (36.6 C)  TempSrc: Oral   Oral  SpO2: 98% 96% 97% 98%  Weight:    83.3 kg  Height:        Intake/Output Summary (Last 24 hours) at 12/02/2022 0740 Last data filed at 12/01/2022 2006 Gross per 24 hour  Intake 480 ml  Output --  Net 480 ml   Filed Weights   11/28/22 1145 11/30/22 0507 12/02/22 0440  Weight: 84.1 kg 85.2 kg 83.3 kg    Examination:  General: On 6 L oxygen via trach.  No distress.  Chronically ill and deconditioned abdomen. ENT/neck: Janina Mayo present.  No thyromegaly.  JVD is not elevated  respiratory: Decreased breath sounds at bases bilaterally with some crackles; no wheezing  CVS: S1-S2 heard, mild intermittent bradycardia  abdominal: Soft, nontender, slightly distended; no organomegaly, normal bowel sounds are heard Extremities: Trace lower extremity edema; no cyanosis  CNS: Awake and alert.  Still slow to respond.  No focal neurologic deficit.  Moves extremities Lymph: No obvious lymphadenopathy Skin: No obvious ecchymosis/lesions  psych: Mostly flat affect.  Currently not agitated. musculoskeletal: No obvious joint swelling/deformity    Data Reviewed: I have personally reviewed following labs and imaging studies  CBC: Recent Labs  Lab 11/27/22 0046 11/27/22 0057 11/27/22 0258 11/27/22 0328 11/27/22 1143 11/29/22 0342 12/01/22 0252  WBC 7.2  --  8.5  --   --  10.5 8.3  NEUTROABS 4.6  --   --   --   --   --   --   HGB 12.6   < > 13.5 14.3 13.9 13.5 13.0  HCT 43.1   < > 44.4 42.0 41.0 42.9 40.9  MCV 83.7  --  83.3  --   --  78.1* 76.9*  PLT 244  --  237  --   --  247 217   < > = values in this interval not displayed.   Basic Metabolic Panel: Recent Labs  Lab 11/27/22 0046 11/27/22 0057 11/27/22 0258 11/27/22 0328 11/27/22 1143 11/29/22 0342 12/01/22 0252 12/02/22 0256  NA 139   < > 136 140 139 139 140 138  K 4.4   < > 4.1 4.1 4.0 3.6 3.3* 3.7  CL 100  --  100  --   --  101 104 102  CO2 32  --  28  --   --  27 28 26    GLUCOSE 142*  --  146*  --   --  80 110* 102*  BUN 22*  --  20  --   --  21* 21* 22*  CREATININE 1.00  --  0.93  --   --  0.92 1.01* 1.04*  CALCIUM 8.6*  --  7.9*  --   --  8.6* 8.7* 8.4*  MG  --   --   --   --   --  2.1  --  2.1  PHOS  --   --   --   --   --  3.7  --   --    < > = values in this interval not displayed.   GFR: Estimated Creatinine Clearance: 59.1 mL/min (A) (by C-G formula based on SCr of  1.04 mg/dL (H)). Liver Function Tests: Recent Labs  Lab 11/27/22 0046  AST 23  ALT 18  ALKPHOS 67  BILITOT 0.8  PROT 7.5  ALBUMIN 3.5   No results for input(s): "LIPASE", "AMYLASE" in the last 168 hours. No results for input(s): "AMMONIA" in the last 168 hours. Coagulation Profile: Recent Labs  Lab 11/27/22 0046  INR 1.1   Cardiac Enzymes: No results for input(s): "CKTOTAL", "CKMB", "CKMBINDEX", "TROPONINI" in the last 168 hours. BNP (last 3 results) No results for input(s): "PROBNP" in the last 8760 hours. HbA1C: Recent Labs    12/01/22 0252  HGBA1C 5.8*   CBG: Recent Labs  Lab 11/29/22 0000 11/29/22 0342 11/29/22 0852 11/29/22 1235 11/29/22 1630  GLUCAP 94 76 73 94 108*   Lipid Profile: No results for input(s): "CHOL", "HDL", "LDLCALC", "TRIG", "CHOLHDL", "LDLDIRECT" in the last 72 hours. Thyroid Function Tests: No results for input(s): "TSH", "T4TOTAL", "FREET4", "T3FREE", "THYROIDAB" in the last 72 hours. Anemia Panel: No results for input(s): "VITAMINB12", "FOLATE", "FERRITIN", "TIBC", "IRON", "RETICCTPCT" in the last 72 hours. Sepsis Labs: Recent Labs  Lab 11/27/22 0056  LATICACIDVEN 0.6    Recent Results (from the past 240 hour(s))  Blood Culture (routine x 2)     Status: None (Preliminary result)   Collection Time: 11/27/22 12:46 AM   Specimen: BLOOD RIGHT HAND  Result Value Ref Range Status   Specimen Description BLOOD RIGHT HAND  Final   Special Requests   Final    BOTTLES DRAWN AEROBIC AND ANAEROBIC Blood Culture adequate volume    Culture   Final    NO GROWTH 4 DAYS Performed at [facility], [address] [place], [place] [address]    Report Status PENDING  Incomplete  Resp panel by RT-PCR (RSV, Flu A&B, Covid) Anterior Nasal Swab     Status: None   Collection Time: 11/27/22  1:00 AM   Specimen: Anterior Nasal Swab  Result Value Ref Range Status   SARS Coronavirus 2 by RT PCR NEGATIVE NEGATIVE Final   Influenza A by PCR NEGATIVE NEGATIVE Final   Influenza B by PCR NEGATIVE NEGATIVE Final    Comment: (NOTE) The Xpert Xpress SARS-CoV-2/FLU/RSV plus assay is intended as an aid in the diagnosis of influenza from Nasopharyngeal swab specimens and should not be used as a sole basis for treatment. Nasal washings and aspirates are unacceptable for Xpert Xpress SARS-CoV-2/FLU/RSV testing.  Fact Sheet for Patients: BloggerCourse.com  Fact Sheet for Healthcare Providers: SeriousBroker.it  This test is not yet approved or cleared by the [place] FDA and has been authorized for detection and/or diagnosis of SARS-CoV-2 by FDA under an Emergency Use Authorization (EUA). This EUA will remain in effect (meaning this test can be used) for the duration of the COVID-19 declaration under Section 564(b)(1) of the Act, 21 U.S.C. section 360bbb-3(b)(1), unless the authorization is terminated or revoked.     Resp Syncytial Virus by PCR NEGATIVE NEGATIVE Final    Comment: (NOTE) Fact Sheet for Patients: BloggerCourse.com  Fact Sheet for Healthcare Providers: SeriousBroker.it  This test is not yet approved or cleared by the [place] FDA and has been authorized for detection and/or diagnosis of SARS-CoV-2 by FDA under an Emergency Use Authorization (EUA). This EUA will remain in effect (meaning this test can be used) for the duration of the COVID-19 declaration under Section 564(b)(1) of the Act, 21  U.S.C. section 360bbb-3(b)(1), unless the authorization is terminated or revoked.  Performed at [facility],  [address], [place], [place] [address]   Blood Culture (routine x 2)     Status: None (Preliminary result)   Collection Time: 11/27/22  1:04 AM   Specimen: BLOOD LEFT HAND  Result Value Ref Range Status   Specimen Description BLOOD LEFT HAND  Final   Special Requests   Final    BOTTLES DRAWN AEROBIC AND ANAEROBIC Blood Culture adequate volume   Culture   Final    NO GROWTH 4 DAYS Performed at [facility], [address] [place], [place] [address]    Report Status PENDING  Incomplete  MRSA Next Gen by PCR, Nasal     Status: None   Collection Time: 11/27/22  3:04 AM   Specimen: Nasal Mucosa; Nasal Swab  Result Value Ref Range Status   MRSA by PCR Next Gen NOT DETECTED NOT DETECTED Final    Comment: (NOTE) The GeneXpert MRSA Assay (FDA approved for NASAL specimens only), is one component of a comprehensive MRSA colonization surveillance program. It is not intended to diagnose MRSA infection nor to guide or monitor treatment for MRSA infections. Test performance is not FDA approved in patients less than 23 years old. Performed at [facility], [address], [place], [place] [address]          Radiology Studies: No results found.      Scheduled Meds:  arformoterol  15 mcg Nebulization BID   budesonide (PULMICORT) nebulizer solution  0.5 mg Nebulization BID   Chlorhexidine Gluconate Cloth  6 each Topical Daily   enoxaparin (LOVENOX) injection  40 mg Subcutaneous Q24H   feeding supplement  237 mL Oral Q24H   furosemide  20 mg Intravenous Once   levothyroxine  137 mcg Oral QAC breakfast   methadone  55 mg Oral Daily   multivitamin with minerals  1 tablet Oral Daily   nicotine  21 mg Transdermal Daily   mouth rinse  15 mL Mouth Rinse 4 times per day   predniSONE  40 mg Oral Q breakfast   QUEtiapine  150 mg Oral Daily   And    QUEtiapine  100 mg Oral QHS   revefenacin  175 mcg Nebulization Daily   Continuous Infusions:  sodium chloride Stopped (11/30/22 0913)   levofloxacin (LEVAQUIN) IV 750 mg (12/02/22 0448)          Glade Lloyd, MD [facility] 12/02/2022, 7:40 AM

## 2022-12-02 NOTE — Progress Notes (Signed)
Mobility Specialist Progress Note:   12/02/22 1614  Mobility  Activity Ambulated with assistance in hallway  Level of Assistance Standby assist, set-up cues, supervision of patient - no hands on  Assistive Device None  Distance Ambulated (ft) 475 ft  Activity Response Tolerated well  Mobility Referral Yes  $Mobility charge 1 Mobility  Mobility Specialist Start Time (ACUTE ONLY) 1530  Mobility Specialist Stop Time (ACUTE ONLY) 1545  Mobility Specialist Time Calculation (min) (ACUTE ONLY) 15 min   Pre Mobility: 71 HR , 95% SpO2 4 L During Mobility: 81 HR , 88%-97%  SpO2 4 L  Post Mobility: 74 HR , 95% SpO2 4 L  Pt received in bed, agreeable to mobility. Desat to 88% on 4 L but quickly elevated >90%. Pt denied any SOB or discomfort during ambulation, asymptomatic throughout. Pt returned to bed with call bell in reach and all needs met. Family present.   Leory Plowman  Mobility Specialist Please contact via [employer] office at [phone]

## 2022-12-03 DIAGNOSIS — J9602 Acute respiratory failure with hypercapnia: Secondary | ICD-10-CM | POA: Diagnosis not present

## 2022-12-03 MED ORDER — QUETIAPINE FUMARATE 100 MG PO TABS
300.0000 mg | ORAL_TABLET | Freq: Every day | ORAL | Status: DC
  Administered 2022-12-04 – 2022-12-06 (×3): 300 mg via ORAL
  Filled 2022-12-03 (×4): qty 3

## 2022-12-03 MED ORDER — DOCUSATE SODIUM 100 MG PO CAPS
100.0000 mg | ORAL_CAPSULE | Freq: Two times a day (BID) | ORAL | Status: DC
  Administered 2022-12-03: 100 mg via ORAL
  Filled 2022-12-03: qty 1

## 2022-12-03 MED ORDER — LACTULOSE 10 GM/15ML PO SOLN
20.0000 g | Freq: Two times a day (BID) | ORAL | Status: DC | PRN
  Administered 2022-12-03 – 2022-12-04 (×2): 20 g via ORAL
  Filled 2022-12-03 (×2): qty 30

## 2022-12-03 MED ORDER — QUETIAPINE FUMARATE 50 MG PO TABS
150.0000 mg | ORAL_TABLET | Freq: Every day | ORAL | Status: DC
  Administered 2022-12-03 – 2022-12-07 (×5): 150 mg via ORAL
  Filled 2022-12-03 (×5): qty 1

## 2022-12-03 MED ORDER — QUETIAPINE FUMARATE 100 MG PO TABS
300.0000 mg | ORAL_TABLET | Freq: Once | ORAL | Status: AC
  Administered 2022-12-03: 300 mg via ORAL
  Filled 2022-12-03: qty 3

## 2022-12-03 NOTE — TOC Progression Note (Addendum)
Transition of Care [facility]) - Progression Note    Patient Details  Name: Meghan Contreras MRN: [id number] Date of Birth: 26-Nov-1965  Transition of [facility]) CM/SW Contact  Lockie Pares, RN Phone Number: 12/03/2022, 2:27 PM  Clinical Narrative:     Touched base with [facility], they cannot accept. Reached out to Derwood from [place] to see if can accept for [facility]. [address] can not accept           [facility] declined           [facility] not in network with [employer]          [facility]-          [facility]- Expected Discharge Plan: Home w Home Health Services Barriers to Discharge: Continued Medical Work up  Expected Discharge Plan and Services In-house Referral: NA Discharge Planning Services: CM Consult Post Acute Care Choice: Home Health Living arrangements for the past 2 months: Single Family Home                 DME Arranged: Trach supplies DME Agency: [employer] Date DME Agency Contacted: 11/30/22 Time DME Agency Contacted: [phone] Representative spoke with at DME Agency: Marthann Schiller HH Arranged: RN HH Agency: [facility] Date [facility] Agency Contacted: 11/30/22 Time HH Agency Contacted: 1527 Representative spoke with at [facility] Agency: Amy   Social Determinants of Health (SDOH) Interventions SDOH Screenings   Food Insecurity: Patient Unable To Answer (11/27/2022)  Housing: Patient Unable To Answer (02/11/2022)  Transportation Needs: No Transportation Needs (02/11/2022)  Utilities: Not At Risk (02/11/2022)  Depression (PHQ2-9): Low Risk  (12/30/2021)  Financial Resource Strain: Medium Risk (02/11/2022)  Tobacco Use: High Risk (11/28/2022)    Readmission Risk Interventions    11/29/2022    1:20 PM  Readmission Risk Prevention Plan  Transportation Screening Complete  HRI or Home Care Consult Complete  Social Work Consult for Recovery Care Planning/Counseling Complete  Palliative Care Screening Not Applicable  Medication Review Oceanographer) Referral to Pharmacy

## 2022-12-03 NOTE — Plan of Care (Signed)
Pt very knowledgeable regarding medications and medical regimen

## 2022-12-03 NOTE — Progress Notes (Signed)
PROGRESS NOTE    Meghan Contreras  ZOX:[id number] DOB: 1965-06-29 DOA: 11/27/2022 PCP: Julieanne Manson, MD   Brief Narrative:  57 y.o. 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  She was found to have right lower lobe pneumonia and COPD exacerbation and started on antibiotics and steroids.  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  ENT was consulted who performed direct laryngoscopy and tracheostomy on 11/28/2022.  She was transferred to [facility] service from 11/30/2022 onwards.  Assessment & Plan:   Acute respiratory failure with hypoxia and hypercapnia Secondary to pneumonia, COPD exacerbation complicated by upper airway obstruction.  Patient required intubation on 9/28 with eventual extubation on 9/29 after tracheostomy was placed.  Patient now on tracheostomy collar with 6 L/min supplemental oxygen.  Wean off as able.   Upper airway obstruction status post tracheostomy Left glottic mass Vocal cord paralysis Patient underwent direct laryngoscopy and tracheostomy by ENT on 9/29.  Biopsy of left glottic mass was obtained: Surgical pathology was benign -Trach care as per ENT.  Tube exchange planned on 12/03/2022 by ENT.  Will need outpatient follow-up with trach clinic -Diet as per SLP.  Right lower lobe pneumonia Patient was initially started empirically on Ceftriaxone/azithromycin with transition to Levaquin, secondary to B-lactam allergy, for treatment while in the ICU. -Has completed a course of Levaquin.  DC Levaquin with   COPD exacerbation Patient managed on Solu-Medrol, Pulmicort, Brovana, Yupelri, addition to as needed albuterol. COPD exacerbation improved. -Continue Pulmicort, Roxy Manns -Pulmonology recommendation to  de-escalate steroids quickly.  Patient has been transitioned to oral prednisone.  Finish 5 to 7-day course of oral prednisone.  Outpatient follow-up with pulmonary.  Hyperglycemia -Possibly related to steroids.  -A1c 5.8.  Outpatient follow-up.  Hypothyroidism -Continue Synthroid   Tobacco use -Pulmonology recommendations for lung cancer screening referral at discharge.   Chronic pain -Patient is managed on methadone as an outpatient  -Methadone has been resumed.  Obesity -Outpatient follow-up  Physical deconditioning -PT recommended no PT follow-up.  Hypokalemia -Resolved  DVT prophylaxis: Lovenox Code Status: Full Family Communication: None at bedside Disposition Plan: Status is: Inpatient Remains inpatient appropriate because: Of severity of illness    Consultants: PCCM/ENT  Procedures: As above  Antimicrobials:  Anti-infectives (From admission, onward)    Start     Dose/Rate Route Frequency Ordered Stop   11/28/22 0300  levofloxacin (LEVAQUIN) IVPB 750 mg        750 mg 100 mL/hr over 90 Minutes Intravenous Every 24 hours 11/27/22 1107     11/27/22 0300  levofloxacin (LEVAQUIN) IVPB 500 mg  Status:  Discontinued        500 mg 100 mL/hr over 60 Minutes Intravenous Every 24 hours 11/27/22 0227 11/27/22 1107   11/27/22 0100  cefTRIAXone (ROCEPHIN) 2 g in sodium chloride 0.9 % 100 mL IVPB        2 g 200 mL/hr over 30 Minutes Intravenous  Once 11/27/22 0045 11/27/22 0141   11/27/22 0100  azithromycin (ZITHROMAX) 500 mg in sodium chloride 0.9 % 250 mL IVPB        500 mg 250 mL/hr over 60 Minutes Intravenous  Once 11/27/22 0045 11/27/22 0250        Subjective: Patient seen and examined at bedside.  Short of breath with exertion.  No chest pain, fever or vomiting reported.   Objective: Vitals:   12/03/22 0110 12/03/22 0240 12/03/22 0436 12/03/22 0725  BP:   (!) 134/93   Pulse:  64 (!) 57 67  Resp:  16 16 17   Temp:   98.2 F (36.8 C)   TempSrc:   Oral    SpO2:   98% 96%  Weight: 80.1 kg     Height:       No intake or output data in the 24 hours ending 12/03/22 0802  Filed Weights   11/30/22 0507 12/02/22 0440 12/03/22 0110  Weight: 85.2 kg 83.3 kg 80.1 kg    Examination:  General: No acute distress.  Still on 6 L oxygen via trach.  Chronically ill and deconditioned abdomen. ENT/neck: Janina Mayo present.  No JVD elevation noted  respiratory: Bilateral decreased breath sounds at bases with scattered crackles CVS: Currently rate controlled; S1 and S2 are heard  abdominal: Soft, nontender, distended mildly; no organomegaly, bowel sounds are heard  extremities: No clubbing; mild lower extremity edema present  CNS: Alert and awake.  No obvious focal deficits noted  lymph: No palpable lymphadenopathy  skin: No obvious petechiae/rashes psych: Not agitated currently.  Flat affect mostly.   Musculoskeletal: No obvious joint erythema/tenderness    Data Reviewed: I have personally reviewed following labs and imaging studies  CBC: Recent Labs  Lab 11/27/22 0046 11/27/22 0057 11/27/22 0258 11/27/22 0328 11/27/22 1143 11/29/22 0342 12/01/22 0252  WBC 7.2  --  8.5  --   --  10.5 8.3  NEUTROABS 4.6  --   --   --   --   --   --   HGB 12.6   < > 13.5 14.3 13.9 13.5 13.0  HCT 43.1   < > 44.4 42.0 41.0 42.9 40.9  MCV 83.7  --  83.3  --   --  78.1* 76.9*  PLT 244  --  237  --   --  247 217   < > = values in this interval not displayed.   Basic Metabolic Panel: Recent Labs  Lab 11/27/22 0046 11/27/22 0057 11/27/22 0258 11/27/22 0328 11/27/22 1143 11/29/22 0342 12/01/22 0252 12/02/22 0256  NA 139   < > 136 140 139 139 140 138  K 4.4   < > 4.1 4.1 4.0 3.6 3.3* 3.7  CL 100  --  100  --   --  101 104 102  CO2 32  --  28  --   --  27 28 26   GLUCOSE 142*  --  146*  --   --  80 110* 102*  BUN 22*  --  20  --   --  21* 21* 22*  CREATININE 1.00  --  0.93  --   --  0.92 1.01* 1.04*  CALCIUM 8.6*  --  7.9*  --   --  8.6* 8.7* 8.4*  MG  --    --   --   --   --  2.1  --  2.1  PHOS  --   --   --   --   --  3.7  --   --    < > = values in this interval not displayed.   GFR: Estimated Creatinine Clearance: 57.9 mL/min (A) (by C-G formula based on SCr of 1.04 mg/dL (H)). Liver Function Tests: Recent Labs  Lab 11/27/22 0046  AST 23  ALT 18  ALKPHOS 67  BILITOT 0.8  PROT 7.5  ALBUMIN 3.5   No results for input(s): "LIPASE", "AMYLASE" in the last 168 hours. No results for input(s): "AMMONIA" in the last 168 hours. Coagulation Profile: Recent Labs  Lab 11/27/22 0046  INR 1.1   Cardiac Enzymes: No results for input(s): "CKTOTAL", "CKMB", "CKMBINDEX", "TROPONINI" in the last 168 hours. BNP (last 3 results) No results for input(s): "PROBNP" in the last 8760 hours. HbA1C: Recent Labs    12/01/22 0252  HGBA1C 5.8*   CBG: Recent Labs  Lab 11/29/22 0000 11/29/22 0342 11/29/22 0852 11/29/22 1235 11/29/22 1630  GLUCAP 94 76 73 94 108*   Lipid Profile: No results for input(s): "CHOL", "HDL", "LDLCALC", "TRIG", "CHOLHDL", "LDLDIRECT" in the last 72 hours. Thyroid Function Tests: No results for input(s): "TSH", "T4TOTAL", "FREET4", "T3FREE", "THYROIDAB" in the last 72 hours. Anemia Panel: No results for input(s): "VITAMINB12", "FOLATE", "FERRITIN", "TIBC", "IRON", "RETICCTPCT" in the last 72 hours. Sepsis Labs: Recent Labs  Lab 11/27/22 0056  LATICACIDVEN 0.6    Recent Results (from the past 240 hour(s))  Blood Culture (routine x 2)     Status: None   Collection Time: 11/27/22 12:46 AM   Specimen: BLOOD RIGHT HAND  Result Value Ref Range Status   Specimen Description BLOOD RIGHT HAND  Final   Special Requests   Final    BOTTLES DRAWN AEROBIC AND ANAEROBIC Blood Culture adequate volume   Culture   Final    NO GROWTH 5 DAYS Performed at [facility], [address], [place], [place] [address]    Report Status 12/02/2022 FINAL  Final  Resp panel by RT-PCR (RSV, Flu A&B, Covid) Anterior Nasal Swab      Status: None   Collection Time: 11/27/22  1:00 AM   Specimen: Anterior Nasal Swab  Result Value Ref Range Status   SARS Coronavirus 2 by RT PCR NEGATIVE NEGATIVE Final   Influenza A by PCR NEGATIVE NEGATIVE Final   Influenza B by PCR NEGATIVE NEGATIVE Final    Comment: (NOTE) The Xpert Xpress SARS-CoV-2/FLU/RSV plus assay is intended as an aid in the diagnosis of influenza from Nasopharyngeal swab specimens and should not be used as a sole basis for treatment. Nasal washings and aspirates are unacceptable for Xpert Xpress SARS-CoV-2/FLU/RSV testing.  Fact Sheet for Patients: BloggerCourse.com  Fact Sheet for Healthcare Providers: SeriousBroker.it  This test is not yet approved or cleared by the [place] FDA and has been authorized for detection and/or diagnosis of SARS-CoV-2 by FDA under an Emergency Use Authorization (EUA). This EUA will remain in effect (meaning this test can be used) for the duration of the COVID-19 declaration under Section 564(b)(1) of the Act, 21 U.S.C. section 360bbb-3(b)(1), unless the authorization is terminated or revoked.     Resp Syncytial Virus by PCR NEGATIVE NEGATIVE Final    Comment: (NOTE) Fact Sheet for Patients: BloggerCourse.com  Fact Sheet for Healthcare Providers: SeriousBroker.it  This test is not yet approved or cleared by the [place] FDA and has been authorized for detection and/or diagnosis of SARS-CoV-2 by FDA under an Emergency Use Authorization (EUA). This EUA will remain in effect (meaning this test can be used) for the duration of the COVID-19 declaration under Section 564(b)(1) of the Act, 21 U.S.C. section 360bbb-3(b)(1), unless the authorization is terminated or revoked.  Performed at [facility], [address], [place], [place] [address]   Blood Culture (routine x 2)     Status: None   Collection Time:  11/27/22  1:04 AM  Specimen: BLOOD LEFT HAND  Result Value Ref Range Status   Specimen Description BLOOD LEFT HAND  Final   Special Requests   Final    BOTTLES DRAWN AEROBIC AND ANAEROBIC Blood Culture adequate volume   Culture   Final    NO GROWTH 5 DAYS Performed at [facility], [address], [place], [place] [address]    Report Status 12/02/2022 FINAL  Final  MRSA Next Gen by PCR, Nasal     Status: None   Collection Time: 11/27/22  3:04 AM   Specimen: Nasal Mucosa; Nasal Swab  Result Value Ref Range Status   MRSA by PCR Next Gen NOT DETECTED NOT DETECTED Final    Comment: (NOTE) The GeneXpert MRSA Assay (FDA approved for NASAL specimens only), is one component of a comprehensive MRSA colonization surveillance program. It is not intended to diagnose MRSA infection nor to guide or monitor treatment for MRSA infections. Test performance is not FDA approved in patients less than 32 years old. Performed at [facility], [address] [place], [place] [address]          Radiology Studies: No results found.      Scheduled Meds:  arformoterol  15 mcg Nebulization BID   budesonide (PULMICORT) nebulizer solution  0.5 mg Nebulization BID   Chlorhexidine Gluconate Cloth  6 each Topical Daily   enoxaparin (LOVENOX) injection  40 mg Subcutaneous Q24H   feeding supplement  237 mL Oral Q24H   furosemide  20 mg Intravenous Once   levothyroxine  150 mcg Oral QAC breakfast   methadone  55 mg Oral Daily   multivitamin with minerals  1 tablet Oral Daily   nicotine  21 mg Transdermal Daily   mouth rinse  15 mL Mouth Rinse 4 times per day   predniSONE  40 mg Oral Q breakfast   QUEtiapine  150 mg Oral Daily   And   QUEtiapine  300 mg Oral QHS   revefenacin  175 mcg Nebulization Daily   Continuous Infusions:  sodium chloride Stopped (11/30/22 0913)   levofloxacin (LEVAQUIN) IV 750 mg (12/03/22 0317)          Glade Lloyd, MD [facility] 12/03/2022, 8:02 AM

## 2022-12-03 NOTE — Progress Notes (Signed)
TRH night cross cover note:   I was notified by RN of the patient's request for increase in her dose of evening Seroquel from the current order for 100 mg p.o. nightly to her outpatient dose of 300 mg p.o. nightly.  Per my brief chart review, on 11/30/2022, the patient requested that her home evening Seroquel dose be decreased to 100 mg.   I subsequently adjusted her current order for nightly Seroquel to reflect her requested evening dosage of 300 mg p.o. nightly.  This is in addition to her existing daytime order for Seroquel, which is 150 mg p.o. daily    Newton Pigg, DO Hospitalist

## 2022-12-03 NOTE — Progress Notes (Signed)
RT at bedside to be on stby for ENT. ENT removed #6 shiley flex cuff trach. Placed #4 shiley flex cuffless trach. Procedure went well. Pt tolerated well. Head of bed sheet updated with new trach info. All needed trach supplies at bedside.

## 2022-12-03 NOTE — Progress Notes (Signed)
TRH night cross cover note:   I was notified by RN that the patient is complaining of constipation, with no bowel movement since day of admission on 11/27/2022, in spite of prn doses of MiraLAX on each of the last 3 days.  The patient is requesting expansion of her oral bowel regimen.  I subsequently added scheduled Colace on a twice daily basis as well as added as needed lactulose for constipation.    Newton Pigg, DO Hospitalist

## 2022-12-03 NOTE — Progress Notes (Signed)
Mobility Specialist Progress Note:   12/03/22 1000  Mobility  Activity Ambulated with assistance in hallway  Level of Assistance Standby assist, set-up cues, supervision of patient - no hands on  Assistive Device None  Distance Ambulated (ft) 475 ft  Activity Response Tolerated well  Mobility Referral Yes  $Mobility charge 1 Mobility  Mobility Specialist Start Time (ACUTE ONLY) 1020  Mobility Specialist Stop Time (ACUTE ONLY) 1035  Mobility Specialist Time Calculation (min) (ACUTE ONLY) 15 min   Pre Mobility: 85 HR , 92% SpO2 RA During Mobility: 87%-93% SpO2 RA Post Mobility: 87 HR , 91% SpO2 RA  Pt found ambulating in room, agreeable to mobility. Pt desat to 87% on RA with good pleth but quickly elevated >90%. Pt denied any SOB or discomfort during ambulation, asymptomatic throughout. Pt left in bed with call bell in reach and all needs met. RN notified.   Leory Plowman  Mobility Specialist Please contact via [employer] office at [phone]

## 2022-12-03 NOTE — Procedures (Signed)
TRACHEOSTOMY TUBE EXCHANGE NOTE  ID: 57 y/o F with hx of right vocal cord paralysis (thyroidectomy [place] 2019), left vocal cord mass who presented with hypoxemic respiratory failure 11/27/22 s/p tracheostomy, DL biopsy 16/10/96/  PROCEDURE: Tracheostomy tube exchange  Findings: Shiley 6-0 cuffed tube replaced with shiley 4-0 cuffless tube. Stoma patent.  Procedure details:  The patient was placed in supine position. The tracheostomy sutures were removed and ties released. Appropriate tracheostomy replacement was prepared with obturator. The tracheostomy was removed and stoma suctioned. The new shiley 4-0 cuffless tube was inserted into the tracheostomy and obturator removed. A tracheostomy collar was placed securing the tube.  Recommendations: - Routine tracheostomy tube care; futher tracheostomy tube changes by RT or Primary team - Establish care with [facility] tracheostomy care clinic as outpatient - F/u with me in one month for in office flexible fiberoptic laryngoscopy to assess left vocal cord - Trache supplies form signed and faxed to DCP - Counseled patient on likely trach dependence on the next several months until laryngeal airway prognosis/clinical course determined with better clarity  Electronically signed by:  Scarlette Ar, MD  Staff Physician Facial Plastic & Reconstructive Surgery Otolaryngology - Head and Neck Surgery [facility] [place]

## 2022-12-03 NOTE — Progress Notes (Signed)
Speech Language Pathology Treatment: Dysphagia;Passy Muir Speaking valve  Patient Details Name: Meghan Contreras MRN: [id number] DOB: 03-21-1965 Today's Date: 12/03/2022 Time: 4098-1191 SLP Time Calculation (min) (ACUTE ONLY): 8 min  Assessment / Plan / Recommendation Clinical Impression  Pt sitting on side on bed with PMV donned. Pt's trach had just been downsized to #4 uncuffed prior to arrival. SLP attached leash to PMV and velcro on trach. Vocal quality clear- there is a little congestion at level of trach likely due to recent trach change although pt without needing to cough secretions. She demonstrated donning and doffing valve x 2 for therapist independently.  Observed pt taking multiple pills with thin liquids with RN without s/s aspiration. Functional and thorough mastication of graham cracker. Pt denies coughing with food or liquid. Recommend continue regular/thin, valve donned with meals and meds. She is independent and safe with po's. No further f/u with swallow needed at this time. ST will continue to see for brief follow up for continued PMV education.   HPI HPI: Meghan Contreras is a 57 y.o. year-old female with a history of COPD presenting to the ED with chief complaint of respiratory distress. Intubated 9/28-9/29 but noted consistent stridor and ENT consulted. 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 on 9/29. PMH vocal cord paralysis due to prior thyroid surgery, possible COPD      SLP Plan  Other (Comment) (met goals for swallow)      Recommendations for follow up therapy are one component of a multi-disciplinary discharge planning process, led by the attending physician.  Recommendations may be updated based on patient status, additional functional criteria and insurance authorization.    Recommendations  Diet recommendations: Regular;Thin liquid Liquids provided via:  Straw;Cup Medication Administration: Whole meds with liquid Supervision: Patient able to self feed Compensations: Slow rate;Small sips/bites Postural Changes and/or Swallow Maneuvers: Seated upright 90 degrees      Patient may use Passy-Muir Speech Valve: During all therapies with supervision PMSV Supervision: Intermittent           Oral care BID   Set up Supervision/Assistance Aphonia (R49.1);Dysphagia, oropharyngeal phase (R13.12)     Other (Comment) (met goals for swallow)     [name]  12/03/2022, 9:32 AM

## 2022-12-04 DIAGNOSIS — J9602 Acute respiratory failure with hypercapnia: Secondary | ICD-10-CM | POA: Diagnosis not present

## 2022-12-04 MED ORDER — SENNOSIDES-DOCUSATE SODIUM 8.6-50 MG PO TABS
1.0000 | ORAL_TABLET | Freq: Two times a day (BID) | ORAL | Status: DC
  Administered 2022-12-04 – 2022-12-07 (×7): 1 via ORAL
  Filled 2022-12-04 (×7): qty 1

## 2022-12-04 NOTE — Progress Notes (Signed)
PROGRESS NOTE    Meghan Contreras  UJW:[id number] DOB: 1965/08/24 DOA: 11/27/2022 PCP: Julieanne Manson, MD   Brief Narrative:  57 y.o. 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  She was found to have right lower lobe pneumonia and COPD exacerbation and started on antibiotics and steroids.  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  ENT was consulted who performed direct laryngoscopy and tracheostomy on 11/28/2022.  She was transferred to [facility] service from 11/30/2022 onwards.  Assessment & Plan:   Acute respiratory failure with hypoxia and hypercapnia Secondary to pneumonia, COPD exacerbation complicated by upper airway obstruction.  Patient required intubation on 9/28 with eventual extubation on 9/29 after tracheostomy was placed.  Patient now on tracheostomy collar with 3 L/min supplemental oxygen.  Wean off as able.   Upper airway obstruction status post tracheostomy Left glottic mass Vocal cord paralysis Patient underwent direct laryngoscopy and tracheostomy by ENT on 9/29.  Biopsy of left glottic mass was obtained: Surgical pathology was benign -Trach care as per ENT.  Tube exchange done on 12/03/2022 by ENT.  Will need outpatient follow-up with trach clinic -Diet as per SLP.  Right lower lobe pneumonia Patient was initially started empirically on Ceftriaxone/azithromycin with transition to Levaquin, secondary to B-lactam allergy, for treatment while in the ICU. -Has completed a course of Levaquin.    COPD exacerbation Patient managed on Solu-Medrol, Pulmicort, Brovana, Yupelri, addition to as needed albuterol. COPD exacerbation improved. -Continue Pulmicort, Roxy Manns -Pulmonology recommendation to de-escalate steroids  quickly.  Patient has been transitioned to oral prednisone.  Finish 5 to 7-day course of oral prednisone.  Outpatient follow-up with pulmonary.  Hyperglycemia -Possibly related to steroids.  -A1c 5.8.  Outpatient follow-up.  Hypothyroidism -Continue Synthroid   Tobacco use -Pulmonology recommendations for lung cancer screening referral at discharge.   Chronic pain/chronic opiate dependence -Patient is managed on methadone as an outpatient  -Continue methadone.  Constipation -Continue bowel regimen  Obesity -Outpatient follow-up  Physical deconditioning -PT recommended no PT follow-up.  Hypokalemia -Resolved  DVT prophylaxis: Lovenox Code Status: Full Family Communication: None at bedside Disposition Plan: Status is: Inpatient Remains inpatient appropriate because: Of severity of illness    Consultants: PCCM/ENT  Procedures: As above  Antimicrobials:  Anti-infectives (From admission, onward)    Start     Dose/Rate Route Frequency Ordered Stop   11/28/22 0300  levofloxacin (LEVAQUIN) IVPB 750 mg  Status:  Discontinued        750 mg 100 mL/hr over 90 Minutes Intravenous Every 24 hours 11/27/22 1107 12/03/22 0803   11/27/22 0300  levofloxacin (LEVAQUIN) IVPB 500 mg  Status:  Discontinued        500 mg 100 mL/hr over 60 Minutes Intravenous Every 24 hours 11/27/22 0227 11/27/22 1107   11/27/22 0100  cefTRIAXone (ROCEPHIN) 2 g in sodium chloride 0.9 % 100 mL IVPB        2 g 200 mL/hr over 30 Minutes Intravenous  Once 11/27/22 0045 11/27/22 0141   11/27/22 0100  azithromycin (ZITHROMAX) 500 mg in sodium chloride 0.9 % 250 mL IVPB        500 mg 250 mL/hr over 60 Minutes Intravenous  Once 11/27/22 0045 11/27/22 0250        Subjective: Patient seen and examined at bedside.  Denies worsening chest pain, fever, vomiting.  Feels slightly better.  Complains of constipation.   Objective: Vitals:   12/03/22 1633 12/03/22 1945 12/03/22 2359 12/04/22 0541  BP: 117/86  116/83 104/79 101/77  Pulse: 77 64 77 (!) 58  Resp: 18 17 18 17   Temp: 98.4 F (36.9 C) 98.3 F (36.8 C) (!) 97.4 F (36.3 C) (!) 97.5 F (36.4 C)  TempSrc: Oral Oral Oral Oral  SpO2: 96% 95% 95% 94%  Weight:    81.1 kg  Height:        Intake/Output Summary (Last 24 hours) at 12/04/2022 0807 Last data filed at 12/04/2022 0600 Gross per 24 hour  Intake 540 ml  Output --  Net 540 ml    Filed Weights   12/02/22 0440 12/03/22 0110 12/04/22 0541  Weight: 83.3 kg 80.1 kg 81.1 kg    Examination:  General: On 3 L oxygen via trach.  No distress.  Chronically ill and deconditioned looking. ENT/neck: Janina Mayo present.  No obvious JVD elevation noted  respiratory: Decreased breath sounds at bases bilaterally with some crackles  CVS: Mild intermittent bradycardia present; S1-S2 heard abdominal: Soft, nontender, slightly distended; no organomegaly, normal bowel sounds heard  extremities: Trace lower extremity edema present; no cyanosis  CNS: Awake and alert.  No obvious focal deficits noted  lymph: No lymphadenopathy palpable  skin: No obvious ecchymosis/lesions  psych: Flat affect.  Currently not agitated.   Musculoskeletal: No obvious joint swelling/deformity   Data Reviewed: I have personally reviewed following labs and imaging studies  CBC: Recent Labs  Lab 11/27/22 1143 11/29/22 0342 12/01/22 0252  WBC  --  10.5 8.3  HGB 13.9 13.5 13.0  HCT 41.0 42.9 40.9  MCV  --  78.1* 76.9*  PLT  --  247 217   Basic Metabolic Panel: Recent Labs  Lab 11/27/22 1143 11/29/22 0342 12/01/22 0252 12/02/22 0256  NA 139 139 140 138  K 4.0 3.6 3.3* 3.7  CL  --  101 104 102  CO2  --  27 28 26   GLUCOSE  --  80 110* 102*  BUN  --  21* 21* 22*  CREATININE  --  0.92 1.01* 1.04*  CALCIUM  --  8.6* 8.7* 8.4*  MG  --  2.1  --  2.1  PHOS  --  3.7  --   --    GFR: Estimated Creatinine Clearance: 58.3 mL/min (A) (by C-G formula based on SCr of 1.04 mg/dL (H)). Liver Function Tests: No  results for input(s): "AST", "ALT", "ALKPHOS", "BILITOT", "PROT", "ALBUMIN" in the last 168 hours.  No results for input(s): "LIPASE", "AMYLASE" in the last 168 hours. No results for input(s): "AMMONIA" in the last 168 hours. Coagulation Profile: No results for input(s): "INR", "PROTIME" in the last 168 hours.  Cardiac Enzymes: No results for input(s): "CKTOTAL", "CKMB", "CKMBINDEX", "TROPONINI" in the last 168 hours. BNP (last 3 results) No results for input(s): "PROBNP" in the last 8760 hours. HbA1C: No results for input(s): "HGBA1C" in the last 72 hours.  CBG: Recent Labs  Lab 11/29/22 0000 11/29/22 0342 11/29/22 0852 11/29/22 1235 11/29/22 1630  GLUCAP 94 76 73 94 108*   Lipid Profile: No results for input(s): "CHOL", "HDL", "LDLCALC", "TRIG", "CHOLHDL", "LDLDIRECT" in the last 72 hours. Thyroid Function Tests: No results for input(s): "TSH", "T4TOTAL", "FREET4", "T3FREE", "THYROIDAB" in the last 72 hours. Anemia Panel: No results for input(s): "VITAMINB12", "FOLATE", "FERRITIN", "TIBC", "IRON", "RETICCTPCT" in the last 72 hours. Sepsis Labs: No results  for input(s): "PROCALCITON", "LATICACIDVEN" in the last 168 hours.   Recent Results (from the past 240 hour(s))  Blood Culture (routine x 2)     Status: None   Collection Time: 11/27/22 12:46 AM   Specimen: BLOOD RIGHT HAND  Result Value Ref Range Status   Specimen Description BLOOD RIGHT HAND  Final   Special Requests   Final    BOTTLES DRAWN AEROBIC AND ANAEROBIC Blood Culture adequate volume   Culture   Final    NO GROWTH 5 DAYS Performed at [facility], [address] [place], [place] [address]    Report Status 12/02/2022 FINAL  Final  Resp panel by RT-PCR (RSV, Flu A&B, Covid) Anterior Nasal Swab     Status: None   Collection Time: 11/27/22  1:00 AM   Specimen: Anterior Nasal Swab  Result Value Ref Range Status   SARS Coronavirus 2 by RT PCR NEGATIVE NEGATIVE Final   Influenza A by PCR NEGATIVE  NEGATIVE Final   Influenza B by PCR NEGATIVE NEGATIVE Final    Comment: (NOTE) The Xpert Xpress SARS-CoV-2/FLU/RSV plus assay is intended as an aid in the diagnosis of influenza from Nasopharyngeal swab specimens and should not be used as a sole basis for treatment. Nasal washings and aspirates are unacceptable for Xpert Xpress SARS-CoV-2/FLU/RSV testing.  Fact Sheet for Patients: BloggerCourse.com  Fact Sheet for Healthcare Providers: SeriousBroker.it  This test is not yet approved or cleared by the [place] FDA and has been authorized for detection and/or diagnosis of SARS-CoV-2 by FDA under an Emergency Use Authorization (EUA). This EUA will remain in effect (meaning this test can be used) for the duration of the COVID-19 declaration under Section 564(b)(1) of the Act, 21 U.S.C. section 360bbb-3(b)(1), unless the authorization is terminated or revoked.     Resp Syncytial Virus by PCR NEGATIVE NEGATIVE Final    Comment: (NOTE) Fact Sheet for Patients: BloggerCourse.com  Fact Sheet for Healthcare Providers: SeriousBroker.it  This test is not yet approved or cleared by the [place] FDA and has been authorized for detection and/or diagnosis of SARS-CoV-2 by FDA under an Emergency Use Authorization (EUA). This EUA will remain in effect (meaning this test can be used) for the duration of the COVID-19 declaration under Section 564(b)(1) of the Act, 21 U.S.C. section 360bbb-3(b)(1), unless the authorization is terminated or revoked.  Performed at [facility], [address] [place], [place] [address]   Blood Culture (routine x 2)     Status: None   Collection Time: 11/27/22  1:04 AM   Specimen: BLOOD LEFT HAND  Result Value Ref Range Status   Specimen Description BLOOD LEFT HAND  Final   Special Requests   Final    BOTTLES DRAWN AEROBIC AND ANAEROBIC Blood  Culture adequate volume   Culture   Final    NO GROWTH 5 DAYS Performed at [facility], [address], [place], [place] [address]    Report Status 12/02/2022 FINAL  Final  MRSA Next Gen by PCR, Nasal     Status: None   Collection Time: 11/27/22  3:04 AM   Specimen: Nasal Mucosa; Nasal Swab  Result Value Ref Range Status   MRSA by PCR Next Gen NOT DETECTED NOT DETECTED Final    Comment: (NOTE) The GeneXpert MRSA Assay (FDA approved for NASAL specimens only), is one component of a comprehensive MRSA colonization surveillance program. It is not intended to diagnose MRSA infection nor to guide or monitor  treatment for MRSA infections. Test performance is not FDA approved in patients less than 7 years old. Performed at [facility], [address] [place], [place] [address]          Radiology Studies: No results found.      Scheduled Meds:  arformoterol  15 mcg Nebulization BID   budesonide (PULMICORT) nebulizer solution  0.5 mg Nebulization BID   Chlorhexidine Gluconate Cloth  6 each Topical Daily   enoxaparin (LOVENOX) injection  40 mg Subcutaneous Q24H   feeding supplement  237 mL Oral Q24H   furosemide  20 mg Intravenous Once   levothyroxine  150 mcg Oral QAC breakfast   methadone  55 mg Oral Daily   multivitamin with minerals  1 tablet Oral Daily   nicotine  21 mg Transdermal Daily   mouth rinse  15 mL Mouth Rinse 4 times per day   predniSONE  40 mg Oral Q breakfast   QUEtiapine  150 mg Oral Daily   And   QUEtiapine  300 mg Oral QHS   revefenacin  175 mcg Nebulization Daily   senna-docusate  1 tablet Oral BID   Continuous Infusions:  sodium chloride Stopped (11/30/22 0913)          Glade Lloyd, MD [facility] 12/04/2022, 8:07 AM

## 2022-12-04 NOTE — Progress Notes (Signed)
Physical Therapy Treatment Patient Details Name: Meghan Contreras MRN: [id number] DOB: April 21, 1965 Today's Date: 12/04/2022   History of Present Illness Pt is a 57 y/o female admitted 9/28 with acute onset of SOB with progressive decline in spite of bronchodilator use to finally intubated.  Tracheostomy 9/29.  PMHx, anxiety, Bipolar d/o, COPD, depression, HOH, opiate addiction, PTSD, tobacco dependence.    PT Comments  Pt greeted sleeping in bed, easily woken and agreeable to session with continued progress towards acute goals. Pt progressing gait distance with grossly supervision for safety with pt needing cues to slow gait speed as pt with tendency to increase gait speed with SpO2 dropping into low 80s on RA, recovering quickly with slowing of gait speed and cues fro breathing techniques. Educated pt on importance of self-pacing and self monitoring of symptoms with pt verbalizing understanding. Pt continues to benefit from skilled PT services to progress toward functional mobility goals.     If plan is discharge home, recommend the following: A little help with bathing/dressing/bathroom;Assistance with cooking/housework;Assist for transportation   Can travel by private vehicle        Equipment Recommendations  None recommended by PT    Recommendations for Other Services       Precautions / Restrictions Precautions Precautions: Other (comment) Precaution Comments: Monitor O2, relatively low fall risk Restrictions Weight Bearing Restrictions: No     Mobility  Bed Mobility Overal bed mobility: Needs Assistance Bed Mobility: Supine to Sit     Supine to sit: Supervision     General bed mobility comments: supervision for safety as pt with bathrrom urgency and moving quickly    Transfers Overall transfer level: Independent Equipment used: None Transfers: Sit to/from Stand Sit to Stand: Independent           General transfer comment: with standing from bed,and low commode     Ambulation/Gait Ambulation/Gait assistance: Supervision Gait Distance (Feet): 500 Feet Assistive device: None Gait Pattern/deviations: Step-through pattern Gait velocity: variable     General Gait Details: cues to slow as pt moving quickly with SpO2 dropping to 84% on RA improved with slowing of gait speed and cues for breathing techniques   Stairs             Wheelchair Mobility     Tilt Bed    Modified Rankin (Stroke Patients Only)       Balance Overall balance assessment: Needs assistance Sitting-balance support: No upper extremity supported, Feet supported Sitting balance-Leahy Scale: Good Sitting balance - Comments: patient is able to reach outside base of support and weight shift while sitting on bed side commode for peri-care with no loss of balance   Standing balance support: No upper extremity supported Standing balance-Leahy Scale: Good                              Cognition Arousal: Alert Behavior During Therapy: WFL for tasks assessed/performed Overall Cognitive Status: Within Functional Limits for tasks assessed                                          Exercises      General Comments        Pertinent Vitals/Pain Pain Assessment Pain Assessment: No/denies pain    Home Living  Prior Function            PT Goals (current goals can now be found in the care plan section) Acute Rehab PT Goals Patient Stated Goal: to return home soon with spouse PT Goal Formulation: With patient Time For Goal Achievement: 12/14/22 Progress towards PT goals: Progressing toward goals    Frequency    Min 1X/week      PT Plan      Co-evaluation              AM-PAC PT "6 Clicks" Mobility   Outcome Measure  Help needed turning from your back to your side while in a flat bed without using bedrails?: None Help needed moving from lying on your back to sitting on the side of a  flat bed without using bedrails?: A Little Help needed moving to and from a bed to a chair (including a wheelchair)?: A Little Help needed standing up from a chair using your arms (e.g., wheelchair or bedside chair)?: A Little Help needed to walk in hospital room?: A Little Help needed climbing 3-5 steps with a railing? : A Little 6 Click Score: 19    End of Session   Activity Tolerance: Patient tolerated treatment well Patient left: with call bell/phone within reach;in bed (seated up EOB) Nurse Communication: Mobility status PT Visit Diagnosis: Unsteadiness on feet (R26.81);Other abnormalities of gait and mobility (R26.89)     Time: 9528-4132 PT Time Calculation (min) (ACUTE ONLY): 11 min  Charges:    $Gait Training: 8-22 mins PT General Charges $$ ACUTE PT VISIT: 1 Visit                     Anabela Crayton R. PTA Acute Rehabilitation Services Office: [phone]   Catalina Antigua 12/04/2022, 4:03 PM

## 2022-12-04 NOTE — Progress Notes (Signed)
Mobility Specialist Progress Note:   12/04/22 0930  Mobility  Activity Ambulated with assistance in hallway  Level of Assistance Standby assist, set-up cues, supervision of patient - no hands on  Assistive Device None  Distance Ambulated (ft) 500 ft  Activity Response Tolerated well  Mobility Referral Yes  $Mobility charge 1 Mobility  Mobility Specialist Start Time (ACUTE ONLY) 0930  Mobility Specialist Stop Time (ACUTE ONLY) 0945  Mobility Specialist Time Calculation (min) (ACUTE ONLY) 15 min   Pt eager for mobility session. Required no physical assist throughout. Ambulated on RA with PMV on. SpO2 >90% throughout. Back sitting EOB with all needs met.   Addison Lank Mobility Specialist Please contact via SecureChat or  Rehab office at [phone]

## 2022-12-04 NOTE — Plan of Care (Signed)
  Problem: Education: Goal: Knowledge of the prescribed therapeutic regimen will improve Outcome: Progressing Goal: Understanding of sexual limitations or changes related to disease process or condition will improve Outcome: Progressing Goal: Individualized Educational Video(s) Outcome: Progressing   Problem: Self-Concept: Goal: Communication of feelings regarding changes in body function or appearance will improve Outcome: Progressing   Problem: Skin Integrity: Goal: Demonstration of wound healing without infection will improve Outcome: Progressing   Problem: Activity: Goal: Ability to tolerate increased activity will improve Outcome: Progressing   Problem: Respiratory: Goal: Ability to maintain a clear airway and adequate ventilation will improve Outcome: Progressing   Problem: Role Relationship: Goal: Method of communication will improve Outcome: Progressing   Problem: Safety: Goal: Non-violent Restraint(s) Outcome: Progressing   Problem: Education: Goal: Knowledge of General Education information will improve Description: Including pain rating scale, medication(s)/side effects and non-pharmacologic comfort measures Outcome: Progressing   Problem: Health Behavior/Discharge Planning: Goal: Ability to manage health-related needs will improve Outcome: Progressing   Problem: Clinical Measurements: Goal: Ability to maintain clinical measurements within normal limits will improve Outcome: Progressing Goal: Will remain free from infection Outcome: Progressing Goal: Diagnostic test results will improve Outcome: Progressing Goal: Respiratory complications will improve Outcome: Progressing Goal: Cardiovascular complication will be avoided Outcome: Progressing   Problem: Activity: Goal: Risk for activity intolerance will decrease Outcome: Progressing   Problem: Nutrition: Goal: Adequate nutrition will be maintained Outcome: Progressing   Problem: Coping: Goal:  Level of anxiety will decrease Outcome: Progressing   Problem: Elimination: Goal: Will not experience complications related to bowel motility Outcome: Progressing Goal: Will not experience complications related to urinary retention Outcome: Progressing   Problem: Pain Managment: Goal: General experience of comfort will improve Outcome: Progressing   Problem: Safety: Goal: Ability to remain free from injury will improve Outcome: Progressing   Problem: Skin Integrity: Goal: Risk for impaired skin integrity will decrease Outcome: Progressing   Problem: Education: Goal: Knowledge about tracheostomy care/management will improve Outcome: Progressing   Problem: Activity: Goal: Ability to tolerate increased activity will improve Outcome: Progressing   Problem: Health Behavior/Discharge Planning: Goal: Ability to manage tracheostomy will improve Outcome: Progressing   Problem: Respiratory: Goal: Patent airway maintenance will improve Outcome: Progressing   Problem: Role Relationship: Goal: Ability to communicate will improve Outcome: Progressing

## 2022-12-05 DIAGNOSIS — J9602 Acute respiratory failure with hypercapnia: Secondary | ICD-10-CM | POA: Diagnosis not present

## 2022-12-05 NOTE — Plan of Care (Signed)
  Problem: Education: Goal: Knowledge of the prescribed therapeutic regimen will improve Outcome: Progressing Goal: Understanding of sexual limitations or changes related to disease process or condition will improve Outcome: Progressing Goal: Individualized Educational Video(s) Outcome: Progressing   Problem: Self-Concept: Goal: Communication of feelings regarding changes in body function or appearance will improve Outcome: Progressing   Problem: Skin Integrity: Goal: Demonstration of wound healing without infection will improve Outcome: Progressing   Problem: Activity: Goal: Ability to tolerate increased activity will improve Outcome: Progressing   Problem: Respiratory: Goal: Ability to maintain a clear airway and adequate ventilation will improve Outcome: Progressing   Problem: Role Relationship: Goal: Method of communication will improve Outcome: Progressing   Problem: Safety: Goal: Non-violent Restraint(s) Outcome: Progressing   Problem: Education: Goal: Knowledge of General Education information will improve Description: Including pain rating scale, medication(s)/side effects and non-pharmacologic comfort measures Outcome: Progressing   Problem: Health Behavior/Discharge Planning: Goal: Ability to manage health-related needs will improve Outcome: Progressing   Problem: Clinical Measurements: Goal: Ability to maintain clinical measurements within normal limits will improve Outcome: Progressing Goal: Will remain free from infection Outcome: Progressing Goal: Diagnostic test results will improve Outcome: Progressing Goal: Respiratory complications will improve Outcome: Progressing Goal: Cardiovascular complication will be avoided Outcome: Progressing   Problem: Activity: Goal: Risk for activity intolerance will decrease Outcome: Progressing   Problem: Nutrition: Goal: Adequate nutrition will be maintained Outcome: Progressing   Problem: Coping: Goal:  Level of anxiety will decrease Outcome: Progressing   Problem: Elimination: Goal: Will not experience complications related to bowel motility Outcome: Progressing Goal: Will not experience complications related to urinary retention Outcome: Progressing   Problem: Pain Managment: Goal: General experience of comfort will improve Outcome: Progressing   Problem: Safety: Goal: Ability to remain free from injury will improve Outcome: Progressing   Problem: Skin Integrity: Goal: Risk for impaired skin integrity will decrease Outcome: Progressing   Problem: Education: Goal: Knowledge about tracheostomy care/management will improve Outcome: Progressing   Problem: Activity: Goal: Ability to tolerate increased activity will improve Outcome: Progressing   Problem: Health Behavior/Discharge Planning: Goal: Ability to manage tracheostomy will improve Outcome: Progressing   Problem: Respiratory: Goal: Patent airway maintenance will improve Outcome: Progressing   Problem: Role Relationship: Goal: Ability to communicate will improve Outcome: Progressing

## 2022-12-05 NOTE — Progress Notes (Signed)
PROGRESS NOTE    Meghan Contreras  GEX:[id number] DOB: 1965-10-25 DOA: 11/27/2022 PCP: Julieanne Manson, MD   Brief Narrative:  57 y.o. 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  She was found to have right lower lobe pneumonia and COPD exacerbation and started on antibiotics and steroids.  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  ENT was consulted who performed direct laryngoscopy and tracheostomy on 11/28/2022.  She was transferred to [facility] service from 11/30/2022 onwards.  Assessment & Plan:   Acute respiratory failure with hypoxia and hypercapnia Secondary to pneumonia, COPD exacerbation complicated by upper airway obstruction.  Patient required intubation on 9/28 with eventual extubation on 9/29 after tracheostomy was placed.  Patient now on tracheostomy collar with 6L/min supplemental oxygen.  Wean off as able.   Upper airway obstruction status post tracheostomy Left glottic mass Vocal cord paralysis Patient underwent direct laryngoscopy and tracheostomy by ENT on 9/29.  Biopsy of left glottic mass was obtained: Surgical pathology was benign -Trach care as per ENT.  Tube exchange done on 12/03/2022 by ENT.  Will need outpatient follow-up with trach clinic -Diet as per SLP.  Right lower lobe pneumonia Patient was initially started empirically on Ceftriaxone/azithromycin with transition to Levaquin, secondary to B-lactam allergy, for treatment while in the ICU. -Has completed a course of Levaquin.    COPD exacerbation Patient managed on Solu-Medrol, Pulmicort, Brovana, Yupelri, addition to as needed albuterol. COPD exacerbation improved. -Continue Pulmicort, Roxy Manns -Pulmonology recommendation to de-escalate steroids  quickly.  Patient has been transitioned to oral prednisone.  Finish 5 to 7-day course of oral prednisone.  Outpatient follow-up with pulmonary.  Hyperglycemia -Possibly related to steroids.  -A1c 5.8.  Outpatient follow-up.  Hypothyroidism -Continue Synthroid   Tobacco use -Pulmonology recommendations for lung cancer screening referral at discharge.   Chronic pain/chronic opiate dependence -Patient is managed on methadone as an outpatient  -Continue methadone.  Constipation -Continue bowel regimen  Obesity -Outpatient follow-up  Physical deconditioning -PT recommended no PT follow-up.  Hypokalemia -Resolved  DVT prophylaxis: Lovenox Code Status: Full Family Communication: None at bedside Disposition Plan: Status is: Inpatient Remains inpatient appropriate because: Of severity of illness.  Possible discharge home tomorrow if tracheostomy care gets set up at home  Consultants: PCCM/ENT  Procedures: As above  Antimicrobials:  Anti-infectives (From admission, onward)    Start     Dose/Rate Route Frequency Ordered Stop   11/28/22 0300  levofloxacin (LEVAQUIN) IVPB 750 mg  Status:  Discontinued        750 mg 100 mL/hr over 90 Minutes Intravenous Every 24 hours 11/27/22 1107 12/03/22 0803   11/27/22 0300  levofloxacin (LEVAQUIN) IVPB 500 mg  Status:  Discontinued        500 mg 100 mL/hr over 60 Minutes Intravenous Every 24 hours 11/27/22 0227 11/27/22 1107   11/27/22 0100  cefTRIAXone (ROCEPHIN) 2 g in sodium chloride 0.9 % 100 mL IVPB        2 g 200 mL/hr over 30 Minutes Intravenous  Once 11/27/22 0045 11/27/22 0141   11/27/22 0100  azithromycin (ZITHROMAX) 500 mg in sodium chloride 0.9 % 250 mL IVPB        500 mg 250 mL/hr over 60 Minutes Intravenous  Once 11/27/22 0045 11/27/22 0250  Subjective: Patient seen and examined at bedside.  No fever, vomiting, abdominal pain reported.   Objective: Vitals:   12/04/22 1915 12/04/22 1948 12/04/22 2320 12/05/22  0731  BP: 117/79  127/82   Pulse: 73  65   Resp: 19  17   Temp: 98.8 F (37.1 C)  (!) 97.5 F (36.4 C)   TempSrc: Oral  Axillary   SpO2: 97% 98% 99% 99%  Weight:      Height:        Intake/Output Summary (Last 24 hours) at 12/05/2022 0806 Last data filed at 12/05/2022 0600 Gross per 24 hour  Intake 710 ml  Output --  Net 710 ml    Filed Weights   12/02/22 0440 12/03/22 0110 12/04/22 0541  Weight: 83.3 kg 80.1 kg 81.1 kg    Examination:  General: No acute distress.  On 6 L oxygen via tracheostomy.  Chronically ill and deconditioned looking. ENT/neck: Janina Mayo present.  No JVD elevation noted  respiratory: Bilateral decreased breath sounds at bases with scattered crackles CVS: S1 and S2 heard; rate currently controlled  abdominal: Soft, nontender, distended mildly; no organomegaly, bowel sounds normally heard  extremities: No clubbing; mild lower extremity edema present  CNS: Alert and oriented.  No focal deficits noted lymph: No obvious lymphadenopathy skin: No obvious petechia/lesions psych: Mostly flat affect.  Showing milligrams of agitation Musculoskeletal: No obvious joint tenderness/erythema  Data Reviewed: I have personally reviewed following labs and imaging studies  CBC: Recent Labs  Lab 11/29/22 0342 12/01/22 0252  WBC 10.5 8.3  HGB 13.5 13.0  HCT 42.9 40.9  MCV 78.1* 76.9*  PLT 247 217   Basic Metabolic Panel: Recent Labs  Lab 11/29/22 0342 12/01/22 0252 12/02/22 0256  NA 139 140 138  K 3.6 3.3* 3.7  CL 101 104 102  CO2 27 28 26   GLUCOSE 80 110* 102*  BUN 21* 21* 22*  CREATININE 0.92 1.01* 1.04*  CALCIUM 8.6* 8.7* 8.4*  MG 2.1  --  2.1  PHOS 3.7  --   --    GFR: Estimated Creatinine Clearance: 58.3 mL/min (A) (by C-G formula based on SCr of 1.04 mg/dL (H)). Liver Function Tests: No results for input(s): "AST", "ALT", "ALKPHOS", "BILITOT", "PROT", "ALBUMIN" in the last 168 hours.  No results for input(s): "LIPASE", "AMYLASE" in the last  168 hours. No results for input(s): "AMMONIA" in the last 168 hours. Coagulation Profile: No results for input(s): "INR", "PROTIME" in the last 168 hours.  Cardiac Enzymes: No results for input(s): "CKTOTAL", "CKMB", "CKMBINDEX", "TROPONINI" in the last 168 hours. BNP (last 3 results) No results for input(s): "PROBNP" in the last 8760 hours. HbA1C: No results for input(s): "HGBA1C" in the last 72 hours.  CBG: Recent Labs  Lab 11/29/22 0000 11/29/22 0342 11/29/22 0852 11/29/22 1235 11/29/22 1630  GLUCAP 94 76 73 94 108*   Lipid Profile: No results for input(s): "CHOL", "HDL", "LDLCALC", "TRIG", "CHOLHDL", "LDLDIRECT" in the last 72 hours. Thyroid Function Tests: No results for input(s): "TSH", "T4TOTAL", "FREET4", "T3FREE", "THYROIDAB" in the last 72 hours. Anemia Panel: No results for input(s): "VITAMINB12", "FOLATE", "FERRITIN", "TIBC", "IRON", "RETICCTPCT" in the last 72 hours. Sepsis Labs: No results for input(s): "PROCALCITON", "LATICACIDVEN" in the last 168 hours.   Recent Results (from the past 240 hour(s))  Blood Culture (routine x 2)     Status: None   Collection Time: 11/27/22 12:46 AM   Specimen: BLOOD RIGHT HAND  Result Value Ref Range Status   Specimen Description BLOOD  RIGHT HAND  Final   Special Requests   Final    BOTTLES DRAWN AEROBIC AND ANAEROBIC Blood Culture adequate volume   Culture   Final    NO GROWTH 5 DAYS Performed at [facility], [address], [place], [place] [address]    Report Status 12/02/2022 FINAL  Final  Resp panel by RT-PCR (RSV, Flu A&B, Covid) Anterior Nasal Swab     Status: None   Collection Time: 11/27/22  1:00 AM   Specimen: Anterior Nasal Swab  Result Value Ref Range Status   SARS Coronavirus 2 by RT PCR NEGATIVE NEGATIVE Final   Influenza A by PCR NEGATIVE NEGATIVE Final   Influenza B by PCR NEGATIVE NEGATIVE Final    Comment: (NOTE) The Xpert Xpress SARS-CoV-2/FLU/RSV plus assay is intended as an aid in the  diagnosis of influenza from Nasopharyngeal swab specimens and should not be used as a sole basis for treatment. Nasal washings and aspirates are unacceptable for Xpert Xpress SARS-CoV-2/FLU/RSV testing.  Fact Sheet for Patients: BloggerCourse.com  Fact Sheet for Healthcare Providers: SeriousBroker.it  This test is not yet approved or cleared by the [place] FDA and has been authorized for detection and/or diagnosis of SARS-CoV-2 by FDA under an Emergency Use Authorization (EUA). This EUA will remain in effect (meaning this test can be used) for the duration of the COVID-19 declaration under Section 564(b)(1) of the Act, 21 U.S.C. section 360bbb-3(b)(1), unless the authorization is terminated or revoked.     Resp Syncytial Virus by PCR NEGATIVE NEGATIVE Final    Comment: (NOTE) Fact Sheet for Patients: BloggerCourse.com  Fact Sheet for Healthcare Providers: SeriousBroker.it  This test is not yet approved or cleared by the [place] FDA and has been authorized for detection and/or diagnosis of SARS-CoV-2 by FDA under an Emergency Use Authorization (EUA). This EUA will remain in effect (meaning this test can be used) for the duration of the COVID-19 declaration under Section 564(b)(1) of the Act, 21 U.S.C. section 360bbb-3(b)(1), unless the authorization is terminated or revoked.  Performed at [facility], [address] [place], [place] [address]   Blood Culture (routine x 2)     Status: None   Collection Time: 11/27/22  1:04 AM   Specimen: BLOOD LEFT HAND  Result Value Ref Range Status   Specimen Description BLOOD LEFT HAND  Final   Special Requests   Final    BOTTLES DRAWN AEROBIC AND ANAEROBIC Blood Culture adequate volume   Culture   Final    NO GROWTH 5 DAYS Performed at [facility], [address] [place], [place] [address]    Report Status  12/02/2022 FINAL  Final  MRSA Next Gen by PCR, Nasal     Status: None   Collection Time: 11/27/22  3:04 AM   Specimen: Nasal Mucosa; Nasal Swab  Result Value Ref Range Status   MRSA by PCR Next Gen NOT DETECTED NOT DETECTED Final    Comment: (NOTE) The GeneXpert MRSA Assay (FDA approved for NASAL specimens only), is one component of a comprehensive MRSA colonization surveillance program. It is not intended to diagnose MRSA infection nor to guide or monitor treatment for MRSA infections. Test performance is not FDA approved in patients less than 41 years old. Performed at [facility], [address] [place], [place] [address]          Radiology Studies: No results found.      Scheduled Meds:  arformoterol  15 mcg Nebulization BID   budesonide (PULMICORT) nebulizer solution  0.5 mg Nebulization BID   Chlorhexidine Gluconate Cloth  6 each Topical Daily   enoxaparin (LOVENOX) injection  40 mg Subcutaneous Q24H   feeding supplement  237 mL Oral Q24H   furosemide  20 mg Intravenous Once   levothyroxine  150 mcg Oral QAC breakfast   methadone  55 mg Oral Daily   multivitamin with minerals  1 tablet Oral Daily   nicotine  21 mg Transdermal Daily   mouth rinse  15 mL Mouth Rinse 4 times per day   predniSONE  40 mg Oral Q breakfast   QUEtiapine  150 mg Oral Daily   And   QUEtiapine  300 mg Oral QHS   revefenacin  175 mcg Nebulization Daily   senna-docusate  1 tablet Oral BID   Continuous Infusions:  sodium chloride Stopped (11/30/22 0913)          Glade Lloyd, MD [facility] 12/05/2022, 8:06 AM

## 2022-12-05 NOTE — Progress Notes (Signed)
Mobility Specialist Progress Note:   12/05/22 0905  Mobility  Activity Ambulated with assistance in hallway  Level of Assistance Standby assist, set-up cues, supervision of patient - no hands on  Assistive Device None  Distance Ambulated (ft) 500 ft  Activity Response Tolerated well  Mobility Referral Yes  $Mobility charge 1 Mobility  Mobility Specialist Start Time (ACUTE ONLY) 0905  Mobility Specialist Stop Time (ACUTE ONLY) 0915  Mobility Specialist Time Calculation (min) (ACUTE ONLY) 10 min   During Mobility: SpO2 90-96% RA  Pt eager for mobility session. Required no physical assistance throughout, only supervision for safety/watch SpO2. VSS on RA. No c/o throughout. Pt back in bed with all needs met.   Addison Lank Mobility Specialist Please contact via SecureChat or  Rehab office at [phone]

## 2022-12-06 ENCOUNTER — Telehealth: Payer: Self-pay | Admitting: Critical Care Medicine

## 2022-12-06 DIAGNOSIS — Z930 Tracheostomy status: Secondary | ICD-10-CM

## 2022-12-06 DIAGNOSIS — J9602 Acute respiratory failure with hypercapnia: Secondary | ICD-10-CM | POA: Diagnosis not present

## 2022-12-06 DIAGNOSIS — J9601 Acute respiratory failure with hypoxia: Secondary | ICD-10-CM | POA: Diagnosis not present

## 2022-12-06 NOTE — Progress Notes (Signed)
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  Called EMS with acute onset of shortness of breath Initial history not available apparently received bronchodilator en route, had progressive decline in spite of bronchodilator to the point where she was requiring manual mask ventilation she was intubated shortly after arrival to the emergency room ER evaluation: White blood cell count 7.2 hemoglobin 12.6 INR 1.1 BUN/creatinine unremarkable BNP 525, lactate 0.6 venous blood gas pH 7.17 pCO2 of 98, \HCO3 35.  CT of chest showed bilateral lower lobe consolidation without pulmonary emboli, CT head unremarkable Interventions in the ER included: In addition to mechanical ventilation she was started on empiric antibiotics, IV hydration, and scheduled bronchodilators Pulmonary asked to admit.  Pertinent  Medical History  Vocal cord paralysis felt secondary to prior thyroid surgery Possible COPD Tobacco abuse Peripheral edema Hypothyroidism Anemia hyperlipidemia hyperglycemia hearing deficits  Significant Hospital Events: Including procedures, antibiotic start and stop dates in addition to other pertinent events   9/28 admitted with acute hypoxic and hypercarbic respiratory failure secondary to pneumonia intubated and ER.  Started on empiric antibiotics.  9/29 extubated. Post-extubation stridor worse than baseline upper airway noises. ENT consulted for worsening dysphonia and stridor. Bedside flex lary showed right vocal cord paralysis and left vocal cord mass. Went to OR mass BX'd trach placed.  9/30 SLP evaluating. Move out of ICU. Remove foley   Interim History /  Subjective:  She denies complaints and feels like her breathing is controlled. Trach change on 10/4, using PMV.   Objective   Blood pressure (!) 118/91, pulse 65, temperature 98 F (36.7 C), temperature source Oral, resp. rate 19, height 5' 0.98" (1.549 m), weight 85 kg, SpO2 98%.    FiO2 (%):  [28 %] 28 %   Intake/Output Summary (Last 24 hours) at 12/06/2022 0839 Last data filed at 12/06/2022 0700 Gross per 24 hour  Intake 600 ml  Output --  Net 600 ml   Filed Weights   12/03/22 0110 12/04/22 0541 12/06/22 0406  Weight: 80.1 kg 81.1 kg 85 kg    Examination: Chronically ill appearing woman lying in bed sleeping, arouses easily to stimulation Able to use PMV on her own, voice is strong Breathing comfortably on TC, no wheezing S1S2, RRR No significant LE edema, no cyanosis Skin warm, dry, no rashes.   Path: benign mucosa with chroninc inflammation, necrosis, fibrosis  Resolved Hospital Problem list     Assessment & Plan:  Acute hypoxic and hypercarbic respiratory failure secondary to pneumonia and acute COPD exacerbation complicated by upper airway obstruction from left VC polyp and chronic R vocal cord paralysis (adducted), now s/p Tracheostomy 9/29 -need to determine if she needs to go home on oxygen-- d/w RT. Needs to be walked. If she does require O2, need to determine if she can meet her needs with [place] or if she would require trach collar attachments for her oxygen.  -recommend discharging on nebs rather than dry powder inhalers-- recommend pulmicort, Yupelri, and Brovana if this is an option. She would need a trach collar attachment for her nebulizer rather than a face mask to ensure the meds can get where they need to go. If nebs are prohibitively expensive, would  be better to try Trelegy again rather than no bronchodilators.  -needs home trach supplies -needs to quit smoking -can follow up with ENT (already scheduled 10/24) & trach clinic (requested) -From a pulmonary  standpoint she is stable to go home once this issues are addressed. We will continue to follow periodically while she is admitted. Please call with questions in the interim.    Best Practice (right click and "Reselect all SmartList Selections" daily)   Pr primary   Critical care time:     Steffanie Dunn, DO 12/06/22 8:39 AM [facility]  For contact information, see Amion. If no response to pager, please call PCCM consult pager. After hours, 7PM- 7AM, please call Elink.

## 2022-12-06 NOTE — TOC Progression Note (Addendum)
Transition of Care [facility]) - Progression Note    Patient Details  Name: Meghan Contreras MRN: [id number] Date of Birth: [birth year]  [facility] CM/SW Contact  Leone Haven, RN Phone Number: 12/06/2022, 10:37 AM  Clinical Narrative:    NCM made referral to [facility] with [facility] for [facility] , she is able to take referral.  Soc will begin 24 to 48 hrs post dc. [employer] will deliver trach supplies to room.  Plan for  dc tomorrow, also staff RN Elease Hashimoto will supply patient with a ambu bag, , trachsize 4 and 3 and couple of trach kits.  NCM received call from [facility] with [facility], she states they are still trying to verify this patient's insurance, she will get back with me to make sure they can take referral. They may not be able to .  Eber Jones says they told her insurance is not in network with them.   NCM contacted Lorrine Kin with [facility] at [phone] , faxed her the demo face sheet to [phone].  She is trying to  also verify insurance.   Expected Discharge Plan: Home w Home Health Services Barriers to Discharge: Continued Medical Work up  Expected Discharge Plan and Services In-house Referral: NA Discharge Planning Services: CM Consult Post Acute Care Choice: Home Health Living arrangements for the past 2 months: Single Family Home                 DME Arranged: Trach supplies DME Agency: [employer] Date DME Agency Contacted: 11/30/22 Time DME Agency Contacted: [phone] Representative spoke with at DME Agency: Marthann Schiller HH Arranged: RN HH Agency: [facility] Date [facility] Agency Contacted: 11/30/22 Time HH Agency Contacted: 1527 Representative spoke with at [facility] Agency: Amy   Social Determinants of Health (SDOH) Interventions SDOH Screenings   Food Insecurity: Patient Unable To Answer (11/27/2022)  Housing: Patient Unable To Answer (02/11/2022)  Transportation Needs: No Transportation Needs (02/11/2022)  Utilities: Not At Risk  (02/11/2022)  Depression (PHQ2-9): Low Risk  (12/30/2021)  Financial Resource Strain: Medium Risk (02/11/2022)  Tobacco Use: High Risk (11/28/2022)    Readmission Risk Interventions    11/29/2022    1:20 PM  Readmission Risk Prevention Plan  Transportation Screening Complete  HRI or Home Care Consult Complete  Social Work Consult for Recovery Care Planning/Counseling Complete  Palliative Care Screening Not Applicable  Medication Review Oceanographer) Referral to Pharmacy

## 2022-12-06 NOTE — Progress Notes (Signed)
Patient ambulated approximately 439ft on room air maintaining O2 sats 92-94%. No c/o SOB, able to maintain conversation while walking. Dr. Chestine Spore made aware.

## 2022-12-06 NOTE — Progress Notes (Signed)
PROGRESS NOTE    Meghan Contreras  ZOX:[id number] DOB: [birth year] DOA: 11/27/2022 PCP: Julieanne Manson, MD   Brief Narrative:  57 y.o. 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  She was found to have right lower lobe pneumonia and COPD exacerbation and started on antibiotics and steroids.  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  ENT was consulted who performed direct laryngoscopy and tracheostomy on 11/28/2022.  She was transferred to [facility] service from 11/30/2022 onwards.  Assessment & Plan:   Acute respiratory failure with hypoxia and hypercapnia Secondary to pneumonia, COPD exacerbation complicated by upper airway obstruction.  Patient required intubation on 9/28 with eventual extubation on 9/29 after tracheostomy was placed.  Patient now on tracheostomy collar with 6L/min supplemental oxygen.  Wean off as able.   Upper airway obstruction status post tracheostomy Left glottic mass Vocal cord paralysis Patient underwent direct laryngoscopy and tracheostomy by ENT on 9/29.  Biopsy of left glottic mass was obtained: Surgical pathology was benign -Trach care as per ENT.  Tube exchange done on 12/03/2022 by ENT.  Will need outpatient follow-up with trach clinic -Diet as per SLP.  Right lower lobe pneumonia Patient was initially started empirically on Ceftriaxone/azithromycin with transition to Levaquin, secondary to B-lactam allergy, for treatment while in the ICU. -Has completed a course of Levaquin.    COPD exacerbation Patient managed on Solu-Medrol, Pulmicort, Brovana, Yupelri, addition to as needed albuterol. COPD exacerbation improved. -Continue Pulmicort, Roxy Manns -Pulmonology recommendation to de-escalate steroids  quickly.  Patient has been transitioned to oral prednisone.  Finish 5 to 7-day course of oral prednisone.  Outpatient follow-up with pulmonary.  Hyperglycemia -Possibly related to steroids.  -A1c 5.8.  Outpatient follow-up.  Hypothyroidism -Continue Synthroid   Tobacco use -Pulmonology recommendations for lung cancer screening referral at discharge.   Chronic pain/chronic opiate dependence -Patient is managed on methadone as an outpatient  -Continue methadone.  Constipation -Continue bowel regimen  Obesity -Outpatient follow-up  Physical deconditioning -PT recommended no PT follow-up.  Hypokalemia -Resolved  DVT prophylaxis: Lovenox Code Status: Full Family Communication: None at bedside Disposition Plan: Status is: Inpatient Remains inpatient appropriate because: Of severity of illness.  Possible discharge home once tracheostomy care gets set up at home  Consultants: PCCM/ENT  Procedures: As above  Antimicrobials:  Anti-infectives (From admission, onward)    Start     Dose/Rate Route Frequency Ordered Stop   11/28/22 0300  levofloxacin (LEVAQUIN) IVPB 750 mg  Status:  Discontinued        750 mg 100 mL/hr over 90 Minutes Intravenous Every 24 hours 11/27/22 1107 12/03/22 0803   11/27/22 0300  levofloxacin (LEVAQUIN) IVPB 500 mg  Status:  Discontinued        500 mg 100 mL/hr over 60 Minutes Intravenous Every 24 hours 11/27/22 0227 11/27/22 1107   11/27/22 0100  cefTRIAXone (ROCEPHIN) 2 g in sodium chloride 0.9 % 100 mL IVPB        2 g 200 mL/hr over 30 Minutes Intravenous  Once 11/27/22 0045 11/27/22 0141   11/27/22 0100  azithromycin (ZITHROMAX) 500 mg in sodium chloride 0.9 % 250 mL IVPB        500 mg 250 mL/hr over 60 Minutes Intravenous  Once 11/27/22 0045 11/27/22 0250  Subjective: Patient seen and examined at bedside.  Denies worsening shortness breath, fever, vomiting or abdominal pain.    Objective: Vitals:   12/06/22 0046 12/06/22 0406  12/06/22 0745 12/06/22 0805  BP: (!) 141/96 105/74 (!) 118/91   Pulse: 66 65 68 65  Resp: 16 18 18 19   Temp: 98 F (36.7 C) 98.2 F (36.8 C) 98 F (36.7 C)   TempSrc: Oral Oral Oral   SpO2: 99% 95% 95% 98%  Weight:  85 kg    Height:        Intake/Output Summary (Last 24 hours) at 12/06/2022 1100 Last data filed at 12/06/2022 0700 Gross per 24 hour  Intake 600 ml  Output --  Net 600 ml    Filed Weights   12/03/22 0110 12/04/22 0541 12/06/22 0406  Weight: 80.1 kg 81.1 kg 85 kg    Examination:  General: On 6 L oxygen via trach.  No distress.  Chronically ill and deconditioned looking. ENT/neck: Janina Mayo present.  No obvious JVD elevation noted respiratory: Decreased breath sounds at bases bilaterally with some crackles  CVS: Rate mostly controlled; S1 and S2 are heard abdominal: Soft, nontender, still slightly distended; no organomegaly, normal bowel sounds are heard  extremities: Trace lower extremity edema present; no cyanosis  CNS: No obvious focal deficits noted.  Awake and alert.   Lymph: No palpable lymphadenopathy noted  skin: No obvious rashes/ecchymosis  psych: Not agitated.  Flat affect mostly.   Musculoskeletal: No obvious joint swelling/deformity  Data Reviewed: I have personally reviewed following labs and imaging studies  CBC: Recent Labs  Lab 12/01/22 0252  WBC 8.3  HGB 13.0  HCT 40.9  MCV 76.9*  PLT 217   Basic Metabolic Panel: Recent Labs  Lab 12/01/22 0252 12/02/22 0256  NA 140 138  K 3.3* 3.7  CL 104 102  CO2 28 26  GLUCOSE 110* 102*  BUN 21* 22*  CREATININE 1.01* 1.04*  CALCIUM 8.7* 8.4*  MG  --  2.1   GFR: Estimated Creatinine Clearance: 59.8 mL/min (A) (by C-G formula based on SCr of 1.04 mg/dL (H)). Liver Function Tests: No results for input(s): "AST", "ALT", "ALKPHOS", "BILITOT", "PROT", "ALBUMIN" in the last 168 hours.  No results for input(s): "LIPASE", "AMYLASE" in the last 168 hours. No results for input(s): "AMMONIA" in  the last 168 hours. Coagulation Profile: No results for input(s): "INR", "PROTIME" in the last 168 hours.  Cardiac Enzymes: No results for input(s): "CKTOTAL", "CKMB", "CKMBINDEX", "TROPONINI" in the last 168 hours. BNP (last 3 results) No results for input(s): "PROBNP" in the last 8760 hours. HbA1C: No results for input(s): "HGBA1C" in the last 72 hours.  CBG: Recent Labs  Lab 11/29/22 1235 11/29/22 1630  GLUCAP 94 108*   Lipid Profile: No results for input(s): "CHOL", "HDL", "LDLCALC", "TRIG", "CHOLHDL", "LDLDIRECT" in the last 72 hours. Thyroid Function Tests: No results for input(s): "TSH", "T4TOTAL", "FREET4", "T3FREE", "THYROIDAB" in the last 72 hours. Anemia Panel: No results for input(s): "VITAMINB12", "FOLATE", "FERRITIN", "TIBC", "IRON", "RETICCTPCT" in the last 72 hours. Sepsis Labs: No results for input(s): "PROCALCITON", "LATICACIDVEN" in the last 168 hours.   Recent Results (from the past 240 hour(s))  Blood Culture (routine x 2)     Status: None   Collection Time: 11/27/22 12:46 AM   Specimen: BLOOD RIGHT HAND  Result Value Ref Range Status   Specimen Description BLOOD RIGHT HAND  Final   Special Requests   Final    BOTTLES DRAWN AEROBIC AND ANAEROBIC  Blood Culture adequate volume   Culture   Final    NO GROWTH 5 DAYS Performed at [facility], [address] [place], [place] [address]    Report Status 12/02/2022 FINAL  Final  Resp panel by RT-PCR (RSV, Flu A&B, Covid) Anterior Nasal Swab     Status: None   Collection Time: 11/27/22  1:00 AM   Specimen: Anterior Nasal Swab  Result Value Ref Range Status   SARS Coronavirus 2 by RT PCR NEGATIVE NEGATIVE Final   Influenza A by PCR NEGATIVE NEGATIVE Final   Influenza B by PCR NEGATIVE NEGATIVE Final    Comment: (NOTE) The Xpert Xpress SARS-CoV-2/FLU/RSV plus assay is intended as an aid in the diagnosis of influenza from Nasopharyngeal swab specimens and should not be used as a sole basis for  treatment. Nasal washings and aspirates are unacceptable for Xpert Xpress SARS-CoV-2/FLU/RSV testing.  Fact Sheet for Patients: BloggerCourse.com  Fact Sheet for Healthcare Providers: SeriousBroker.it  This test is not yet approved or cleared by the [place] FDA and has been authorized for detection and/or diagnosis of SARS-CoV-2 by FDA under an Emergency Use Authorization (EUA). This EUA will remain in effect (meaning this test can be used) for the duration of the COVID-19 declaration under Section 564(b)(1) of the Act, 21 U.S.C. section 360bbb-3(b)(1), unless the authorization is terminated or revoked.     Resp Syncytial Virus by PCR NEGATIVE NEGATIVE Final    Comment: (NOTE) Fact Sheet for Patients: BloggerCourse.com  Fact Sheet for Healthcare Providers: SeriousBroker.it  This test is not yet approved or cleared by the [place] FDA and has been authorized for detection and/or diagnosis of SARS-CoV-2 by FDA under an Emergency Use Authorization (EUA). This EUA will remain in effect (meaning this test can be used) for the duration of the COVID-19 declaration under Section 564(b)(1) of the Act, 21 U.S.C. section 360bbb-3(b)(1), unless the authorization is terminated or revoked.  Performed at [facility], [address], [place], [place] [address]   Blood Culture (routine x 2)     Status: None   Collection Time: 11/27/22  1:04 AM   Specimen: BLOOD LEFT HAND  Result Value Ref Range Status   Specimen Description BLOOD LEFT HAND  Final   Special Requests   Final    BOTTLES DRAWN AEROBIC AND ANAEROBIC Blood Culture adequate volume   Culture   Final    NO GROWTH 5 DAYS Performed at [facility], [address] [place], [place] [address]    Report Status 12/02/2022 FINAL  Final  MRSA Next Gen by PCR, Nasal     Status: None   Collection Time: 11/27/22   3:04 AM   Specimen: Nasal Mucosa; Nasal Swab  Result Value Ref Range Status   MRSA by PCR Next Gen NOT DETECTED NOT DETECTED Final    Comment: (NOTE) The GeneXpert MRSA Assay (FDA approved for NASAL specimens only), is one component of a comprehensive MRSA colonization surveillance program. It is not intended to diagnose MRSA infection nor to guide or monitor treatment for MRSA infections. Test performance is not FDA approved in patients less than 27 years old. Performed at [facility], [address] [place], [place] [address]          Radiology Studies: No results found.      Scheduled Meds:  arformoterol  15 mcg Nebulization BID   budesonide (PULMICORT) nebulizer solution  0.5 mg Nebulization BID   enoxaparin (LOVENOX)  injection  40 mg Subcutaneous Q24H   feeding supplement  237 mL Oral Q24H   levothyroxine  150 mcg Oral QAC breakfast   methadone  55 mg Oral Daily   multivitamin with minerals  1 tablet Oral Daily   nicotine  21 mg Transdermal Daily   mouth rinse  15 mL Mouth Rinse 4 times per day   predniSONE  40 mg Oral Q breakfast   QUEtiapine  150 mg Oral Daily   And   QUEtiapine  300 mg Oral QHS   revefenacin  175 mcg Nebulization Daily   senna-docusate  1 tablet Oral BID   Continuous Infusions:  sodium chloride Stopped (11/30/22 0913)          Glade Lloyd, MD [facility] 12/06/2022, 11:00 AM

## 2022-12-06 NOTE — Telephone Encounter (Signed)
Trach clinic appointment requested in about 2 weeks  Has ENT follow up at [facility] on 12/23/22.    Steffanie Dunn, DO 12/06/22 9:09 AM [facility]  For contact information, see Amion. If no response to pager, please call PCCM consult pager. After hours, 7PM- 7AM, please call Elink.

## 2022-12-06 NOTE — Progress Notes (Signed)
RT note. Patient given education on trach care, changing inner cannula and cleaning/ putting new guaze on. Questions answered from patient, Patient request for husband to also get education. Will plan for tomorrow morning. Patient placed on 21 % ATC due to sat 94% . RT will continue to monitor.    12/06/22 1400  Therapy Vitals  Pulse Rate 64  Resp 20  MEWS Score/Color  MEWS Score 0  MEWS Score Color Green  Respiratory Assessment  Assessment Type Assess only  Respiratory Pattern Regular;Unlabored  Chest Assessment Chest expansion symmetrical  Sputum Specimen Source Spontaneous cough  Bilateral Breath Sounds Diminished  Oxygen Therapy/Pulse Ox  O2 Device Tracheostomy Collar  O2 Therapy (S)  Room air humidified  O2 Flow Rate (L/min) 6 L/min  FiO2 (%) 21 %  SpO2 94 %  Tracheostomy Shiley Flexible 4 mm Uncuffed  Placement Date/Time: 12/03/22 0903   Placed By: (c)   Brand: Shiley Flexible  Size (mm): 4 mm  Style: Uncuffed  Status Passy Muir Speaking valve;Secured with trach ties  Site Assessment Clean;Dry  Site Care (S)  Dressing applied  Inner Cannula Care (S)  Changed/new  Ties Assessment Clean, Dry  Tracheostomy Equipment at bedside Yes and checklist posted at head of bed  Respiratory  Airway LDA Tracheostomy

## 2022-12-07 ENCOUNTER — Other Ambulatory Visit (HOSPITAL_COMMUNITY): Payer: Self-pay

## 2022-12-07 DIAGNOSIS — J9602 Acute respiratory failure with hypercapnia: Secondary | ICD-10-CM | POA: Diagnosis not present

## 2022-12-07 MED ORDER — ARFORMOTEROL TARTRATE 15 MCG/2ML IN NEBU: 15.0000 ug | INHALATION_SOLUTION | Freq: Two times a day (BID) | RESPIRATORY_TRACT | 0 refills | Status: DC

## 2022-12-07 MED ORDER — IPRATROPIUM-ALBUTEROL 0.5-2.5 (3) MG/3ML IN SOLN: 3.0000 mL | Freq: Four times a day (QID) | RESPIRATORY_TRACT | 0 refills | Status: DC | PRN

## 2022-12-07 MED ORDER — POLYETHYLENE GLYCOL 3350 17 G PO PACK: 17.0000 g | PACK | Freq: Every day | ORAL | 0 refills | Status: DC | PRN

## 2022-12-07 MED ORDER — POLYETHYLENE GLYCOL 3350 17 G PO PACK: 17.0000 g | PACK | Freq: Every day | ORAL | 0 refills | Status: AC | PRN

## 2022-12-07 MED ORDER — BUDESONIDE 0.5 MG/2ML IN SUSP: 0.5000 mg | Freq: Two times a day (BID) | RESPIRATORY_TRACT | 0 refills | Status: DC

## 2022-12-07 MED ORDER — REVEFENACIN 175 MCG/3ML IN SOLN: 175.0000 ug | Freq: Every day | RESPIRATORY_TRACT | 0 refills | Status: DC

## 2022-12-07 MED ORDER — SENNOSIDES-DOCUSATE SODIUM 8.6-50 MG PO TABS: 1.0000 | ORAL_TABLET | Freq: Two times a day (BID) | ORAL | 0 refills | Status: DC

## 2022-12-07 NOTE — TOC Benefit Eligibility Note (Addendum)
Patient Product/process development scientist completed.    The patient is insured through [employer]. Patient has [employer], may use a copay card, and/or apply for patient assistance if available.    Ran test claim for arformoterol (Brovana) 15 mcg/2 ml and the current 30 day co-pay is $425.80 due to a $4500 deductible.  Ran test claim for ipratropium 0.02% nebulizer and the current 30 day co-pay is $10.94.  Ran test claim for ipratropium-albuterol 0.5-2.5 nebulizer and the current 30 day co-pay is $6.91.  Ran test claim for Yupelri nebulizer and Not on Formulary    This test claim was processed through [employer]- copay amounts may vary at other pharmacies due to [employer], or as the patient moves through the different stages of their insurance plan.     Roland Earl, CPHT Pharmacy Technician III Certified Patient Advocate [facility] Patient Advocate Team Direct Number: [phone]  Fax: [phone]

## 2022-12-07 NOTE — TOC Progression Note (Addendum)
Transition of Care [facility]) - Progression Note    Patient Details  Name: Meghan Contreras MRN: [id number] Date of Birth: [birth year]  Transition of [facility]) CM/SW Contact  Lawerance Sabal, RN Phone Number: 12/07/2022, 2:05 PM  Clinical Narrative:     Notified [employer] this morning that the spouse will be home around 4pm and delivery of DME will need to be after that time, they stated that they have been in contact with him and are aware and that delivery is scheduled for later that time today.  Spoke w Kenney Houseman from [place] who states that patient has Home Health benefits, however it takes them 2 weeks to get auth so [facility] won't be for at least two weeks through them.  I have made referral to [facility] earlier today and awaiting notification of potential start date.  Per bedside nurse in progression rounds this morning the patient is fairly independent with trach care.  [facility] liaison is checking with office re SOC/ approval  Patient approved with [employer] for [facility] RN, Amy will follow up with her    Expected Discharge Plan: Home w Home Health Services Barriers to Discharge: Continued Medical Work up  Expected Discharge Plan and Services In-house Referral: NA Discharge Planning Services: CM Consult Post Acute Care Choice: Home Health Living arrangements for the past 2 months: Single Family Home Expected Discharge Date: 12/07/22               DME Arranged: Janina Mayo supplies DME Agency: [employer] Date DME Agency Contacted: 11/30/22 Time DME Agency Contacted: [phone] Representative spoke with at DME Agency: Marthann Schiller HH Arranged: RN HH Agency: [facility] Date [facility] Agency Contacted: 11/30/22 Time HH Agency Contacted: [phone] Representative spoke with at [facility] Agency: Amy   Social Determinants of Health (SDOH) Interventions SDOH Screenings   Food Insecurity: Patient Unable To Answer (11/27/2022)  Housing: Patient Unable To Answer (02/11/2022)  Transportation Needs: No Transportation Needs  (02/11/2022)  Utilities: Not At Risk (02/11/2022)  Depression (PHQ2-9): Low Risk  (12/30/2021)  Financial Resource Strain: Medium Risk (02/11/2022)  Tobacco Use: High Risk (11/28/2022)    Readmission Risk Interventions    11/29/2022    1:20 PM  Readmission Risk Prevention Plan  Transportation Screening Complete  HRI or Home Care Consult Complete  Social Work Consult for Recovery Care Planning/Counseling Complete  Palliative Care Screening Not Applicable  Medication Review Oceanographer) Referral to Pharmacy

## 2022-12-07 NOTE — Progress Notes (Addendum)
Discharge instructions reviewed with pt and SO who verbalized understanding.  Pt given written info re: healthy diet and trach care at home. Pt verbalized very good understanding of trach care.  Pt states she feels comfortable and confident in caring for the trach at home.  All questions answered, pt able to teach back discharge informations.  Pt states home trach supplies will be delivered at about 1600 after SO gets home from work.  Pt aware she will be discharged after confirmation that home supplies have been delivered/set up.  Pt's daughter will be giving her a ride home.  This RN will remove IV and tele monitor just prior to discharge.

## 2022-12-07 NOTE — Progress Notes (Signed)
Occupational Therapy Treatment/Discharge Patient Details Name: Meghan Contreras MRN: [id number] DOB: 08/29/1965 Today's Date: 12/07/2022   History of present illness Pt is a 57 y/o female admitted 9/28 with acute onset of SOB with progressive decline in spite of bronchodilator use to finally intubated.  Tracheostomy 9/29.  PMHx, anxiety, Bipolar d/o, COPD, depression, HOH, opiate addiction, PTSD, tobacco dependence.   OT comments  Pt has met 3/3 OT goals during acute stay. Pt has been managing ADLs and mobilizing in room without assistance. Focused session on UE HEP education using level 2-3 theraband to maximize strength and endurance. Pt able to return demo exercises well with handout provided to maximize carryover at home. No further skilled OT services needed at this time.      If plan is discharge home, recommend the following:      Equipment Recommendations  None recommended by OT    Recommendations for Other Services      Precautions / Restrictions Precautions Precautions: Other (comment) Precaution Comments: Monitor O2, relatively low fall risk Restrictions Weight Bearing Restrictions: No       Mobility Bed Mobility               General bed mobility comments: in chair on entry    Transfers Overall transfer level: Independent Equipment used: None Transfers: Sit to/from Stand Sit to Stand: Independent                 Balance Overall balance assessment: Needs assistance Sitting-balance support: No upper extremity supported, Feet supported Sitting balance-Leahy Scale: Good     Standing balance support: No upper extremity supported Standing balance-Leahy Scale: Good                             ADL either performed or assessed with clinical judgement   ADL Overall ADL's : Modified independent                                       General ADL Comments: has been using BSC independently, mobilizing in room.emphasis on UE HEP  education and progression, setting up routine at home and redirection when feeling need to smoke    Extremity/Trunk Assessment Upper Extremity Assessment Upper Extremity Assessment: Overall WFL for tasks assessed;Right hand dominant   Lower Extremity Assessment Lower Extremity Assessment: Defer to PT evaluation        Vision   Vision Assessment?: Wears glasses for reading   Perception     Praxis      Cognition Arousal: Alert Behavior During Therapy: [facility] for tasks assessed/performed Overall Cognitive Status: Within Functional Limits for tasks assessed                                          Exercises Exercises: General Upper Extremity General Exercises - Upper Extremity Shoulder Flexion: Strengthening, Both, 10 reps, Seated, Theraband Theraband Level (Shoulder Flexion): Level 2 (Red) Shoulder Horizontal ABduction: Strengthening, Both, 10 reps, Seated, Theraband Theraband Level (Shoulder Horizontal Abduction): Level 2 (Red) Elbow Flexion: Strengthening, Both, 10 reps, Seated, Theraband Theraband Level (Elbow Flexion): Level 2 (Red) Elbow Extension: Strengthening, Both, 5 reps, Seated, Theraband Theraband Level (Elbow Extension): Level 2 (Red)    Shoulder Instructions       General Comments  Pertinent Vitals/ Pain       Pain Assessment Pain Assessment: No/denies pain  Home Living                                          Prior Functioning/Environment              Frequency  Min 1X/week        Progress Toward Goals  OT Goals(current goals can now be found in the care plan section)  Progress towards OT goals: Progressing toward goals;Goals met/education completed, patient discharged from OT  Acute Rehab OT Goals Patient Stated Goal: stop smoking, home today OT Goal Formulation: With patient/family Time For Goal Achievement: 12/13/22 Potential to Achieve Goals: Good ADL Goals Pt/caregiver will Perform Home  Exercise Program: Increased strength;Both right and left upper extremity;With theraband;Independently;With written HEP provided Additional ADL Goal #1: Pt to verbalize 3 energy conservation strategies to implement at home Additional ADL Goal #2: Pt to increase standing tolerance > 10 min during ADLs, IADLs and mobility  Plan      Co-evaluation                 AM-PAC OT "6 Clicks" Daily Activity     Outcome Measure   Help from another person eating meals?: None Help from another person taking care of personal grooming?: None Help from another person toileting, which includes using toliet, bedpan, or urinal?: None Help from another person bathing (including washing, rinsing, drying)?: A Little Help from another person to put on and taking off regular upper body clothing?: None Help from another person to put on and taking off regular lower body clothing?: None 6 Click Score: 23    End of Session Equipment Utilized During Treatment: Oxygen  OT Visit Diagnosis: Other abnormalities of gait and mobility (R26.89)   Activity Tolerance Patient tolerated treatment well   Patient Left in chair;with call bell/phone within reach   Nurse Communication          Time: 8295-6213 OT Time Calculation (min): 16 min  Charges: OT General Charges $OT Visit: 1 Visit OT Treatments $Therapeutic Exercise: 8-22 mins  Bradd Canary, OTR/L Acute Rehab Services Office: [phone]   Lorre Munroe 12/07/2022, 8:39 AM

## 2022-12-07 NOTE — TOC Progression Note (Signed)
Transition of Care [facility]) - Progression Note    Patient Details  Name: Meghan Contreras MRN: [id number] Date of Birth: May 14, 1965  Transition of Care [facility]) CM/SW Contact  Lawerance Sabal, RN Phone Number: 12/07/2022, 10:18 AM  Clinical Narrative:     [employer] [phone] at [facility] who is checking with insurance to verify IF the patient has home health coverage.   Expected Discharge Plan: Home w Home Health Services Barriers to Discharge: Continued Medical Work up  Expected Discharge Plan and Services In-house Referral: NA Discharge Planning Services: CM Consult Post Acute Care Choice: Home Health Living arrangements for the past 2 months: Single Family Home                 DME Arranged: Trach supplies DME Agency: [employer] Date DME Agency Contacted: 11/30/22 Time DME Agency Contacted: [phone] Representative spoke with at DME Agency: Marthann Schiller HH Arranged: RN HH Agency: [facility] Date [facility] Agency Contacted: 11/30/22 Time HH Agency Contacted: [phone] Representative spoke with at [facility] Agency: Amy   Social Determinants of Health (SDOH) Interventions SDOH Screenings   Food Insecurity: Patient Unable To Answer (11/27/2022)  Housing: Patient Unable To Answer (02/11/2022)  Transportation Needs: No Transportation Needs (02/11/2022)  Utilities: Not At Risk (02/11/2022)  Depression (PHQ2-9): Low Risk  (12/30/2021)  Financial Resource Strain: Medium Risk (02/11/2022)  Tobacco Use: High Risk (11/28/2022)    Readmission Risk Interventions    11/29/2022    1:20 PM  Readmission Risk Prevention Plan  Transportation Screening Complete  HRI or Home Care Consult Complete  Social Work Consult for Recovery Care Planning/Counseling Complete  Palliative Care Screening Not Applicable  Medication Review Oceanographer) Referral to Pharmacy

## 2022-12-07 NOTE — Progress Notes (Signed)
Mobility Specialist Progress Note;   12/07/22 1155  Mobility  Activity Ambulated independently in hallway  Level of Assistance Standby assist, set-up cues, supervision of patient - no hands on  Assistive Device None  Distance Ambulated (ft) 500 ft  Activity Response Tolerated well  Mobility Referral Yes  $Mobility charge 1 Mobility  Mobility Specialist Start Time (ACUTE ONLY) 1155  Mobility Specialist Stop Time (ACUTE ONLY) 1205  Mobility Specialist Time Calculation (min) (ACUTE ONLY) 10 min   Pt agreeable to mobility. Required no physical assistance, only SV, during ambulation. No c/o and was asx during session. Pt left in bed with all needs met.   Caesar Bookman Mobility Specialist Please contact via SecureChat or Rehab Office [phone]

## 2022-12-07 NOTE — Progress Notes (Signed)
Speech Language Pathology Treatment: Meghan Contreras Speaking valve  Patient Details Name: Meghan Contreras MRN: [id number] DOB: 1965-10-01 Today's Date: 12/07/2022 Time: 8295-6213 SLP Time Calculation (min) (ACUTE ONLY): 8 min  Assessment / Plan / Recommendation Clinical Impression  Pt seen for intervention for speaking valve. She is scheduled to leave today and will be followed up by the trach clinic. Valve was donned upon SLP arrival and pt speaking in normal vocal quality and respirations able to support speech in conversation without distress. She states she is able to cough her secretions into her oral cavity now versus the trach. She demonstrated donning and doffing the valve without difficulty. SLP demonstrated how to remove the leash from velcro and verbally gave instructions on cleaning valve. Pt asked about taking a shower and SLP advised to wear the valve, avoid getting the trach wet and can tie a plastic bag around the trach. Reminded her remove valve when sleeping and pt states she does remove when sleeping. She had no other questions re: to her PMV.    HPI HPI: Meghan Contreras is a 57 y.o. year-old female with a history of COPD presenting to the ED with chief complaint of respiratory distress. Intubated 9/28-9/29 but noted consistent stridor and ENT consulted. 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 on 9/29. 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  Recommendations may be updated based on patient status, additional functional criteria and insurance authorization.    Recommendations                      Oral care BID     Aphonia (R49.1)     All goals met;Discharge SLP treatment due to (comment) (pt being discharged today)     [name]  12/07/2022, 10:55 AM

## 2022-12-07 NOTE — Discharge Summary (Addendum)
Physician Discharge Summary  Meghan Contreras WUJ:[id number] DOB: [birth year] DOA: 11/27/2022  PCP: Julieanne Manson, MD  Admit date: 11/27/2022 Discharge date: 12/07/2022  Admitted From: Home Disposition: Home  Recommendations for Outpatient Follow-up:  Follow up with PCP in 1 week with repeat CBC/BMP Outpatient follow-up with pulmonary/trach clinic/ENT.  Trach care as per ENT recommendations. Follow up in ED if symptoms worsen or new appear   Home Health: No Equipment/Devices: None  Discharge Condition: Stable CODE STATUS: Full Diet recommendation: Heart healthy  Brief/Interim Summary: 57 y.o. 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 She was found to have right lower lobe pneumonia and COPD exacerbation and started on antibiotics and steroids. 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 ENT was consulted who performed direct laryngoscopy and tracheostomy on 11/28/2022. She was transferred to [facility] service from 11/30/2022 onwards.  Subsequently, her condition is improved.  Surgical pathology was benign.  Pulmonary and ENT have cleared the patient for discharge with recommendations for outpatient follow-up with trach clinic and ENT.  Discharge patient home today if outpatient tracheostomy care gets arranged.  Discharge Diagnoses:   Acute respiratory failure with hypoxia and hypercapnia Secondary to pneumonia, COPD exacerbation complicated by upper airway obstruction.  Patient required intubation on 9/28 with eventual extubation on 9/29 after tracheostomy was placed.  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  Biopsy of left glottic mass was obtained: Surgical pathology was benign -Trach care as per ENT.  Tube exchange done on 12/03/2022 by ENT.  Will need outpatient follow-up with trach clinic.  Outpatient follow-up with ENT.  Trach care as per ENT recommendations. -Diet as per SLP. -Pulmonary and ENT have cleared the patient for discharge -Discharge patient home today if outpatient tracheostomy care gets arranged.   Right lower lobe pneumonia Patient was initially started empirically on Ceftriaxone/azithromycin with transition to Levaquin, secondary to B-lactam allergy, for treatment while in the ICU. -Has completed a course of Levaquin.    COPD exacerbation Patient managed on Solu-Medrol, Pulmicort, Brovana, Yupelri, addition to as needed albuterol. COPD exacerbation improved. -Continue Pulmicort, Roxy Manns -Pulmonology recommendation to de-escalate steroids quickly.  Patient has been transitioned to oral prednisone and has been on prednisone for more than 5 days.  DC prednisone on discharge.  Outpatient follow-up with pulmonary.   Hyperglycemia -Possibly related to steroids.  -A1c 5.8.  Outpatient follow-up.   Hypothyroidism -Continue Synthroid   Tobacco use -Pulmonology recommendations for lung cancer screening referral at discharge.   Chronic pain/chronic opiate dependence -Patient is managed on methadone as an outpatient  -Continue methadone.   Constipation -Continue bowel regimen   Obesity -Outpatient follow-up   Physical deconditioning -PT recommended no PT follow-up.   Hypokalemia -Resolved   Discharge Instructions  Discharge Instructions     Ambulatory referral to Pulmonology   Complete by: As directed    Trach care   Reason for referral: Other   Diet general   Complete by: As directed    Increase activity slowly   Complete by: As directed       Allergies as of 12/07/2022       Reactions   Amoxicillin Swelling, Rash    Latex  Hives, Swelling   Penicillins Other (See Comments)   Facial swelling        Medication List     STOP taking these medications    atorvastatin 40 MG tablet Commonly known as: LIPITOR   furosemide 40 MG tablet Commonly known as: LASIX   potassium chloride SA 20 MEQ tablet Commonly known as: KLOR-CON M       TAKE these medications    albuterol 108 (90 Base) MCG/ACT inhaler Commonly known as: VENTOLIN HFA Inhale 2 puffs into the lungs as needed for wheezing or shortness of breath.   arformoterol 15 MCG/2ML Nebu Commonly known as: BROVANA Take 2 mLs (15 mcg total) by nebulization 2 (two) times daily.   budesonide 0.5 MG/2ML nebulizer solution Commonly known as: PULMICORT Take 2 mLs (0.5 mg total) by nebulization 2 (two) times daily.   gabapentin 100 MG capsule Commonly known as: Neurontin Take 1 capsule (100 mg total) by mouth 3 (three) times daily.   ibuprofen 200 MG tablet Commonly known as: ADVIL Take 400-600 mg by mouth 2 (two) times daily as needed for moderate pain.   ipratropium-albuterol 0.5-2.5 (3) MG/3ML Soln Commonly known as: DUONEB Take 3 mLs by nebulization every 6 (six) hours as needed (shortness of breath/wheezing).   iron polysaccharides 150 MG capsule Commonly known as: NIFEREX Take 1 capsule (150 mg total) by mouth daily.   levothyroxine 150 MCG tablet Commonly known as: SYNTHROID Take 1 tablet (150 mcg total) by mouth daily before breakfast.   methadone 10 MG/ML solution Commonly known as: DOLOPHINE Take 55 mg by mouth daily. Verified with RN Mariea Clonts 54 mg   nicotine 21 mg/24hr patch Commonly known as: Nicoderm CQ Place 1 patch (21 mg total) onto the skin daily. What changed: Another medication with the same name was removed. Continue taking this medication, and follow the directions you see here.   polyethylene glycol 17 g packet Commonly known as: MIRALAX / GLYCOLAX Take 17 g by mouth daily as needed for mild  constipation.   prazosin 2 MG capsule Commonly known as: MINIPRESS Take 2 capsules (4 mg total) by mouth at bedtime.   QUEtiapine 300 MG tablet Commonly known as: SEROQUEL Take 1 tablet (300 mg total) by mouth at bedtime.   QUEtiapine 100 MG tablet Commonly known as: SEROQUEL 1 1/2 tabs in the morning   revefenacin 175 MCG/3ML nebulizer solution Commonly known as: YUPELRI Take 3 mLs (175 mcg total) by nebulization daily.   senna-docusate 8.6-50 MG tablet Commonly known as: Senokot-S Take 1 tablet by mouth 2 (two) times daily. Do not take if having loose stools What changed:  how much to take when to take this   Trelegy Ellipta 100-62.5-25 MCG/ACT Aepb Generic drug: Fluticasone-Umeclidin-Vilant Inhale 1 puff into the lungs daily.            Follow-up Information     Julieanne Manson, MD Follow up.   Specialty: Internal Medicine Why: GO: OCT. 3 AT 9:30AM Contact information: [address] [place] [address] [phone]         [employer] Follow up.   Why: Trach Arts development officer information: [phone]        [facility] Follow up.   Why: Agency will call you to set up apt times Contact information: [address] [place] [address] [phone]         Julieanne Manson, MD. Schedule an appointment as soon as possible for a visit in 1  week(s).   Specialty: Internal Medicine Contact information: [address] [place] [address] [phone]         Scarlette Ar, MD. Schedule an appointment as soon as possible for a visit in 1 week(s).   Specialty: Otolaryngology Contact information: [address] [place] [address] [phone]                Allergies  Allergen Reactions   Amoxicillin Swelling and Rash   Latex Hives and Swelling   Penicillins Other (See Comments)    Facial swelling    Consultations: ENT/PCCM   Procedures/Studies: CT SOFT TISSUE NECK W  CONTRAST  Result Date: 11/28/2022 CLINICAL DATA:  Stridor. Smoker. Epiglottitis or tonsillitis suspected. Previous thyroidectomy. EXAM: CT NECK WITH CONTRAST TECHNIQUE: Multidetector CT imaging of the neck was performed using the standard protocol following the bolus administration of intravenous contrast. RADIATION DOSE REDUCTION: This exam was performed according to the departmental dose-optimization program which includes automated exposure control, adjustment of the mA and/or kV according to patient size and/or use of iterative reconstruction technique. CONTRAST:  75mL OMNIPAQUE IOHEXOL 350 MG/ML SOLN COMPARISON:  08/04/2018 FINDINGS: Pharynx and larynx: There is a tracheostomy which appears well position without apparent complicating feature. The trachea is widely patent beyond the tracheostomy. There appears to be soft tissue swelling of the posterior oropharynx and hypopharynx extending all the way to the level of the glottis. This appears circumferential. The epiglottis is slightly thickened. No evidence of peritonsillar abscess or asymmetric tonsillar enlargement. The nature of this inflammation is uncertain. It could be angio edema, nonspecific pharyngitis or could possibly relate 2 radiation if that has been performed. The airway should be protected by the tracheostomy and I would not expect stridor if the patient is primarily moving air through the tracheostomy. Salivary glands: Parotid and submandibular glands are normal. Thyroid: Previous thyroidectomy. Small amount of air within the soft tissues of the lower neck and upper mediastinum, presumably subsequent to a recent procedure. Lymph nodes: No lymphadenopathy on either side of the neck. Vascular: No abnormal vascular finding. Limited intracranial: Normal Visualized orbits: Normal Mastoids and visualized paranasal sinuses: Clear Skeleton: Ordinary cervical spondylosis. Upper chest: Small bilateral pleural effusions layering dependently with  dependent atelectasis. Mild atelectasis of the medial left upper lobe adjacent to the tortuous aorta. Other: None IMPRESSION: 1. Tracheostomy appears well positioned without apparent complicating feature. The trachea is widely patent beyond the tracheostomy to the carina. 2. Circumferential soft tissue swelling of the posterior oropharynx and hypopharynx extending all the way to the level of the glottis. This appears circumferential. The epiglottis is slightly thickened. No evidence of peritonsillar abscess or asymmetric tonsillar enlargement. The airway should be protected by the tracheostomy and I would not expect stridor if the patient is primarily moving through the tracheostomy. The etiology of this pharyngitis pattern is unclear and could be infectious or angio edema. I do not see a history of radiation therapy in the medical record. 3. Small amount of air within the soft tissues of the lower neck and upper mediastinum, presumably subsequent to a recent procedure. 4. Small bilateral pleural effusions layering dependently with dependent atelectasis. Mild atelectasis of the medial left upper lobe adjacent to the tortuous aorta. Electronically Signed   By: Paulina Fusi M.D.   On: 11/28/2022 18:59   DG CHEST PORT 1 VIEW  Result Date: 11/28/2022 CLINICAL DATA:  Tracheostomy dependent. COPD. Acute respiratory failure. EXAM: PORTABLE CHEST 1 VIEW COMPARISON:  Chest radiograph dated 11/28/2022. FINDINGS:  Interval removal of the endotracheal tube and placement of a tracheostomy with tip above the carina. There are bibasilar atelectasis or infiltrate. Small pleural effusions may be present. No pneumothorax. Stable cardiomegaly. No acute osseous pathology. IMPRESSION: 1. Interval placement of a tracheostomy. 2. Bibasilar atelectasis or infiltrate. Electronically Signed   By: Elgie Collard M.D.   On: 11/28/2022 17:48   DG Chest 1 View  Result Date: 11/28/2022 CLINICAL DATA:  Status post intubation. EXAM: CHEST   1 VIEW COMPARISON:  11/27/2022 FINDINGS: The ET tube tip is above the carina. There is an enteric tube with tip coursing below the level of the GE junctions. Decreased lung volumes. Bilateral lower lobe atelectasis and/or infiltrates are again noted and appear unchanged from the previous exam. Osseous structures are intact. IMPRESSION: 1. ET tube tip is above the carina. 2. Persistent bilateral lower lobe atelectasis and/or infiltrates. Electronically Signed   By: Signa Kell M.D.   On: 11/28/2022 11:01   CT Angio Chest Pulmonary Embolism (PE) W or WO Contrast  Result Date: 11/27/2022 CLINICAL DATA:  Worsening shortness of breath EXAM: CT ANGIOGRAPHY CHEST WITH CONTRAST TECHNIQUE: Multidetector CT imaging of the chest was performed using the standard protocol during bolus administration of intravenous contrast. Multiplanar CT image reconstructions and MIPs were obtained to evaluate the vascular anatomy. RADIATION DOSE REDUCTION: This exam was performed according to the departmental dose-optimization program which includes automated exposure control, adjustment of the mA and/or kV according to patient size and/or use of iterative reconstruction technique. CONTRAST:  75mL OMNIPAQUE IOHEXOL 350 MG/ML SOLN COMPARISON:  Chest x-ray from earlier in the same day. FINDINGS: Cardiovascular: Thoracic aorta shows dilatation or dissection. Heart is mildly prominent. Pulmonary artery shows no filling defect to suggest pulmonary embolism. Mediastinum/Nodes: Thoracic inlet is within normal limits. Endotracheal tube and gastric catheter are noted in satisfactory position. The esophagus is within normal limits. Lungs/Pleura: Bilateral lower lobe consolidation is noted. No sizable effusion is seen. No parenchymal nodules are noted. Upper Abdomen: No acute abnormality. Musculoskeletal: No chest wall abnormality. No acute or significant osseous findings. Review of the MIP images confirms the above findings. IMPRESSION: Bilateral  lower lobe consolidation. Electronically Signed   By: Alcide Clever M.D.   On: 11/27/2022 01:54   CT HEAD WO CONTRAST ( )  Result Date: 11/27/2022 CLINICAL DATA:  Delirium EXAM: CT HEAD WITHOUT CONTRAST TECHNIQUE: Contiguous axial images were obtained from the base of the skull through the vertex without intravenous contrast. RADIATION DOSE REDUCTION: This exam was performed according to the departmental dose-optimization program which includes automated exposure control, adjustment of the mA and/or kV according to patient size and/or use of iterative reconstruction technique. COMPARISON:  None Available. FINDINGS: Brain: No evidence of acute infarction, hemorrhage, hydrocephalus, extra-axial collection or mass lesion/mass effect. Vascular: No hyperdense vessel or unexpected calcification. Skull: Normal. Negative for fracture or focal lesion. Sinuses/Orbits: No acute finding. Other: None. IMPRESSION: No acute intracranial abnormality noted. Electronically Signed   By: Alcide Clever M.D.   On: 11/27/2022 01:52   DG Chest Portable 1 View  Result Date: 11/27/2022 CLINICAL DATA:  Intubation, shortness of breath EXAM: PORTABLE CHEST 1 VIEW COMPARISON:  06/07/2022 FINDINGS: Endotracheal to is 3 cm above the carina. NG tube is in the stomach. Heart is borderline in size. Bilateral lower lobe airspace opacities. No visible effusions or pneumothorax. No acute bony abnormality. IMPRESSION: Bilateral lower lobe atelectasis or infiltrates. Electronically Signed   By: Charlett Nose M.D.   On: 11/27/2022 00:58  Subjective: Patient seen and examined at bedside.  Feels okay to go home today.  No fever, vomiting, chest pain reported.  Discharge Exam: Vitals:   12/07/22 0559 12/07/22 0855  BP: 139/89   Pulse: 62 64  Resp: 17 18  Temp: 98.5 F (36.9 C)   SpO2: 92% 95%    General: Pt is alert, awake, not in acute distress.  Trach present.  Looks chronically ill and deconditioned. Cardiovascular: rate  controlled, S1/S2 + Respiratory: bilateral decreased breath sounds at bases with scattered crackles Abdominal: Soft, obese, NT, ND, bowel sounds + Extremities: Trace lower extremity edema; no cyanosis    The results of significant diagnostics from this hospitalization (including imaging, microbiology, ancillary and laboratory) are listed below for reference.     Microbiology: No results found for this or any previous visit (from the past 240 hour(s)).   Labs: BNP (last 3 results) Recent Labs    06/07/22 1627 06/22/22 1304 11/27/22 0046  BNP 684.4* CANCELED 525.6*   Basic Metabolic Panel: Recent Labs  Lab 12/01/22 0252 12/02/22 0256  NA 140 138  K 3.3* 3.7  CL 104 102  CO2 28 26  GLUCOSE 110* 102*  BUN 21* 22*  CREATININE 1.01* 1.04*  CALCIUM 8.7* 8.4*  MG  --  2.1   Liver Function Tests: No results for input(s): "AST", "ALT", "ALKPHOS", "BILITOT", "PROT", "ALBUMIN" in the last 168 hours. No results for input(s): "LIPASE", "AMYLASE" in the last 168 hours. No results for input(s): "AMMONIA" in the last 168 hours. CBC: Recent Labs  Lab 12/01/22 0252  WBC 8.3  HGB 13.0  HCT 40.9  MCV 76.9*  PLT 217   Cardiac Enzymes: No results for input(s): "CKTOTAL", "CKMB", "CKMBINDEX", "TROPONINI" in the last 168 hours. BNP: Invalid input(s): "POCBNP" CBG: No results for input(s): "GLUCAP" in the last 168 hours. D-Dimer No results for input(s): "DDIMER" in the last 72 hours. Hgb A1c No results for input(s): "HGBA1C" in the last 72 hours. Lipid Profile No results for input(s): "CHOL", "HDL", "LDLCALC", "TRIG", "CHOLHDL", "LDLDIRECT" in the last 72 hours. Thyroid function studies No results for input(s): "TSH", "T4TOTAL", "T3FREE", "THYROIDAB" in the last 72 hours.  Invalid input(s): "FREET3" Anemia work up No results for input(s): "VITAMINB12", "FOLATE", "FERRITIN", "TIBC", "IRON", "RETICCTPCT" in the last 72 hours. Urinalysis    Component Value Date/Time    COLORURINE STRAW (A) 11/27/2022 0153   APPEARANCEUR CLEAR 11/27/2022 0153   LABSPEC 1.030 11/27/2022 0153   PHURINE 7.0 11/27/2022 0153   GLUCOSEU NEGATIVE 11/27/2022 0153   HGBUR NEGATIVE 11/27/2022 0153   BILIRUBINUR NEGATIVE 11/27/2022 0153   KETONESUR NEGATIVE 11/27/2022 0153   PROTEINUR NEGATIVE 11/27/2022 0153   NITRITE NEGATIVE 11/27/2022 0153   LEUKOCYTESUR NEGATIVE 11/27/2022 0153   Sepsis Labs Recent Labs  Lab 12/01/22 0252  WBC 8.3   Microbiology No results found for this or any previous visit (from the past 240 hour(s)).   Time coordinating discharge: 35 minutes  SIGNED:   Glade Lloyd, MD  [facility] 12/07/2022, 11:02 AM

## 2022-12-07 NOTE — Progress Notes (Signed)
Spoke with pt's husband and confirmed that trach supplies have been delivered/set up. [facility] nurse evaluated pt at the bedside, services have been set up.  Pt called daughter for a ride.  IV removed and tele monitor discontinued.  Pt has all belongings.  Pt dressed independently, wheelchair at the bedside, ready for discharge

## 2022-12-07 NOTE — Progress Notes (Signed)
Pt discharged via wheelchair without incident.

## 2022-12-09 ENCOUNTER — Telehealth: Payer: Self-pay | Admitting: Orthopedic Surgery

## 2022-12-09 ENCOUNTER — Telehealth: Payer: Self-pay

## 2022-12-09 NOTE — Telephone Encounter (Signed)
Meghan Contreras, [phone]) home nurse, called to request an order for a nebulizer sent to [facility] [phone]. Patient was prescribed nebulizer solution but not does have the equipment. Use would be 2 times a week for 2 weeks, then 1 time a week for 2 week. Would like PT evaluation for help with shortness of breath. Also needs a nutritionist evaluation.

## 2022-12-09 NOTE — Telephone Encounter (Signed)
Penny cates called from [facility] and had a referral from Dr. Lajoyce Corners for a patient that  was discharged. She thinks it could have been a mistake and wants to confirm with him. CB#[phone]. If so then she has a verbal order for 2 week 2 and then 1 week for 2 weeks. Also she needs and nebulizer machine and physical therapy evalution. CB#[phone]

## 2022-12-09 NOTE — Telephone Encounter (Signed)
I called and sw Boyd Kerbs advised that this is not our pt Dr. Lajoyce Corners has not seen her in the office and did not consult on her during her recent admission. She will call with any other questions.

## 2022-12-13 ENCOUNTER — Telehealth: Payer: Self-pay

## 2022-12-13 ENCOUNTER — Ambulatory Visit: Payer: 59 | Admitting: Internal Medicine

## 2022-12-13 NOTE — Telephone Encounter (Signed)
Patient cancelled her 12/13/2022 appointment. She would like to be on wait list for next available appointment.

## 2022-12-14 NOTE — Telephone Encounter (Signed)
[facility] [address]

## 2022-12-15 ENCOUNTER — Other Ambulatory Visit: Payer: Self-pay

## 2022-12-17 ENCOUNTER — Other Ambulatory Visit: Payer: Self-pay | Admitting: Internal Medicine

## 2022-12-17 DIAGNOSIS — Z930 Tracheostomy status: Secondary | ICD-10-CM

## 2022-12-17 NOTE — Progress Notes (Signed)
Needs order for Shiley disposable Inner Cannula [phone], appears to have 3 parts:  5.5 mm Inner Cannula I.D., 6.5 mm Tube I.D., and 62 mm Called and spoke with Meghan Contreras, [phone]) home nurse who only opened her case--she will have her current home nurse call our office so we have a contact. She felt the cannula would not be filled by a pharmacy and need to go through [facility] [phone] for those orders I called [employer] and left a message at above number Fax is [phone] and also sent photo of cannula requested to [email or web address].  with my contact info  Ultimately, able to connect with rep, they found order on email and will give to RT person. Spoke with partner, Meghan Contreras.

## 2022-12-20 NOTE — Telephone Encounter (Signed)
Meghan Contreras is her RT for respiratory supplies Going forward, Nayeliz or Ladene Artist need to directly contact [employer] at [phone] and request the needed supplies and not go through [place].   Also:  [email or web address] if we have a question.

## 2022-12-21 ENCOUNTER — Encounter: Payer: Self-pay | Admitting: Internal Medicine

## 2022-12-21 ENCOUNTER — Ambulatory Visit (INDEPENDENT_AMBULATORY_CARE_PROVIDER_SITE_OTHER): Payer: 59 | Admitting: Internal Medicine

## 2022-12-21 ENCOUNTER — Telehealth: Payer: Self-pay | Admitting: Internal Medicine

## 2022-12-21 VITALS — BP 138/98 | HR 76 | Temp 98.3°F | Resp 15 | Ht 61.0 in | Wt 184.0 lb

## 2022-12-21 DIAGNOSIS — R7303 Prediabetes: Secondary | ICD-10-CM

## 2022-12-21 DIAGNOSIS — J4489 Other specified chronic obstructive pulmonary disease: Secondary | ICD-10-CM

## 2022-12-21 DIAGNOSIS — J381 Polyp of vocal cord and larynx: Secondary | ICD-10-CM | POA: Insufficient documentation

## 2022-12-21 DIAGNOSIS — E039 Hypothyroidism, unspecified: Secondary | ICD-10-CM

## 2022-12-21 DIAGNOSIS — Z716 Tobacco abuse counseling: Secondary | ICD-10-CM

## 2022-12-21 DIAGNOSIS — R030 Elevated blood-pressure reading, without diagnosis of hypertension: Secondary | ICD-10-CM

## 2022-12-21 DIAGNOSIS — K029 Dental caries, unspecified: Secondary | ICD-10-CM | POA: Diagnosis not present

## 2022-12-21 DIAGNOSIS — Z23 Encounter for immunization: Secondary | ICD-10-CM

## 2022-12-21 DIAGNOSIS — Z720 Tobacco use: Secondary | ICD-10-CM

## 2022-12-21 DIAGNOSIS — Z930 Tracheostomy status: Secondary | ICD-10-CM | POA: Diagnosis not present

## 2022-12-21 DIAGNOSIS — K5900 Constipation, unspecified: Secondary | ICD-10-CM

## 2022-12-21 MED ORDER — NICOTINE 7 MG/24HR TD PT24: 7.0000 mg | MEDICATED_PATCH | Freq: Every day | TRANSDERMAL | 0 refills | Status: DC

## 2022-12-21 MED ORDER — NICOTINE 14 MG/24HR TD PT24: 14.0000 mg | MEDICATED_PATCH | Freq: Every day | TRANSDERMAL | 0 refills | Status: DC

## 2022-12-21 NOTE — Telephone Encounter (Signed)
Patient has been seen.

## 2022-12-21 NOTE — Patient Instructions (Addendum)
[facility] [place] [phone] for refills of trach supplies [email or web address].  Sandria Manly is your respiratory tech and contact.    Work on diet:    Drink a glass of water before every meal Drink 6-8 glasses of water daily Eat three meals daily Eat a protein and healthy fat with every meal (eggs,fish, chicken, [place] and limit red meats) Eat 5 servings of vegetables daily, mix the colors Eat 2 servings of fruit daily with skin, if skin is edible Use smaller plates Put food/utensils down as you chew and swallow each bite Eat at a table with friends/family at least once daily, no TV Do not eat in front of the TV  Recent studies show that people who consume all of their calories in a 12 hour period lose weight more efficiently.  For example, if you eat your first meal at 7:00 a.m., your last meal of the day should be completed by 7:00 p.m.

## 2022-12-21 NOTE — Progress Notes (Signed)
Subjective:    Patient ID: Meghan Contreras, female   DOB: 03-Apr-1965, 57 y.o.   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  Patient states she never really felt she improved after COVID for which I called in treatment at the beginning of August.   She was also diagnosed with RLL pneumonia and treated inpatient with Ceftriaxone and Azithromycin.  She was to finish treatment with oral Levaquin, but she cannot recall if she ever obtained that antibiotic.   Not clear if the plan is to remove the polyp and see if easier to close up trach or not.  She is not clear on this. Has not smoked for 27 days!! Listed in her meds are 5 inhaled solutions:  Arformoterol, Pulmicort, Yupelri and Duoneb is the only one to use as needed.  She is not using the Trelegy inhaler at this point. All of the above, including the Trelegy were listed to continue at discharge, but there is a lot of overlap  2.  Microcytosis:  History of iron def.  States she is not sure if she ever filled Niferex.  Is concerned as she feels iron causes her to get constipated and then has more issues with a hernia--points to supraumbilical area.  3.  Tobacco Use Disorder:  on 21 mg patches nicotine. She would like to go another month with the 21 mg patches.  She also feels she will likely need a month of the lower dose patches as well.  Partner, Ladene Artist continues to smoke outside at home.  4.  Has multiple teeth that are loose and molars are caried and breaking off.  She has no teeth on top.  She is not certain if she has dental coverage  5.  Prediabetes:  A1C on 10/4 was down slightly to 5.9%.  Drinks 20 oz coke daily and states she loves and eats junk food.    6.  Hypothyroidism:  TSH up in April again.  She denies missing her hormone, but we have been here before with missing meds regularly.  7.  Mild renal  insufficiency during hospitalization.  Discharged with last BUN/Crea on 12/03/2022:  22/1.04  Current Meds  Medication Sig   albuterol (VENTOLIN HFA) 108 (90 Base) MCG/ACT inhaler Inhale 2 puffs into the lungs as needed for wheezing or shortness of breath.   arformoterol (BROVANA) 15 MCG/2ML NEBU Take 2 mLs (15 mcg total) by nebulization 2 (two) times daily.   budesonide (PULMICORT) 0.5 MG/2ML nebulizer solution Take 2 mLs (0.5 mg total) by nebulization 2 (two) times daily.   gabapentin (NEURONTIN) 100 MG capsule Take 1 capsule (100 mg total) by mouth 3 (three) times daily.   ibuprofen (ADVIL) 200 MG tablet Take 400-600 mg by mouth 2 (two) times daily as needed for moderate pain.   ipratropium-albuterol (DUONEB) 0.5-2.5 (3) MG/3ML SOLN Take 3 mLs by nebulization every 6 (six) hours as needed (shortness of breath/wheezing).   levothyroxine (SYNTHROID) 150 MCG tablet Take 1 tablet (150 mcg total) by mouth daily before breakfast.   methadone (DOLOPHINE) 10 MG/ML solution Take 55 mg by mouth daily. Verified with RN Mariea Clonts 54 mg   nicotine (NICODERM CQ) 21 mg/24hr patch Place 1 patch (21 mg total) onto the skin daily.   polyethylene glycol (MIRALAX / GLYCOLAX) 17 g packet Take 17 g by mouth daily as needed for mild constipation.  prazosin (MINIPRESS) 2 MG capsule Take 2 capsules (4 mg total) by mouth at bedtime.   QUEtiapine (SEROQUEL) 100 MG tablet 1 1/2 tabs in the morning   QUEtiapine (SEROQUEL) 300 MG tablet Take 1 tablet (300 mg total) by mouth at bedtime.   revefenacin (YUPELRI) 175 MCG/3ML nebulizer solution Take 3 mLs (175 mcg total) by nebulization daily.   senna-docusate (SENOKOT-S) 8.6-50 MG tablet Take 1 tablet by mouth 2 (two) times daily. Do not take if having loose stools    Allergies  Allergen Reactions   Amoxicillin Swelling and Rash   Latex Hives and Swelling   Penicillins Other (See Comments)    Facial swelling     Review of Systems    Objective:   BP (!)  138/98 (BP Location: Left Arm, Patient Position: Sitting, Cuff Size: Normal)   Pulse 76   Temp 98.3 F (36.8 C)   Resp 15   Ht 5\' 1"  (1.549 m)   Wt 184 lb (83.5 kg)   BMI 34.77 kg/m   Physical Exam HEENT:  molars broken off at gums with brown discoloration.  One molar on right lower jaw with much of root exposed.  All of lower incisors with significant surrounding gingival recession and loose/discolored.  No surrounding gingival swelling or erythema. Neck:  Supple, no adenopathy.  Trach intact and clean Lungs:  Upper airway noise mainly with exhalation.   CV:  RRR without murmur or rub.  Radial and DP pulses normal and equal LE with mild pitting edema Abd:  ventral hernia--she was having difficulty relaxing abdomen as uncomfortable in supine position, but hernia is soft and feels reducible.  NT, No HSM or mass   Assessment & Plan   Hx of COVID in August:  recommended new COVID vaccine end of November.  Likely secondary bacterial pneumonia, RLL, resolved clinically.  2.  COPD/tobacco use disorder:    Complete 2 months of 21 mg patch, followed by 28 days each of 14 mg, then 7 mg patches.   She will call in what she actually has at home regarding respiratory meds.   Addendum:  she is using only nebulized solutions:  pulmicort and albuterol/atrovent solutions.  She does not have Arformoterol or Trelegy ellipta. Will need to discuss having a long acting beta agonist with her current use of meds--call into pharmacy shows she has not picked up Trelegy ellipta since 09/23/22 and has never picked up Yupelri or Arformoterol.    3.  Microcytosis with history of iron deficiency:  encouraged her to obtain Niferex and get started.  Repeat CBC in next week.  4.  Mild renal insufficiency during recent hospitalization:  CmP with labs in 1 week.    5.  Hypothyroidism:  TSH with above labs  6.  Glottal/laryngeal mass, benign:  as per ENT.  Trach intact.  Shared contact info for respiratory services with  Loyal Buba, Sandria Manly to contact directly for trach supplies.  7.  Dental decay:  information given for possible dentists--not sure who takes her insurance.    8.  Prediabetes:  improved glucose control  9.  HM:  Influenza and Pneumococcal 20 vaccines today  8.

## 2022-12-21 NOTE — Telephone Encounter (Signed)
Patient called stating she is using the following nebulizer solutions *Budesonide (PULMICORT) *Ipratropium-albuterol solution   Patient states she does not use  Arformoterol

## 2022-12-22 DIAGNOSIS — Z23 Encounter for immunization: Secondary | ICD-10-CM | POA: Diagnosis not present

## 2022-12-24 ENCOUNTER — Other Ambulatory Visit: Payer: 59

## 2023-01-05 ENCOUNTER — Telehealth: Payer: Self-pay

## 2023-01-05 ENCOUNTER — Other Ambulatory Visit: Payer: Self-pay

## 2023-01-05 NOTE — Telephone Encounter (Signed)
Patient's home nurse called to request tracheostomy tie order to be sent to any medical supply store.

## 2023-01-06 NOTE — Telephone Encounter (Signed)
Informed the nurse that they just need to call [employer] directly to request medical supplies. Nurse is planning to call them to get it set up.

## 2023-01-12 ENCOUNTER — Telehealth: Payer: Self-pay | Admitting: Internal Medicine

## 2023-01-12 DIAGNOSIS — Z930 Tracheostomy status: Secondary | ICD-10-CM | POA: Insufficient documentation

## 2023-01-12 NOTE — Telephone Encounter (Signed)
[facility] [address]:  she has not picked up Trelegy Ellipta since 09/23/22 She has never picked up Yupelri nor Arfomoterol  Ms. Balash shared after her visit via phone she was only using Duoneb and pulmicort nebulized meds  Called and only able to get her partner, Ladene Artist on the phone:  discussed all of the inhaled meds ordered at time of discharge and problem with redundancy She is not on a LABA currently and using an older anticholinergic agent with atrovent in duoneb.  Discussed she could go back to once daily inhalation of Trelegy ellipta and use albuterol nebulized alone as needed for dyspnea. Need to know with the trach if she finds the nebulized meds more easily used and efficacious.   Would be less costly, likely as well, using former combination as opposed to using several different meds nebulized  Ladene Artist will speak with her and get back this afternoon.  Tried to call her home number, where she is able to read incoming calls with her decreased hearing, but no answer and voicemail is full.

## 2023-01-13 NOTE — Telephone Encounter (Signed)
Spoke with Rosanne Ashing, she reports that she finds nebulized meds ineffective. Describes that when she uses nebulized meds, she feels that she did not get any medication.

## 2023-01-17 MED ORDER — ALBUTEROL SULFATE HFA 108 (90 BASE) MCG/ACT IN AERS: 2.0000 | INHALATION_SPRAY | Freq: Four times a day (QID) | RESPIRATORY_TRACT | 4 refills | Status: AC | PRN

## 2023-01-17 MED ORDER — TRELEGY ELLIPTA 100-62.5-25 MCG/ACT IN AEPB: 1.0000 | INHALATION_SPRAY | Freq: Every day | RESPIRATORY_TRACT | 11 refills | Status: DC

## 2023-01-19 NOTE — Telephone Encounter (Signed)
Voicemail left asking to return our call 

## 2023-01-20 ENCOUNTER — Telehealth (HOSPITAL_COMMUNITY): Payer: Self-pay

## 2023-01-20 NOTE — Telephone Encounter (Signed)
Medication managment - Attempted to call pt back after she left a message at the [facility] requesting Dr. Doyne Keel send in all new refills for patient. No answer at pt's and voicemail was full. Patient not seen since 08/17/22, no showed 09/16/22 and has not rescheduled.

## 2023-01-21 ENCOUNTER — Other Ambulatory Visit (HOSPITAL_COMMUNITY): Payer: Self-pay | Admitting: Psychiatry

## 2023-01-21 DIAGNOSIS — F411 Generalized anxiety disorder: Secondary | ICD-10-CM

## 2023-01-21 MED ORDER — PRAZOSIN HCL 2 MG PO CAPS: 4.0000 mg | ORAL_CAPSULE | Freq: Every day | ORAL | 3 refills | Status: DC

## 2023-01-21 MED ORDER — NICOTINE 21 MG/24HR TD PT24: 21.0000 mg | MEDICATED_PATCH | Freq: Every day | TRANSDERMAL | 3 refills | Status: DC

## 2023-01-21 MED ORDER — QUETIAPINE FUMARATE 300 MG PO TABS: 300.0000 mg | ORAL_TABLET | Freq: Every day | ORAL | 3 refills | Status: DC

## 2023-01-21 MED ORDER — GABAPENTIN 100 MG PO CAPS
100.0000 mg | ORAL_CAPSULE | Freq: Three times a day (TID) | ORAL | 3 refills | Status: DC
Start: 2023-01-21 — End: 2023-03-21

## 2023-01-21 MED ORDER — QUETIAPINE FUMARATE 100 MG PO TABS: ORAL_TABLET | ORAL | 3 refills | Status: DC

## 2023-01-21 NOTE — Telephone Encounter (Signed)
Medication sent to preferred pharmacy.  Patient will need to reschedule for further refills.

## 2023-01-21 NOTE — Telephone Encounter (Signed)
Medication mangement - Message left with pt's brother of orders sent in as requested by pt and her need to make a new appointment with Dr. Doyne Keel. Collateral stated pt could not hear well and was giving her this information.

## 2023-01-26 NOTE — Telephone Encounter (Signed)
Patients partner has been notified of instructions.

## 2023-02-02 ENCOUNTER — Ambulatory Visit (HOSPITAL_COMMUNITY)
Admission: RE | Admit: 2023-02-02 | Discharge: 2023-02-02 | Disposition: A | Discharge status: Home / Self Care | Payer: 59 | Source: Ambulatory Visit | Attending: Acute Care | Admitting: Acute Care

## 2023-02-02 DIAGNOSIS — J4489 Other specified chronic obstructive pulmonary disease: Secondary | ICD-10-CM | POA: Diagnosis not present

## 2023-02-02 DIAGNOSIS — J381 Polyp of vocal cord and larynx: Secondary | ICD-10-CM | POA: Insufficient documentation

## 2023-02-02 DIAGNOSIS — R061 Stridor: Secondary | ICD-10-CM | POA: Diagnosis present

## 2023-02-02 DIAGNOSIS — J9611 Chronic respiratory failure with hypoxia: Secondary | ICD-10-CM | POA: Diagnosis not present

## 2023-02-02 DIAGNOSIS — Z930 Tracheostomy status: Secondary | ICD-10-CM | POA: Diagnosis present

## 2023-02-02 DIAGNOSIS — J42 Unspecified chronic bronchitis: Secondary | ICD-10-CM | POA: Diagnosis not present

## 2023-02-02 NOTE — Progress Notes (Signed)
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  Was intubated, initially treated for PNA, extubated and developed post-extubation stridor. ENT was consulted for this and post extubation dysphonia. 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 Went for trach 9/29. Mass biopsied. Was benign. Ultimately discharged 10/8. Since dc I do not see f/u w/ ENT. Has been seen by PCP. Getting her trach supplies from [employer] [phone] Presents today for trach f/u   Review of Systems  Constitutional:  Negative for chills and fever.  HENT: Negative.    Eyes: Negative.   Respiratory: Negative.    Cardiovascular: Negative.   Gastrointestinal:  Positive for heartburn.  Genitourinary: Negative.   Musculoskeletal: Negative.   Neurological: Negative.   Endo/Heme/Allergies: Negative.     Exam  General 57 year old female who presents to clinic today for planned trach change. She is in no distress HENT NCAT # 4 cuffless trach stoma is unremarkable. Fairly good phonation quality w/ PMV. No upper airway wheezing or rush of air when PMV removed Pulm clear  Card rrr Abd soft Ext warm and dry  Neuro intact   Impression/plan  Active Problems:   Tracheostomy in place [facility])   COPD (chronic obstructive pulmonary disease) with chronic bronchitis (HCC)   Chronic hypoxic respiratory failure (HCC)   Laryngeal polyp  Tracheostomy in [facility]) Overview: Trach size 4 cuffless (last change 10/4 in hospital).  DME [facility] [phone] ENT Scarlette Ar. Has not had f/u as of 12/4  Discussion  Doing well. Trach stable. I doubt a candidate for decannulation unless there have been significant changes in her VC paralysis as it seems as though this was a large contributing factor to her baseline symptom burden.  Would also need to be sure vocal cord mass (although benign) has not returned. Would only consider decannulation in coordination w/ ENT and certainly not w/out their input  Plan Cont routine trach care ROV 8 weeks for trach change  Will reach out to her DME for PMV orders Have provided her w. Dr  Harvie Junior office number and encouraged her to f/u with him at least for f/u [facility] to evaluate where we are. It looks like she was supposed to see him back in Oct, phone message left but pt reports to me has some issues w/ memory from time to time     My time 26 minutes

## 2023-02-02 NOTE — Progress Notes (Signed)
Tracheostomy Procedure Note  Meghan Contreras [id number] [birth year]  Pre Procedure Tracheostomy Information  Trach Brand: Shiley Size:  [id number] Style: Uncuffed Secured by: Velcro   Procedure: Trach change    Post Procedure Tracheostomy Information  Trach BrandVassie Loll Size:  [id number] Style: Uncuffed Secured by: Velcro   Post Procedure Evaluation:  ETCO2 positive color change from yellow to purple : Yes.   Patients current condition: stable Complications: No apparent complications Trach site exam: clean, dry Wound care done: dry, sterile, and 4 x 4 gauze Patient did tolerate procedure well.   Education: DoneJanina Mayo cleaning/care.  Prescription needs: None    Additional needs: PMV x2 given to pt.

## 2023-03-10 ENCOUNTER — Other Ambulatory Visit: Payer: Self-pay | Admitting: Internal Medicine

## 2023-03-10 ENCOUNTER — Other Ambulatory Visit (HOSPITAL_COMMUNITY): Payer: Self-pay | Admitting: Psychiatry

## 2023-03-10 DIAGNOSIS — F411 Generalized anxiety disorder: Secondary | ICD-10-CM

## 2023-03-10 DIAGNOSIS — F3170 Bipolar disorder, currently in remission, most recent episode unspecified: Secondary | ICD-10-CM

## 2023-03-14 NOTE — Telephone Encounter (Signed)
 Can you call Ms. Colombo and find out what mg of patch she is currently using--appears her dose was changed at [facility]

## 2023-03-18 NOTE — Telephone Encounter (Signed)
Please call the patient and find out what dose she is currently using. I see the request from the pharmacy, but it is not clear what she actually is doing.

## 2023-03-21 ENCOUNTER — Encounter (HOSPITAL_COMMUNITY): Payer: Self-pay | Admitting: Psychiatry

## 2023-03-21 ENCOUNTER — Ambulatory Visit (INDEPENDENT_AMBULATORY_CARE_PROVIDER_SITE_OTHER): Payer: Medicaid Other | Admitting: Psychiatry

## 2023-03-21 VITALS — BP 135/94 | HR 92 | Temp 99.0°F | Wt 214.4 lb

## 2023-03-21 DIAGNOSIS — F411 Generalized anxiety disorder: Secondary | ICD-10-CM

## 2023-03-21 DIAGNOSIS — F3170 Bipolar disorder, currently in remission, most recent episode unspecified: Secondary | ICD-10-CM | POA: Diagnosis not present

## 2023-03-21 MED ORDER — QUETIAPINE FUMARATE 100 MG PO TABS
ORAL_TABLET | ORAL | 3 refills | Status: DC
Start: 2023-03-21 — End: 2023-06-01

## 2023-03-21 MED ORDER — QUETIAPINE FUMARATE 300 MG PO TABS
300.0000 mg | ORAL_TABLET | Freq: Every day | ORAL | 3 refills | Status: DC
Start: 2023-03-21 — End: 2023-06-01

## 2023-03-21 MED ORDER — GABAPENTIN 300 MG PO CAPS
300.0000 mg | ORAL_CAPSULE | Freq: Three times a day (TID) | ORAL | 3 refills | Status: DC
Start: 2023-03-21 — End: 2023-06-01

## 2023-03-21 NOTE — Progress Notes (Signed)
BH MD/PA/NP OP Progress Note       03/21/2023 3:20 PM Meghan Contreras  MRN:  [id number]  Chief Complaint: "I have been having  panic attack"  HPI: 58 year old female seen today for follow up psychiatric evaluation.  She has a psychiatric history of depression, PTSD, bipolar affective disorder, tobacco dependence, and opioid addiction (in remission five years).  Patient is currently prescribed gabapentin 100 mg 3 times daily, Seroquel 300 mg nightly, Seroquel 150 mg daily, NicoDerm CQ 21 mg patches, and prazosin 4 mg nightly by her PCP. She notes that she has not used her Nicorette patches in a few days and reports that her other medications are somewhat effective in managing her psychiatric conditions.   Today she was well-groomed, pleasant, cooperative, and engaged in conversation.  Patient notes that she has been having more panic attacks.  Provider asked patient if something specific was exacerbating this and she notes that she was uncertain.  Provider asked patient if anything is changed recently with her health.  She informed Clinical research associate that in September she stopped smoking because she had difficulty breathing.  Patient notes that later in the month she got a trach placed and has been breathing better.  She notes that her husband changes her trach weekly but notes that at times she fears doing it.  She does note that she wants to learn as her husband may not always be able to do it.  Provider asked patient if it be exacerbating her anxiety and she notes that she believes that she also notes that recently she has been watching her 89-month old granddaughter which is rewarding but at times taxing.  She notes that her son and the child's mother will be moving to [place] in a few months which is somewhat stressful for her.    Patient notes that the above exacerbates her anxiety and depression and request to have gabapentin increased.  Provider was agreeable to this.  Today provider conducted a GAD-7 and  patient scored a 9.  Provider also conducted a PHQ 9 and patient scored a 5.  She endorsed adequate sleep and increased appetite.  Patient notes that since she has stopped smoking she has gained over 20 pounds.  She does note recently that she was started on metformin has not yet started it.  Provider informed patient that she may lose weight while on metformin and informed her if she need other weight loss assistance she can be referred to a nutritionist.  Provider also encouraged patient to talk to her primary care doctor about weight management.  She endorsed understanding and agreed.  Today she denies SI/HI/AVH, mania, paranoia.   At this time patient does not wish to continue her Nicorette patches.  Today gabapentin 100 mg 3 times daily increased to 300 mg 3 times daily to help manage anxiety.  She will continue her other medications as prescribed.  No other concerns noted at this time.     Visit Diagnosis:    ICD-10-CM   1. 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)  F31.70 QUEtiapine (SEROQUEL) 300 MG tablet    QUEtiapine (SEROQUEL) 100 MG tablet    gabapentin (NEURONTIN) 300 MG capsule    2. Generalized anxiety disorder  F41.1 QUEtiapine (SEROQUEL) 300 MG tablet    QUEtiapine (SEROQUEL) 100 MG tablet    gabapentin (NEURONTIN) 300 MG capsule       Past Psychiatric History: depression, PTSD, bipolar affective disorder, tobacco dependence, and opioid addiction (in remission four years).  Past  Medical History:  Past Medical History:  Diagnosis Date   Anemia    Anxiety    Bipolar disorder (HCC)    COPD (chronic obstructive pulmonary disease) (HCC)    Depression    Elevated brain natriuretic peptide (BNP) level    Elevated serum creatinine    HOH (hard of hearing)    Hyperglycemia    Hyperlipidemia 2021   Hyperthyroidism    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  Old records from [facility] with CT of soft tissue of chest and neck 11/2017:  No findings to suggest cause of stridorous breathing and no obvious lung disease.     Tobacco dependence     Past Surgical History:  Procedure Laterality Date   DIRECT LARYNGOSCOPY N/A 11/28/2022   Procedure: DIRECT LARYNGOSCOPY;  Surgeon: Scarlette Ar, MD;  Location: [facility] OR;  Service: ENT;  Laterality: N/A;   ROBOTIC ASSISTED BILATERAL SALPINGO OOPHERECTOMY N/A 01/08/2022   Procedure: XI ROBOTIC ASSISTED BILATERAL SALPINGO OOPHORECTOMY;  Surgeon: Carver Fila, MD;  Location: [facility] ORS;  Service: Gynecology;  Laterality: N/A;   THYROIDECTOMY  2019   TRACHEOSTOMY TUBE PLACEMENT N/A 11/28/2022   Procedure: TRACHEOSTOMY;  Surgeon: Scarlette Ar, MD;  Location: [facility] OR;  Service: ENT;  Laterality: N/A;    Family Psychiatric History: Substance use sister  Family History:  Family History  Problem Relation Age of Onset   HIV/AIDS Mother        from drug abuse   Drug abuse Mother    Drug abuse Brother    Hepatitis C Brother        From IV drug abuse   Drug abuse Sister    Drug abuse Sister    Drug abuse Sister    Drug abuse Sister    Drug abuse Sister    Cancer Sister        lung   Drug abuse Sister     Social History:  Social History   Socioeconomic History   Marital status: Legally Separated    Spouse name: Not on file   Number of children: 2   Years of education: Not on file   Highest education level: Some college, no degree  Occupational History   Occupation: unemployed    Comment: Supported by daughter, ex husband and boyfriend.  Tobacco Use   Smoking status: Every Day    Current packs/day: 0.25    Average packs/day: 0.3 packs/day for 42.0 years (10.5 ttl pk-yrs)    Types: Cigarettes    Passive exposure: Current   Smokeless tobacco: Never    Tobacco comments:    Taking patch off at night and then smokes after 9 p.m.  Vaping Use   Vaping status: Never Used  Substance and Sexual Activity   Alcohol use: No   Drug use: Not Currently    Comment: Benzodiazepines / Klonopin/opiate addiction.  Also history of crack cocaine abuse.  2021 clean for 2017 on Suboxone.    Sexual activity: Not Currently  Other Topics Concern   Not on file  Social History Narrative    Lives alone, though her boyfriend will visit for 2 months at a time.   He lives with  his daughter in [place] otherwise.   Social Drivers of Health   Financial Resource Strain: Medium Risk (02/11/2022)   Overall Financial Resource Strain (CARDIA)    Difficulty of Paying Living Expenses: Somewhat hard  Food Insecurity: Patient Unable To Answer (11/27/2022)   Hunger Vital Sign    Worried About Running Out of Food in the Last Year: Patient unable to answer    Ran Out of Food in the Last Year: Patient unable to answer  Transportation Needs: No Transportation Needs (02/11/2022)   PRAPARE - Administrator, Civil Service (Medical): No    Lack of Transportation (Non-Medical): No  Physical Activity: Not on file  Stress: Not on file  Social Connections: Not on file    Allergies:  Allergies  Allergen Reactions   Amoxicillin Swelling and Rash   Latex Hives and Swelling   Penicillins Other (See Comments)    Facial swelling    Metabolic Disorder Labs: Lab Results  Component Value Date   HGBA1C 5.8 (H) 12/01/2022   MPG 119.76 12/01/2022   MPG 119.76 01/07/2022   No results found for: "PROLACTIN" Lab Results  Component Value Date   CHOL 131 06/22/2022   TRIG 110 11/28/2022   HDL 30 (L) 06/22/2022   LDLCALC 75 06/22/2022   LDLCALC 150 (H) 11/04/2020   Lab Results  Component Value Date   TSH 5.690 (H) 06/22/2022   TSH 8.640 (H) 05/14/2021    Therapeutic Level Labs: No results found for: "LITHIUM" No results found for: "VALPROATE" No results found for:  "CBMZ"  Current Medications: Current Outpatient Medications  Medication Sig Dispense Refill   albuterol (VENTOLIN HFA) 108 (90 Base) MCG/ACT inhaler Inhale 2 puffs into the lungs every 6 (six) hours as needed for wheezing or shortness of breath. 18 g 4   Fluticasone-Umeclidin-Vilant (TRELEGY ELLIPTA) 100-62.5-25 MCG/ACT AEPB Inhale 1 puff into the lungs daily. 1 each 11   gabapentin (NEURONTIN) 300 MG capsule Take 1 capsule (300 mg total) by mouth 3 (three) times daily. 90 capsule 3   ibuprofen (ADVIL) 200 MG tablet Take 400-600 mg by mouth 2 (two) times daily as needed for moderate pain.     iron polysaccharides (NIFEREX) 150 MG capsule Take 1 capsule (150 mg total) by mouth daily. (Patient not taking: Reported on 12/21/2022) 30 capsule 4   levothyroxine (SYNTHROID) 150 MCG tablet Take 1 tablet (150 mcg total) by mouth daily before breakfast. 30 tablet 11   methadone (DOLOPHINE) 10 MG/ML solution Take 55 mg by mouth daily. Verified with RN Mariea Clonts 54 mg     nicotine (NICODERM CQ) 21 mg/24hr patch Place 1 patch (21 mg total) onto the skin daily. 28 patch 3   polyethylene glycol (MIRALAX / GLYCOLAX) 17 g packet Take 17 g by mouth daily as needed for mild constipation. 14 each 0   prazosin (MINIPRESS) 2 MG capsule Take 2 capsules (4 mg total) by mouth at bedtime. 60 capsule 3   QUEtiapine (SEROQUEL) 100 MG tablet 1 1/2 tabs in the morning 45 tablet 3   QUEtiapine (SEROQUEL) 300 MG tablet Take 1 tablet (300 mg total) by mouth at bedtime. 30 tablet 3   senna-docusate (SENOKOT-S) 8.6-50 MG tablet Take 1 tablet by mouth 2 (two) times daily. Do not take if having loose stools 60 tablet 0   No current facility-administered medications for this visit.     Musculoskeletal: Strength & Muscle Tone: within normal limits Gait & Station: normal Patient leans: N/A  Psychiatric Specialty  Exam: Review of Systems  There were no vitals taken for this visit.There is no height or weight on file to  calculate BMI.  General Appearance: Well Groomed  Eye Contact:  Good  Speech:  Clear and Coherent and Normal Rate  Volume:  Normal  Mood:  Euthymic, mild anxiety about health  Affect:  Appropriate and Congruent  Thought Process:  Coherent, Goal Directed, and Linear  Orientation:  Full (Time, Place, and Person)  Thought Content: WDL and Logical   Suicidal Thoughts:  No  Homicidal Thoughts:  No  Memory:  Immediate;   Good Recent;   Good Remote;   Good  Judgement:  Good  Insight:  Good  Psychomotor Activity:  Normal  Concentration:  Concentration: Good and Attention Span: Good  Recall:  Good  Fund of Knowledge: Good  Language: Good  Akathisia:  No  Handed:  Right  AIMS (if indicated): not done  Assets:  Communication Skills Desire for Improvement Financial Resources/Insurance Housing Intimacy Leisure Time Social Support  ADL's:  Intact  Cognition: WNL  Sleep:  Good   Screenings: GAD-7    Flowsheet Row Clinical Support from 03/21/2023 in [facility] Clinical Support from 12/30/2021 in [facility] Counselor from 10/07/2021 in [facility]  Total GAD-7 Score 9 15 7       PHQ2-9    Flowsheet Row Clinical Support from 03/21/2023 in [facility] Clinical Support from 12/30/2021 in [facility] Counselor from 10/07/2021 in [facility]  PHQ-2 Total Score 0 0 1  PHQ-9 Total Score 5 4 --      Flowsheet Row ED to Hosp-Admission (Discharged) from 11/27/2022 in [facility] 3E HF PCU ED from 06/07/2022 in [facility] Emergency Department at [facility] ED from 03/29/2022 in [facility] Emergency Department at [facility]  C-SSRS RISK CATEGORY No Risk No Risk No Risk        Assessment and Plan: Patient reports that at times she is having increased anxiety and panic attacks related to her health  and her granddaughter.  Patient request that gabapentin be increased.  Provider was agreeable to this.  Today gabapentin 100 mg 3 times daily increased to 300 mg 3 times daily to help manage anxiety.  Patient has not smoked since September 2024.  At this time patient does not wish to continue her Nicorette patches. She will continue her other medications as prescribe  1. Generalized anxiety disorder  Continue- QUEtiapine (SEROQUEL) 300 MG tablet; Take 1 tablet (300 mg total) by mouth at bedtime.  Dispense: 30 tablet; Refill: 3 Continue- QUEtiapine (SEROQUEL) 100 MG tablet; 1 1/2 tabs in the morning  Dispense: 45 tablet; Refill: 3 Increased- gabapentin (NEURONTIN) 300 MG capsule; Take 1 capsule (300 mg total) by mouth 3 (three) times daily.  Dispense: 90 capsule; Refill: 3  2. 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) (Primary)  Continue- QUEtiapine (SEROQUEL) 300 MG tablet; Take 1 tablet (300 mg total) by mouth at bedtime.  Dispense: 30 tablet; Refill: 3 Continue- QUEtiapine (SEROQUEL) 100 MG tablet; 1 1/2 tabs in the morning  Dispense: 45 tablet; Refill: 3 Increased- gabapentin (NEURONTIN) 300 MG capsule; Take 1 capsule (300 mg total) by mouth 3 (three) times daily.  Dispense: 90 capsule; Refill: 3   Follow-up in 2.5 months Follow-up with therapy  Shanna Cisco, NP 03/21/2023, 3:20 PM

## 2023-03-25 NOTE — Telephone Encounter (Signed)
Patients brother will call us back one he checks what dose she is currently on.

## 2023-03-28 NOTE — Telephone Encounter (Signed)
Voicemail left asking patient to return our call ?

## 2023-03-29 NOTE — Telephone Encounter (Signed)
Patients brother confirmed that patient is currently using 14 mg nicotine patches.

## 2023-04-01 ENCOUNTER — Other Ambulatory Visit: Payer: Self-pay

## 2023-04-01 MED ORDER — NICOTINE 14 MG/24HR TD PT24: 14.0000 mg | MEDICATED_PATCH | Freq: Every day | TRANSDERMAL | 0 refills | Status: DC

## 2023-04-01 MED ORDER — NICOTINE 7 MG/24HR TD PT24: 7.0000 mg | MEDICATED_PATCH | Freq: Every day | TRANSDERMAL | 0 refills | Status: DC

## 2023-04-17 ENCOUNTER — Other Ambulatory Visit: Payer: Self-pay | Admitting: Internal Medicine

## 2023-04-18 ENCOUNTER — Telehealth (HOSPITAL_COMMUNITY): Payer: Self-pay | Admitting: Psychiatry

## 2023-04-19 ENCOUNTER — Ambulatory Visit: Payer: Self-pay | Admitting: Internal Medicine

## 2023-04-19 NOTE — Telephone Encounter (Signed)
Provider attempted to call patient without success.  Provider left a voicemail informing patient to have her pharmacy fax over the prior authorization request.  Provider notified nursing staff that the request would be faxed over.  No other concerns at this time.

## 2023-04-21 ENCOUNTER — Ambulatory Visit: Payer: Self-pay | Admitting: Internal Medicine

## 2023-05-04 ENCOUNTER — Inpatient Hospital Stay (HOSPITAL_COMMUNITY)
Admission: RE | Admit: 2023-05-04 | Discharge: 2023-05-04 | Disposition: A | Discharge status: Home / Self Care | Payer: MEDICAID | Source: Ambulatory Visit

## 2023-05-17 ENCOUNTER — Telehealth (HOSPITAL_COMMUNITY): Payer: Self-pay | Admitting: *Deleted

## 2023-05-17 NOTE — Telephone Encounter (Signed)
 Fax received for PA of Seroquel, submitted online with cover my meds. Awaiting decision.

## 2023-05-18 ENCOUNTER — Telehealth (HOSPITAL_COMMUNITY): Payer: Self-pay | Admitting: *Deleted

## 2023-05-18 NOTE — Telephone Encounter (Signed)
 PA for patients quetiapine has been approved till 05/16/24, pharmacy notified.

## 2023-05-26 ENCOUNTER — Ambulatory Visit (HOSPITAL_COMMUNITY)
Admission: RE | Admit: 2023-05-26 | Discharge: 2023-05-26 | Disposition: A | Discharge status: Home / Self Care | Payer: MEDICAID | Source: Ambulatory Visit | Attending: Internal Medicine | Admitting: Internal Medicine

## 2023-05-26 ENCOUNTER — Other Ambulatory Visit: Payer: Self-pay | Admitting: Acute Care

## 2023-05-26 DIAGNOSIS — Z930 Tracheostomy status: Secondary | ICD-10-CM

## 2023-05-26 DIAGNOSIS — J9509 Other tracheostomy complication: Secondary | ICD-10-CM | POA: Diagnosis present

## 2023-05-26 DIAGNOSIS — J4489 Other specified chronic obstructive pulmonary disease: Secondary | ICD-10-CM | POA: Diagnosis not present

## 2023-05-26 DIAGNOSIS — F319 Bipolar disorder, unspecified: Secondary | ICD-10-CM | POA: Diagnosis present

## 2023-05-26 DIAGNOSIS — J381 Polyp of vocal cord and larynx: Secondary | ICD-10-CM | POA: Diagnosis present

## 2023-05-26 NOTE — Progress Notes (Signed)
 Tracheostomy Procedure Note  Meghan Contreras [id number] [birth year]  Pre Procedure Tracheostomy Information  Trach Brand: Shiley Size:  4.0  [id number] Style: Uncuffed Secured by: Velcro   Procedure: Trach Change and Trach Cleaning     Post Procedure Tracheostomy Information  Trach Brand: Shiley Size:  4.0  [id number] Style: Uncuffed Secured by: Velcro   Post Procedure Evaluation:  ETCO2 positive color change from yellow to purple : Yes.   Vital signs:VSS Patients current condition: stable Complications: No apparent complications Trach site exam: clean, dry Wound care done: 4 x 4 gauze Patient did tolerate procedure well.   Education: none  Prescription needs: none    Additional needs: New PMV given to patient at this visit

## 2023-05-26 NOTE — Progress Notes (Cosign Needed Addendum)
 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  Was intubated, initially treated for PNA, extubated and developed post-extubation stridor. ENT was consulted for this and post extubation dysphonia. 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 Went for trach 9/29. Mass biopsied. Was benign. Ultimately discharged 10/8. -Getting her trach supplies from [employer] [phone] -I saw her last in Dec 2024 Presents today for trach f/u   Review of Systems  Constitutional:  Negative for chills and fever.  HENT: Negative.         Soreness around trach stoma   Eyes: Negative.   Respiratory: Negative.    Cardiovascular: Negative.   Gastrointestinal: Negative.   Genitourinary: Negative.   Musculoskeletal: Negative.   Neurological: Negative.   Endo/Heme/Allergies: Negative.     Exam  General 58 year old female who arrives to clinic for planned trach change and in no distress HENT NCAT phonation excellent w/ PMV in place. Does have sig granulation tissue that is red and erythremic surrounding the trach stoma Pulm clear Card rrr Abd soft Ext warm and dry   Procedure  Trach removed. Trach site evaluated new trach placed over obturator w/out difficulty. Placement confirmed via ETCO2    Impression/plan  Active Problems:   Tracheostomy in place [facility])   COPD (chronic obstructive pulmonary disease) with chronic bronchitis (HCC)   Bipolar disorder (HCC)   Laryngeal polyp  Tracheostomy in place [facility]) Overview: Trach size 4 cuffless (last change 10/4 in hospital).  DME [employer] [phone] ENT [place] Last change today 3/27 had been changing entire cannula by herself   Discussion  Would also need to be sure vocal cord mass (although benign) has not returned. Would only consider decannulation in  coordination w/ ENT and certainly not w/out their input  Plan Cont routine trach care ROV 8 weeks for trach change  Cont routine trach care     Granulation tissue of tracheal stoma (HCC) Overview: Fairly significant. Painful to palp but does not appear infected Plan Will change trach a little more freq Encourage her to keep trach ties tighter  If persists will try round of topical  corticosteroids w/ fluocinonide 0.05% ointment bid to site x 7-10 d      My time 16  minutes

## 2023-06-01 ENCOUNTER — Encounter (HOSPITAL_COMMUNITY): Payer: Self-pay | Admitting: Psychiatry

## 2023-06-01 ENCOUNTER — Telehealth (INDEPENDENT_AMBULATORY_CARE_PROVIDER_SITE_OTHER): Payer: MEDICAID | Admitting: Psychiatry

## 2023-06-01 DIAGNOSIS — F411 Generalized anxiety disorder: Secondary | ICD-10-CM

## 2023-06-01 DIAGNOSIS — F3170 Bipolar disorder, currently in remission, most recent episode unspecified: Secondary | ICD-10-CM

## 2023-06-01 MED ORDER — QUETIAPINE FUMARATE 300 MG PO TABS
300.0000 mg | ORAL_TABLET | Freq: Every day | ORAL | 3 refills | Status: DC
Start: 2023-06-01 — End: 2023-08-03

## 2023-06-01 MED ORDER — QUETIAPINE FUMARATE 100 MG PO TABS
ORAL_TABLET | ORAL | 3 refills | Status: DC
Start: 2023-06-01 — End: 2023-08-03

## 2023-06-01 MED ORDER — GABAPENTIN 300 MG PO CAPS
300.0000 mg | ORAL_CAPSULE | Freq: Three times a day (TID) | ORAL | 3 refills | Status: DC
Start: 2023-06-01 — End: 2023-07-18

## 2023-06-01 NOTE — Progress Notes (Signed)
 [facility] MD/PA/NP OP Progress Note   Virtual Visit via Video Note  I connected with Meghan Contreras on 06/01/23 at  9:00 AM EDT by a video enabled telemedicine application and verified that I am speaking with the correct person using two identifiers.  Location: Patient: Home Provider: Clinic   I discussed the limitations of evaluation and management by telemedicine and the availability of in person appointments. The patient expressed understanding and agreed to proceed.  I provided 30 minutes of non-face-to-face time during this encounter.      06/01/2023 9:30 AM Meghan Contreras  MRN:  [id number]  Chief Complaint: "Everything is working"  HPI: 58 year old female seen today for follow up psychiatric evaluation.  She has a psychiatric history of depression, PTSD, bipolar affective disorder, tobacco dependence, and opioid addiction (in remission five years).  Patient is currently prescribed gabapentin 300 mg 3 times daily, Seroquel 300 mg nightly, Seroquel 150 mg daily, and prazosin 4 mg nightly by her PCP. She notes that her medications are effective in managing her psychiatric conditions.   Today she was well-groomed, pleasant, cooperative, and engaged in conversation.  Patient notes that everything is working.  She informed Clinical research associate that since increasing gabapentin she no longer has panic attacks.  Patient reports that she is financially in a better place as her disability has been improved.  She inform her that her mood is stable and notes that she has very minimal anxiety and depression.  Patient informed Clinical research associate that she has been sober from tobacco products for 7 months.  Her breathing continues to get better.   Today provider conducted a GAD-7 and patient scored a 4, at her last visit she scored a 9.  Provider also conducted a PHQ 9 and patient scored a 3, at her last visit she scored an 5.  She endorsed adequate sleep and increased appetite.  Since her last visit she inform her that she has gained 5  pounds.  Today she denies SI/HI/AVH, mania, paranoia.  Patient reports that she is looking forward to this weekend.  She notes that she will be watching her grandchildren so that her daughter and her husband can have a date.  No medication changes made today.  Patient agreeable to continue medications as prescribed.  No other concerns noted at this time.     Visit Diagnosis:    ICD-10-CM   1. Generalized anxiety disorder  F41.1 QUEtiapine (SEROQUEL) 100 MG tablet    gabapentin (NEURONTIN) 300 MG capsule    QUEtiapine (SEROQUEL) 300 MG tablet    2. 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)  F31.70 QUEtiapine (SEROQUEL) 100 MG tablet    gabapentin (NEURONTIN) 300 MG capsule    QUEtiapine (SEROQUEL) 300 MG tablet        Past Psychiatric History: depression, PTSD, bipolar affective disorder, tobacco dependence, and opioid addiction (in remission four years).  Past Medical History:  Past Medical History:  Diagnosis Date   Anemia    Anxiety    Bipolar disorder (HCC)    COPD (chronic obstructive pulmonary disease) (HCC)    Depression    Elevated brain natriuretic peptide (BNP) level    Elevated serum creatinine    HOH (hard of hearing)    Hyperglycemia    Hyperlipidemia 2021   Hyperthyroidism    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  Old records from [facility] with CT of soft tissue of chest and neck 11/2017:  No findings to suggest cause of stridorous breathing and no obvious lung disease.     Tobacco dependence     Past Surgical History:  Procedure Laterality Date   DIRECT LARYNGOSCOPY N/A 11/28/2022   Procedure: DIRECT LARYNGOSCOPY;  Surgeon: Scarlette Ar, MD;  Location: [facility] OR;  Service: ENT;  Laterality: N/A;    ROBOTIC ASSISTED BILATERAL SALPINGO OOPHERECTOMY N/A 01/08/2022   Procedure: XI ROBOTIC ASSISTED BILATERAL SALPINGO OOPHORECTOMY;  Surgeon: Carver Fila, MD;  Location: [facility] ORS;  Service: Gynecology;  Laterality: N/A;   THYROIDECTOMY  2019   TRACHEOSTOMY TUBE PLACEMENT N/A 11/28/2022   Procedure: TRACHEOSTOMY;  Surgeon: Scarlette Ar, MD;  Location: [facility] OR;  Service: ENT;  Laterality: N/A;    Family Psychiatric History: Substance use sister  Family History:  Family History  Problem Relation Age of Onset   HIV/AIDS Mother        from drug abuse   Drug abuse Mother    Drug abuse Brother    Hepatitis C Brother        From IV drug abuse   Drug abuse Sister    Drug abuse Sister    Drug abuse Sister    Drug abuse Sister    Drug abuse Sister    Cancer Sister        lung   Drug abuse Sister     Social History:  Social History   Socioeconomic History   Marital status: Legally Separated    Spouse name: Not on file   Number of children: 2   Years of education: Not on file   Highest education level: Some college, no degree  Occupational History   Occupation: unemployed    Comment: Supported by daughter, ex husband and boyfriend.  Tobacco Use   Smoking status: Every Day    Current packs/day: 0.25    Average packs/day: 0.3 packs/day for 42.0 years (10.5 ttl pk-yrs)    Types: Cigarettes    Passive exposure: Current   Smokeless tobacco: Never   Tobacco comments:    Taking patch off at night and then smokes after 9 p.m.  Vaping Use   Vaping status: Never Used  Substance and Sexual Activity   Alcohol use: No   Drug use: Not Currently    Comment: Benzodiazepines / Klonopin/opiate addiction.  Also history of crack cocaine abuse.  2021 clean for 2017 on Suboxone.    Sexual activity: Not Currently  Other Topics Concern   Not on file  Social History Narrative    Lives alone, though her boyfriend will visit for 2 months at a time.   He lives with his daughter in [place] otherwise.    Social Drivers of Health   Financial Resource Strain: Medium Risk (02/11/2022)   Overall Financial Resource Strain (CARDIA)    Difficulty of Paying Living Expenses: Somewhat hard  Food Insecurity: Patient Unable To Answer (11/27/2022)   Hunger Vital Sign    Worried About Running Out of Food in the Last Year: Patient unable to answer    Ran Out of Food in the Last Year: Patient unable to answer  Transportation Needs: No Transportation Needs (02/11/2022)   PRAPARE - Administrator, Civil Service (Medical): No    Lack of Transportation (Non-Medical):  No  Physical Activity: Not on file  Stress: Not on file  Social Connections: Not on file    Allergies:  Allergies  Allergen Reactions   Amoxicillin Swelling and Rash   Latex Hives and Swelling   Penicillins Other (See Comments)    Facial swelling    Metabolic Disorder Labs: Lab Results  Component Value Date   HGBA1C 5.8 (H) 12/01/2022   MPG 119.76 12/01/2022   MPG 119.76 01/07/2022   No results found for: "PROLACTIN" Lab Results  Component Value Date   CHOL 131 06/22/2022   TRIG 110 11/28/2022   HDL 30 (L) 06/22/2022   LDLCALC 75 06/22/2022   LDLCALC 150 (H) 11/04/2020   Lab Results  Component Value Date   TSH 5.690 (H) 06/22/2022   TSH 8.640 (H) 05/14/2021    Therapeutic Level Labs: No results found for: "LITHIUM" No results found for: "VALPROATE" No results found for: "CBMZ"  Current Medications: Current Outpatient Medications  Medication Sig Dispense Refill   albuterol (VENTOLIN HFA) 108 (90 Base) MCG/ACT inhaler Inhale 2 puffs into the lungs every 6 (six) hours as needed for wheezing or shortness of breath. 18 g 4   Fluticasone-Umeclidin-Vilant (TRELEGY ELLIPTA) 100-62.5-25 MCG/ACT AEPB Inhale 1 puff into the lungs daily. 1 each 11   gabapentin (NEURONTIN) 300 MG capsule Take 1 capsule (300 mg total) by mouth 3 (three) times daily. 90 capsule 3   ibuprofen (ADVIL) 200 MG tablet Take 400-600 mg by  mouth 2 (two) times daily as needed for moderate pain.     iron polysaccharides (NIFEREX) 150 MG capsule Take 1 capsule (150 mg total) by mouth daily. (Patient not taking: Reported on 12/21/2022) 30 capsule 4   levothyroxine (SYNTHROID) 150 MCG tablet Take 1 tablet (150 mcg total) by mouth daily before breakfast. 30 tablet 11   methadone (DOLOPHINE) 10 MG/ML solution Take 55 mg by mouth daily. Verified with RN Mariea Clonts 54 mg     polyethylene glycol (MIRALAX / GLYCOLAX) 17 g packet Take 17 g by mouth daily as needed for mild constipation. 14 each 0   prazosin (MINIPRESS) 2 MG capsule Take 2 capsules (4 mg total) by mouth at bedtime. 60 capsule 3   QUEtiapine (SEROQUEL) 100 MG tablet 1 1/2 tabs in the morning 45 tablet 3   QUEtiapine (SEROQUEL) 300 MG tablet Take 1 tablet (300 mg total) by mouth at bedtime. 30 tablet 3   senna-docusate (SENOKOT-S) 8.6-50 MG tablet Take 1 tablet by mouth 2 (two) times daily. Do not take if having loose stools 60 tablet 0   No current facility-administered medications for this visit.     Musculoskeletal: Strength & Muscle Tone: within normal limits Gait & Station: normal Patient leans: N/A  Psychiatric Specialty Exam: Review of Systems  There were no vitals taken for this visit.There is no height or weight on file to calculate BMI.  General Appearance: Well Groomed  Eye Contact:  Good  Speech:  Clear and Coherent and Normal Rate  Volume:  Normal  Mood:  Euthymic  Affect:  Appropriate and Congruent  Thought Process:  Coherent, Goal Directed, and Linear  Orientation:  Full (Time, Place, and Person)  Thought Content: WDL and Logical   Suicidal Thoughts:  No  Homicidal Thoughts:  No  Memory:  Immediate;   Good Recent;   Good Remote;   Good  Judgement:  Good  Insight:  Good  Psychomotor Activity:  Normal  Concentration:  Concentration: Good and Attention Span: Good  Recall:  Good  Fund of Knowledge: Good  Language: Good  Akathisia:  No  Handed:   Right  AIMS (if indicated): not done  Assets:  Communication Skills Desire for Improvement Financial Resources/Insurance Housing Intimacy Leisure Time Social Support  ADL's:  Intact  Cognition: WNL  Sleep:  Good   Screenings: GAD-7    Flowsheet Row Video Visit from 06/01/2023 in [facility] Clinical Support from 03/21/2023 in [facility] Clinical Support from 12/30/2021 in [facility] Counselor from 10/07/2021 in [facility]  Total GAD-7 Score 4 9 15 7       PHQ2-9    Flowsheet Row Video Visit from 06/01/2023 in [facility] Clinical Support from 03/21/2023 in [facility] Clinical Support from 12/30/2021 in [facility] Counselor from 10/07/2021 in [facility]  PHQ-2 Total Score 0 0 0 1  PHQ-9 Total Score 3 5 4  --      Flowsheet Row ED to Hosp-Admission (Discharged) from 11/27/2022 in [facility] 3E HF PCU ED from 06/07/2022 in [facility] Emergency Department at [facility] ED from 03/29/2022 in [facility] Emergency Department at [facility]  C-SSRS RISK CATEGORY No Risk No Risk No Risk        Assessment and Plan: Patient reports that things are a lot better.  She reports that her anxiety and depression are well-managed.  No medication changes made today.  Patient agreeable to continue medication as prescribed.  1. Generalized anxiety disorder  Continue- QUEtiapine (SEROQUEL) 300 MG tablet; Take 1 tablet (300 mg total) by mouth at bedtime.  Dispense: 30 tablet; Refill: 3 Continue- QUEtiapine (SEROQUEL) 100 MG tablet; 1 1/2 tabs in the morning  Dispense: 45 tablet; Refill: 3 Continue- gabapentin (NEURONTIN) 300 MG capsule; Take 1 capsule (300 mg total) by mouth 3 (three) times daily.  Dispense: 90 capsule; Refill: 3  2. Bipolar  affective disorder in remission (HCC) (Primary)  Continue- QUEtiapine (SEROQUEL) 300 MG tablet; Take 1 tablet (300 mg total) by mouth at bedtime.  Dispense: 30 tablet; Refill: 3 Continue- QUEtiapine (SEROQUEL) 100 MG tablet; 1 1/2 tabs in the morning  Dispense: 45 tablet; Refill: 3 Continue- gabapentin (NEURONTIN) 300 MG capsule; Take 1 capsule (300 mg total) by mouth 3 (three) times daily.  Dispense: 90 capsule; Refill: 3   Follow-up in 2.5 months Follow-up with therapy  Shanna Cisco, NP 06/01/2023, 9:30 AM

## 2023-06-03 ENCOUNTER — Other Ambulatory Visit: Payer: Self-pay | Admitting: Internal Medicine

## 2023-06-23 ENCOUNTER — Ambulatory Visit (HOSPITAL_COMMUNITY)
Admission: RE | Admit: 2023-06-23 | Discharge: 2023-06-23 | Disposition: A | Discharge status: Home / Self Care | Payer: MEDICAID | Source: Ambulatory Visit | Attending: Acute Care | Admitting: Acute Care

## 2023-06-23 DIAGNOSIS — J9509 Other tracheostomy complication: Secondary | ICD-10-CM | POA: Diagnosis present

## 2023-06-23 DIAGNOSIS — Z930 Tracheostomy status: Secondary | ICD-10-CM | POA: Diagnosis not present

## 2023-06-23 NOTE — Progress Notes (Signed)
 Tracheostomy Procedure Note  Meghan Contreras [id number] 1965/04/05  Pre Procedure Tracheostomy Information  Trach Brand: Shiley Size:  4.0 [id number] Style: Uncuffed Secured by: Velcro   Procedure: Trach Cleaning and change    Post Procedure Tracheostomy Information  Trach Brand: Shiley Size:  4.0 [id number] Style: Uncuffed Secured by: Velcro   Post Procedure Evaluation:  ETCO2 positive color change from yellow to purple : Yes.   Vital signs:VSS Patients current condition: stable Complications: No apparent complications Trach site exam: clean, dry Wound care done: 4 x 4 gauze drain Patient did tolerate procedure well.   Education: Red Cap trial instructions  Prescription needs: none    Additional needs: New PMV given to patient at this visit

## 2023-06-23 NOTE — Progress Notes (Signed)
 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  Was intubated, initially treated for PNA, extubated and developed post-extubation stridor. ENT was consulted for this and post extubation dysphonia. 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 Went for trach 9/29. Mass biopsied. Was benign. Ultimately discharged 10/8. -Getting her trach supplies from [employer] [phone] -I saw her last on 3/27 Presents today for trach f/u. On our last visit she did have significant granulation at the trach insertion site. The plan for this was to change the trach more frequently AND keep ties tight. I had wanted to see her at the 4 week margin to ensure this has improved. She presents today as requested   Review of Systems  Constitutional:  Negative for chills and fever.  HENT:         Trach discomfort essentially resolved   Eyes: Negative.   Respiratory: Negative.    Cardiovascular: Negative.   Gastrointestinal: Negative.   Genitourinary: Negative.   Musculoskeletal: Negative.   Skin: Negative.     Exam  General this is a pleasant 58 year old female who presents today for f/u HENT NCAT the #4 trach is midline. Stoma assessment shows the granulation tissue to be less inflamed. No cough or tracheal dc excellent phonation. Placed on occlusive red cap. Does have rush of air w/ removal but tolerates well Pulm clear  Card rrr Abd soft Ext trace LE edema  Neuro intact    Procedure  #4 Trach removed. Trach site evaluated new trach placed over obturator w/out difficulty. Placement confirmed via ETCO2   Tolerated well   Impression/plan  Principal Problem:   Tracheostomy in place [facility]) Active Problems:   Granulation tissue of tracheal stoma (HCC)  Tracheostomy in place [facility]) Overview: Trach size 4  cuffless (last change 3/27). Changed today 4/24 DME [employer] [phone] ENT [place] Last change today 3/27 had been changing entire cannula by herself   Discussion  Still has not seen ENT. Comfortable at rest w/ occlusive cap. If we were to consider decannulation need to be sure vocal cord mass (although benign) has not returned. Would only consider decannulation in coordination w/ ENT and certainly not w/out their input  Plan Cont routine trach care Occlusive red cap during day (as needed and must be removed at night and remove PRN during day when SOB) ROV 4 weeks for trach change  Cont routine trach care     Granulation tissue of tracheal stoma (HCC) Overview: Was Fairly significant. Painful to palp but does not appear infected on last exam. Today better Plan Cont q 4-6 week trach change  Encourage her to keep trach ties tighter  If persists will try round of topical  corticosteroids w/ fluocinonide 0.05% ointment bid to site x 7-10 d (don't need to do this today)      My time 18   minutes

## 2023-07-02 ENCOUNTER — Other Ambulatory Visit: Payer: Self-pay | Admitting: Internal Medicine

## 2023-07-18 ENCOUNTER — Other Ambulatory Visit (HOSPITAL_COMMUNITY): Payer: Self-pay | Admitting: Psychiatry

## 2023-07-18 ENCOUNTER — Telehealth (HOSPITAL_COMMUNITY): Payer: Self-pay

## 2023-07-18 DIAGNOSIS — F411 Generalized anxiety disorder: Secondary | ICD-10-CM

## 2023-07-18 DIAGNOSIS — F3170 Bipolar disorder, currently in remission, most recent episode unspecified: Secondary | ICD-10-CM

## 2023-07-18 MED ORDER — GABAPENTIN 300 MG PO CAPS
300.0000 mg | ORAL_CAPSULE | Freq: Three times a day (TID) | ORAL | 3 refills | Status: DC
Start: 2023-07-18 — End: 2023-08-03

## 2023-07-18 NOTE — Telephone Encounter (Signed)
 Hello,    Pt is asking for a refill of gabapentin .      JNL, CMA

## 2023-07-18 NOTE — Telephone Encounter (Signed)
 Medication sent to preferred pharmacy

## 2023-07-21 ENCOUNTER — Ambulatory Visit (HOSPITAL_COMMUNITY)
Admission: RE | Admit: 2023-07-21 | Discharge: 2023-07-21 | Disposition: A | Discharge status: Home / Self Care | Source: Ambulatory Visit | Attending: Acute Care | Admitting: Acute Care

## 2023-07-21 DIAGNOSIS — J449 Chronic obstructive pulmonary disease, unspecified: Secondary | ICD-10-CM | POA: Diagnosis not present

## 2023-07-21 DIAGNOSIS — Z930 Tracheostomy status: Secondary | ICD-10-CM

## 2023-07-21 DIAGNOSIS — J9509 Other tracheostomy complication: Secondary | ICD-10-CM | POA: Diagnosis present

## 2023-07-21 DIAGNOSIS — J441 Chronic obstructive pulmonary disease with (acute) exacerbation: Secondary | ICD-10-CM | POA: Diagnosis present

## 2023-07-21 NOTE — Progress Notes (Signed)
 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  Was intubated, initially treated for PNA, extubated and developed post-extubation stridor. ENT was consulted for this and post extubation dysphonia. 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 Went for trach 9/29. Mass biopsied. Was benign. Ultimately discharged 10/8. -Getting her trach supplies from [employer] [phone]  Presents today for trach f/u.   Review of Systems  Constitutional:  Negative for weight loss.  HENT: Negative.    Eyes: Negative.   Respiratory: Negative.    Cardiovascular: Negative.   Gastrointestinal: Negative.   Musculoskeletal: Negative.   Skin: Negative.     Exam  58 year old female patient well-known to me presents today for tracheostomy follow-up HEENT normocephalic atraumatic.  Very small noninflamed area of granulation noted on tracheostomy stoma located at approximately 3:00 on clock Dial.  No drainage or discharge.  Phonation quality is at baseline raspy with PMV.  Continues to have fairly significant rush of air following PMV removal Pulmonary clear to auscultation Cardiac regular rate and rhythm Abdomen soft not tender Extremities warm dry brisk cap refill Neuro intact   Procedure  #4 Trach removed. Trach site evaluated new trach placed over obturator w/out difficulty. Placement confirmed via ETCO2   Tolerated well no evidence of infection certainly less inflamed  Impression/plan  Active Problems:   Tracheostomy in place [facility])   COPD with acute exacerbation (HCC)   COPD (chronic obstructive pulmonary disease) (HCC)   Granulation tissue of tracheal stoma (HCC)  Tracheostomy in place [facility]) Overview: Trach size 4 cuffless (last change 3/27). Changed today 5/22 DME [employer] [phone] ENT  [place] Last change today 4/24 had been changing entire cannula by herself   Discussion  She has not seen ENT since tracheostomy initially placed.  With her prior history of vocal cord mass (although benign) as well as evidence of some subglottic stenosis with her marked air trapping noted after trach cuff removal I do not think we can proceed with evaluation for decannulation until she has had a objective exam by ENT to 1 rule out return of her vocal cord mass but also #2 evaluate for subglottic stenosis  plan Cont routine trach care Okay to continue to alternate PMV and occlusive read tracheostomy capping.  I have recommended that she remove tracheostomy During exertion given her degree of air trapping and activity intolerance Also have encouraged increased ambulation with a stepwise plan to increase her activity over the next month We need to get some weight loss first, with improvement in activity tolerance and then I will refer her to ENT Occlusive red cap during day (as needed and must be removed at night and remove PRN during day when SOB) ROV 4 weeks for trach change  Cont routine trach care     Granulation tissue of tracheal stoma (HCC) Overview: Was Fairly significant. Painful to palp but does not appear infected on last exam.  Essentially resolved Plan Cont q 4-6 week trach change  Encourage her to keep trach ties tighter  If persists will try round of topical  corticosteroids w/ fluocinonide 0.05% ointment bid to site x 7-10 d (don't need to do this today)       My time 18   minutes

## 2023-07-21 NOTE — Progress Notes (Addendum)
 Tracheostomy Procedure Note  Arnetra Terris [id number] [birth year]  Pre Procedure Tracheostomy Information  Trach Brand: Shiley Size: 4.0  [id number] Style: Uncuffed Secured by: Velcro   Procedure: Trach Change and Trach Cleaning    Post Procedure Tracheostomy Information  Trach Brand: Shiley Size: 4.0  [id number] Style: Uncuffed Secured by: Velcro   Post Procedure Evaluation:  ETCO2 positive color change from yellow to purple : Yes.   Vital signs:VSS Patients current condition: stable Complications: No apparent complications Trach site exam: clean, dry Wound care done: 4 x 4 gauze  drain Patient did tolerate procedure well.   Education: none  Prescription needs: none    Additional needs: New PMV given to patient at this visit

## 2023-07-27 ENCOUNTER — Other Ambulatory Visit: Payer: Self-pay | Admitting: Internal Medicine

## 2023-08-03 ENCOUNTER — Encounter (HOSPITAL_COMMUNITY): Payer: Self-pay | Admitting: Psychiatry

## 2023-08-03 ENCOUNTER — Telehealth (INDEPENDENT_AMBULATORY_CARE_PROVIDER_SITE_OTHER): Payer: MEDICAID | Admitting: Psychiatry

## 2023-08-03 DIAGNOSIS — F411 Generalized anxiety disorder: Secondary | ICD-10-CM | POA: Diagnosis not present

## 2023-08-03 DIAGNOSIS — F3170 Bipolar disorder, currently in remission, most recent episode unspecified: Secondary | ICD-10-CM

## 2023-08-03 MED ORDER — GABAPENTIN 300 MG PO CAPS
300.0000 mg | ORAL_CAPSULE | Freq: Three times a day (TID) | ORAL | 3 refills | Status: DC
Start: 2023-08-03 — End: 2023-10-13

## 2023-08-03 MED ORDER — QUETIAPINE FUMARATE 100 MG PO TABS: ORAL_TABLET | ORAL | 3 refills | Status: DC

## 2023-08-03 MED ORDER — QUETIAPINE FUMARATE 300 MG PO TABS
300.0000 mg | ORAL_TABLET | Freq: Every day | ORAL | 3 refills | Status: DC
Start: 2023-08-03 — End: 2023-10-13

## 2023-08-03 MED ORDER — PRAZOSIN HCL 2 MG PO CAPS: 4.0000 mg | ORAL_CAPSULE | Freq: Every day | ORAL | 3 refills | Status: DC

## 2023-08-03 NOTE — Progress Notes (Signed)
 [facility] MD/PA/NP OP Progress Note   Virtual Visit via Video Note  I connected with Meghan Contreras on 08/03/23 at  9:00 AM EDT by a video enabled telemedicine application and verified that I am speaking with the correct person using two identifiers.  Location: Patient: Home Provider: Clinic   I discussed the limitations of evaluation and management by telemedicine and the availability of in person appointments. The patient expressed understanding and agreed to proceed.  I provided 30 minutes of non-face-to-face time during this encounter.      08/03/2023 9:32 AM Meghan Contreras  MRN:  [id number]  Chief Complaint: "Everything is good  HPI: 58 year old female seen today for follow up psychiatric evaluation.  She has a psychiatric history of depression, PTSD, bipolar affective disorder, tobacco dependence, and opioid addiction (in remission five years).  Patient is currently prescribed gabapentin  300 mg 3 times daily, Seroquel  300 mg nightly, Seroquel  150 mg daily, and prazosin  4 mg nightly by her PCP. She notes that her medications are effective in managing her psychiatric conditions.   Today she was well-groomed, pleasant, cooperative, and engaged in conversation.  Patient notes that everything is going okay.  She does note that she has some stressors. She reports that her brother passed away yesterday due to substance use. At times she notes that she gets anxious when things don't go her way. Despite theses stressors she notes that she is able to cope. She notes that she is happy because 3 of her 5 grandchildren graduated.  Overall patient notes that mentally she is in a good place. Today she inform her that her mood is stable and notes that she has very minimal anxiety and depression. Today provider conducted a GAD-7 and patient scored a 5, at her last visit she scored a 4.  Provider also conducted a PHQ 9 and patient scored a 7, at her last visit she scored an 3.  She endorsed adequate sleep and   appetite.   She notes that she has been walking more. Patient reports that she continues to be sober from tobacco. She informed Clinical research associate that she gained 25 pounds since discontinuing the substance. Since her last visit she has lost 5 pounds. Today she denies SI/HI/AVH, mania, paranoia.  No medication changes made today.  Patient agreeable to continue medications as prescribed.  No other concerns noted at this time.     Visit Diagnosis:    ICD-10-CM   1. Generalized anxiety disorder  F41.1 QUEtiapine  (SEROQUEL ) 300 MG tablet    QUEtiapine  (SEROQUEL ) 100 MG tablet    gabapentin  (NEURONTIN ) 300 MG capsule    2. 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)  F31.70 QUEtiapine  (SEROQUEL ) 300 MG tablet    QUEtiapine  (SEROQUEL ) 100 MG tablet    gabapentin  (NEURONTIN ) 300 MG capsule         Past Psychiatric History: depression, PTSD, bipolar affective disorder, tobacco dependence, and opioid addiction (in remission four years).  Past Medical History:  Past Medical History:  Diagnosis Date   Anemia    Anxiety    Bipolar disorder (HCC)    COPD (chronic obstructive pulmonary disease) (HCC)    Depression    Elevated brain natriuretic peptide (BNP) level    Elevated serum creatinine    HOH (hard of hearing)    Hyperglycemia    Hyperlipidemia 2021   Hyperthyroidism    Left Ovarian torsion 01/06/2022   with large fibroma   Opiate addiction (HCC)    On chronic suboxone  since 2017 this last  go around.     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  Old records from [facility] with CT of soft tissue of chest and neck 11/2017:  No findings to suggest cause of stridorous breathing and no obvious lung disease.     Tobacco dependence     Past Surgical History:  Procedure Laterality Date   DIRECT LARYNGOSCOPY N/A 11/28/2022   Procedure: DIRECT LARYNGOSCOPY;  Surgeon: Rush Coupe,  MD;  [facility] OR;  Service: ENT;  Laterality: N/A;   ROBOTIC ASSISTED BILATERAL SALPINGO OOPHERECTOMY N/A 01/08/2022   Procedure: XI ROBOTIC ASSISTED BILATERAL SALPINGO OOPHORECTOMY;  Surgeon: Suzi Essex, MD;  Location: [facility] ORS;  Service: Gynecology;  Laterality: N/A;   THYROIDECTOMY  2019   TRACHEOSTOMY TUBE PLACEMENT N/A 11/28/2022   Procedure: TRACHEOSTOMY;  Surgeon: Rush Coupe, MD;  Location: [facility] OR;  Service: ENT;  Laterality: N/A;    Family Psychiatric History: Substance use sister  Family History:  Family History  Problem Relation Age of Onset   HIV/AIDS Mother        from drug abuse   Drug abuse Mother    Drug abuse Brother    Hepatitis C Brother        From IV drug abuse   Drug abuse Sister    Drug abuse Sister    Drug abuse Sister    Drug abuse Sister    Drug abuse Sister    Cancer Sister        lung   Drug abuse Sister     Social History:  Social History   Socioeconomic History   Marital status: Legally Separated    Spouse name: Not on file   Number of children: 2   Years of education: Not on file   Highest education level: Some college, no degree  Occupational History   Occupation: unemployed    Comment: Supported by daughter, ex husband and boyfriend.  Tobacco Use   Smoking status: Every Day    Current packs/day: 0.25    Average packs/day: 0.3 packs/day for 42.0 years (10.5 ttl pk-yrs)    Types: Cigarettes    Passive exposure: Current   Smokeless tobacco: Never   Tobacco comments:    Taking patch off at night and then smokes after 9 p.m.  Vaping Use   Vaping status: Never Used  Substance and Sexual Activity   Alcohol use: No   Drug use: Not Currently    Comment: Benzodiazepines / Klonopin/opiate addiction.  Also history of crack cocaine abuse.  2021 clean for 2017 on Suboxone .    Sexual activity: Not Currently  Other Topics Concern   Not on file  Social History Narrative    Lives alone, though her boyfriend will visit for 2 months  at a time.   He lives with his daughter in [place] otherwise.   Social Drivers of Health   Financial Resource Strain: Medium Risk (02/11/2022)   Overall Financial Resource Strain (CARDIA)    Difficulty of Paying Living Expenses: Somewhat hard  Food Insecurity: Patient Unable To Answer (11/27/2022)   Hunger Vital Sign    Worried About Running Out of Food in the Last Year: Patient unable to answer    Ran Out of Food in the Last Year: Patient unable to answer  Transportation Needs: No Transportation Needs (02/11/2022)   PRAPARE - Transportation  Lack of Transportation (Medical): No    Lack of Transportation (Non-Medical): No  Physical Activity: Not on file  Stress: Not on file  Social Connections: Not on file    Allergies:  Allergies  Allergen Reactions   Amoxicillin Swelling and Rash   Latex Hives and Swelling   Penicillins Other (See Comments)    Facial swelling    Metabolic Disorder Labs: Lab Results  Component Value Date   HGBA1C 5.8 (H) 12/01/2022   MPG 119.76 12/01/2022   MPG 119.76 01/07/2022   No results found for: "PROLACTIN" Lab Results  Component Value Date   CHOL 131 06/22/2022   TRIG 110 11/28/2022   HDL 30 (L) 06/22/2022   LDLCALC 75 06/22/2022   LDLCALC 150 (H) 11/04/2020   Lab Results  Component Value Date   TSH 5.690 (H) 06/22/2022   TSH 8.640 (H) 05/14/2021    Therapeutic Level Labs: No results found for: "LITHIUM" No results found for: "VALPROATE" No results found for: "CBMZ"  Current Medications: Current Outpatient Medications  Medication Sig Dispense Refill   albuterol  (VENTOLIN  HFA) 108 (90 Base) MCG/ACT inhaler Inhale 2 puffs into the lungs every 6 (six) hours as needed for wheezing or shortness of breath. 18 g 4   Fluticasone -Umeclidin-Vilant (TRELEGY ELLIPTA ) 100-62.5-25 MCG/ACT AEPB Inhale 1 puff into the lungs daily. 1 each 11   gabapentin  (NEURONTIN ) 300 MG capsule Take 1 capsule (300 mg total) by mouth 3 (three) times daily. 90  capsule 3   ibuprofen (ADVIL) 200 MG tablet Take 400-600 mg by mouth 2 (two) times daily as needed for moderate pain.     iron  polysaccharides (NIFEREX) 150 MG capsule Take 1 capsule (150 mg total) by mouth daily. (Patient not taking: Reported on 12/21/2022) 30 capsule 4   levothyroxine  (SYNTHROID ) 150 MCG tablet TAKE 1 TABLET BY MOUTH DAILY BEFORE BREAKFAST. 30 tablet 11   methadone  (DOLOPHINE ) 10 MG/ML solution Take 55 mg by mouth daily. Verified with RN Sharman Debar 54 mg     polyethylene glycol (MIRALAX  / GLYCOLAX ) 17 g packet Take 17 g by mouth daily as needed for mild constipation. 14 each 0   prazosin  (MINIPRESS ) 2 MG capsule Take 2 capsules (4 mg total) by mouth at bedtime. 60 capsule 3   QUEtiapine  (SEROQUEL ) 100 MG tablet 1 1/2 tabs in the morning 45 tablet 3   QUEtiapine  (SEROQUEL ) 300 MG tablet Take 1 tablet (300 mg total) by mouth at bedtime. 30 tablet 3   Contreras-docusate (SENOKOT-S) 8.6-50 MG tablet Take 1 tablet by mouth 2 (two) times daily. Do not take if having loose stools 60 tablet 0   No current facility-administered medications for this visit.     Musculoskeletal: Strength & Muscle Tone: within normal limits Gait & Station: normal Patient leans: N/A  Psychiatric Specialty Exam: Review of Systems  There were no vitals taken for this visit.There is no height or weight on file to calculate BMI.  General Appearance: Well Groomed  Eye Contact:  Good  Speech:  Clear and Coherent and Normal Rate  Volume:  Normal  Mood:  Euthymic  Affect:  Appropriate and Congruent  Thought Process:  Coherent, Goal Directed, and Linear  Orientation:  Full (Time, Place, and Person)  Thought Content: WDL and Logical   Suicidal Thoughts:  No  Homicidal Thoughts:  No  Memory:  Immediate;   Good Recent;   Good Remote;   Good  Judgement:  Good  Insight:  Good  Psychomotor Activity:  Normal  Concentration:  Concentration: Good and Attention Span: Good  Recall:  Good  Fund of Knowledge:  Good  Language: Good  Akathisia:  No  Handed:  Right  AIMS (if indicated): not done  Assets:  Communication Skills Desire for Improvement Financial Resources/Insurance Housing Intimacy Leisure Time Social Support  ADL's:  Intact  Cognition: WNL  Sleep:  Good   Screenings: GAD-7    Flowsheet Row Video Visit from 08/03/2023 in [facility] Video Visit from 06/01/2023 in [facility] Clinical Support from 03/21/2023 in [facility] Clinical Support from 12/30/2021 in [facility] from 10/07/2021 in [facility]  Total GAD-7 Score 5 4 9 15 7       PHQ2-9    Flowsheet Row Video Visit from 08/03/2023 in [facility] Video Visit from 06/01/2023 in [facility] Clinical Support from 03/21/2023 in [facility] Clinical Support from 12/30/2021 in [facility] Counselor from 10/07/2021 in [facility]  PHQ-2 Total Score 0 0 0 0 1  PHQ-9 Total Score 7 3 5 4  --      Flowsheet Row ED to Hosp-Admission (Discharged) from 11/27/2022 in [facility] 3E HF PCU ED from 06/07/2022 in [facility] Emergency Department at [facility] ED from 03/29/2022 in [facility] Emergency Department at [facility]  C-SSRS RISK CATEGORY No Risk No Risk No Risk        Assessment and Plan: Patient reports that she has life stressors but is able to cope. Her mood, anxiety, and depression are well managed. No medication changes made today.  Patient agreeable to continue medication as prescribed.  1. Generalized anxiety disorder  Continue- QUEtiapine  (SEROQUEL ) 300 MG tablet; Take 1 tablet (300 mg total) by mouth at bedtime.  Dispense: 30 tablet; Refill: 3 Continue- QUEtiapine  (SEROQUEL ) 100 MG tablet; 1 1/2 tabs in the  morning  Dispense: 45 tablet; Refill: 3 Continue- gabapentin  (NEURONTIN ) 300 MG capsule; Take 1 capsule (300 mg total) by mouth 3 (three) times daily.  Dispense: 90 capsule; Refill: 3  2. 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) (Primary)  Continue- QUEtiapine  (SEROQUEL ) 300 MG tablet; Take 1 tablet (300 mg total) by mouth at bedtime.  Dispense: 30 tablet; Refill: 3 Continue- QUEtiapine  (SEROQUEL ) 100 MG tablet; 1 1/2 tabs in the morning  Dispense: 45 tablet; Refill: 3 Continue- gabapentin  (NEURONTIN ) 300 MG capsule; Take 1 capsule (300 mg total) by mouth 3 (three) times daily.  Dispense: 90 capsule; Refill: 3   Follow-up in 2.5 months Follow-up with therapy  Arlyne Bering, NP 08/03/2023, 9:32 AM

## 2023-08-09 ENCOUNTER — Ambulatory Visit: Payer: Self-pay | Admitting: Internal Medicine

## 2023-08-21 ENCOUNTER — Other Ambulatory Visit: Payer: Self-pay | Admitting: Internal Medicine

## 2023-08-22 ENCOUNTER — Inpatient Hospital Stay (HOSPITAL_COMMUNITY): Admission: RE | Admit: 2023-08-22 | Source: Ambulatory Visit

## 2023-09-15 ENCOUNTER — Inpatient Hospital Stay (HOSPITAL_COMMUNITY): Admission: RE | Admit: 2023-09-15 | Source: Ambulatory Visit

## 2023-09-15 NOTE — Progress Notes (Incomplete)
 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  Was intubated, initially treated for PNA, extubated and developed post-extubation stridor. ENT was consulted for this and post extubation dysphonia. 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 Went for trach 9/29. Mass biopsied. Was benign. Ultimately discharged 10/8. -Getting her trach supplies from [employer] [phone]  Presents today for trach f/u.   ROS  Exam     Procedure  #4 Trach removed. Trach site evaluated new trach placed over obturator w/out difficulty. Placement confirmed via ETCO2   Tolerated well no evidence of infection certainly less inflamed  Impression/plan  Active Problems:   Tracheostomy in place [facility])   COPD (chronic obstructive pulmonary disease) (HCC)   Chronic hypoxic respiratory failure (HCC)  Tracheostomy in place [facility]) Overview: Trach size 4 cuffless (last change 3/27). Changed today 5/22 DME [employer] [phone] ENT [place] Last change today 5/22 had been changing entire cannula by herself   Discussion  She has not seen ENT since tracheostomy initially placed.  With her prior history of vocal cord mass (although benign) as well as evidence of some subglottic stenosis with her marked air trapping noted after trach cuff removal I do not think we can proceed with evaluation for decannulation until she has had a objective exam by ENT to #1 rule out return of her vocal cord mass but also #2 evaluate for subglottic stenosis. Last time I saw her our goal was to get her physical conditioning better and also get some weight off before we refer to ENT  plan Cont routine trach care Okay to continue to alternate PMV and occlusive read tracheostomy capping.  I have recommended that she remove PMV During exertion  given her degree of air trapping and activity intolerance Also have encouraged increased ambulation with a stepwise plan to increase her activity over the next month Occlusive red cap during day (as needed and must be removed at night and remove PRN during day when SOB) ROV 4 weeks for trach change  Cont routine trach care

## 2023-09-21 ENCOUNTER — Ambulatory Visit (HOSPITAL_COMMUNITY)
Admission: RE | Admit: 2023-09-21 | Discharge: 2023-09-21 | Disposition: A | Discharge status: Home / Self Care | Source: Ambulatory Visit | Attending: Internal Medicine | Admitting: Internal Medicine

## 2023-09-21 DIAGNOSIS — Z930 Tracheostomy status: Secondary | ICD-10-CM | POA: Diagnosis present

## 2023-09-21 NOTE — Progress Notes (Signed)
 Tracheostomy Procedure Note  Meghan Contreras [id number] 02-Feb-1966  Pre Procedure Tracheostomy Information  Trach Brand: Shiley Size: #4 [id number] Style: Uncuffed Secured by: Velcro   Procedure: Trach care/change/education.    Post Procedure Tracheostomy Information  Trach Brand: Shiley Size: #4 [id number] Style: Uncuffed Secured by: Velcro   Post Procedure Evaluation:  ETCO2 positive color change from yellow to purple : Yes.   Vital signs: stable throughout. Patients current condition: stable Complications: No apparent complications Trach site exam: clean, dry, no drainage Wound care done: dry, sterile, clean, and 4 x 4 gauze Patient did tolerate procedure well.   Education: Given by Jenna, CCM-NP  Prescription needs: None    Additional needs: Trach supplies and new PMV given.

## 2023-10-02 ENCOUNTER — Other Ambulatory Visit: Payer: Self-pay | Admitting: Internal Medicine

## 2023-10-07 ENCOUNTER — Telehealth (HOSPITAL_COMMUNITY): Admitting: Psychiatry

## 2023-10-13 ENCOUNTER — Encounter (HOSPITAL_COMMUNITY): Payer: Self-pay | Admitting: Family

## 2023-10-13 ENCOUNTER — Telehealth (INDEPENDENT_AMBULATORY_CARE_PROVIDER_SITE_OTHER): Admitting: Family

## 2023-10-13 DIAGNOSIS — F4310 Post-traumatic stress disorder, unspecified: Secondary | ICD-10-CM

## 2023-10-13 DIAGNOSIS — F17200 Nicotine dependence, unspecified, uncomplicated: Secondary | ICD-10-CM

## 2023-10-13 DIAGNOSIS — F411 Generalized anxiety disorder: Secondary | ICD-10-CM | POA: Diagnosis not present

## 2023-10-13 DIAGNOSIS — F3170 Bipolar disorder, currently in remission, most recent episode unspecified: Secondary | ICD-10-CM | POA: Diagnosis not present

## 2023-10-13 MED ORDER — GABAPENTIN 300 MG PO CAPS
300.0000 mg | ORAL_CAPSULE | Freq: Three times a day (TID) | ORAL | 3 refills | Status: DC
Start: 2023-10-13 — End: 2024-01-12

## 2023-10-13 MED ORDER — PRAZOSIN HCL 2 MG PO CAPS: 4.0000 mg | ORAL_CAPSULE | Freq: Every day | ORAL | 3 refills | Status: DC

## 2023-10-13 MED ORDER — QUETIAPINE FUMARATE 300 MG PO TABS
300.0000 mg | ORAL_TABLET | Freq: Every day | ORAL | 3 refills | Status: DC
Start: 2023-10-13 — End: 2023-12-27

## 2023-10-13 MED ORDER — QUETIAPINE FUMARATE 100 MG PO TABS
ORAL_TABLET | ORAL | 3 refills | Status: DC
Start: 2023-10-13 — End: 2023-12-27

## 2023-10-13 NOTE — Progress Notes (Signed)
 [facility] MD/PA/NP OP Progress Note   Virtual Visit via Video Note  I connected with Meghan Contreras on 10/13/23 at  9:00 AM EDT by a video enabled telemedicine application and verified that I am speaking with the correct person using two identifiers.  Location: Patient: Home Provider: Clinic   I discussed the limitations of evaluation and management by telemedicine and the availability of in person appointments. The patient expressed understanding and agreed to proceed.  I provided 30 minutes of non-face-to-face time during this encounter.    10/13/2023 9:08 AM [name]  MRN:  [id number]  Chief Complaint: Everything is good  HPI: 58 year old female seen today for follow up psychiatric evaluation.  She has a psychiatric history of depression, PTSD, bipolar affective disorder, tobacco dependence, and opioid addiction (in remission five years).  Patient is currently prescribed gabapentin  300 mg 3 times daily, Seroquel  300 mg nightly, Seroquel  150 mg daily, and prazosin  4 mg nightly by her PCP. She notes that her medications are effective in managing her psychiatric conditions.   Patient presents well-groomed, pleasant, cooperative, and engaged in conversation. She reports that overall, things are going okay, though she is experiencing some stressors. She shares that her brother passed away yesterday due to substance use. At times, she feels anxious when things do not go her way but states she is able to cope. She expresses happiness that three of her five grandchildren recently graduated. She describes herself as being in a good mental place, with stable mood, very minimal anxiety, and depression.  Today's GAD-7 score is 5 (previously 4), and PHQ-9 score is 7 (previously 3). She endorses adequate sleep and appetite, noting she has been walking more. She remains sober from tobacco and reports gaining 25 pounds since quitting, though she has lost 5 pounds since her last visit. She denies suicidal  ideation, homicidal ideation, auditory or visual hallucinations, mania, or paranoia.  She adds that some days are better than others and that she does not get out much due to having a tracheostomy, which she has had for about one year. She reports significant anxiety surrounding the trach. She underwent surgery for what was thought to be a possible lung mass and collapsed lung. Since then, she has remained nicotine -free for the past year. Sleep and appetite remain adequate.  Objective: Patient is alert and oriented to person, place, time, and situation. Well-groomed and appropriately dressed. Maintains good eye contact and is cooperative throughout the interview. Thought process is logical and goal-directed. Mood appears euthymic with congruent affect. Speech is clear, coherent, and of normal rate and volume. No abnormal movements or psychomotor agitation/retardation noted. No perceptual disturbances observed or reported. Insight and judgment appear intact. Visit Diagnosis:    ICD-10-CM   1. Generalized anxiety disorder  F41.1     2. 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)  F31.70     3. Tobacco dependence  F17.200     4. PTSD (post-traumatic stress disorder)  F43.10        Past Psychiatric History: depression, PTSD, bipolar affective disorder, tobacco dependence, and opioid addiction (in remission four years).  Past Medical History:  Past Medical History:  Diagnosis Date   Anemia    Anxiety    Bipolar disorder (HCC)    COPD (chronic obstructive pulmonary disease) (HCC)    Depression    Elevated brain natriuretic peptide (BNP) level    Elevated serum creatinine    HOH (hard of hearing)    Hyperglycemia    Hyperlipidemia 2021  Hyperthyroidism    Left Ovarian torsion 01/06/2022   with large fibroma   Opiate addiction (HCC)    On chronic suboxone  since 2017 this last go around.     Pelvic mass    Peripheral edema    Pre-diabetes    PTSD (post-traumatic stress disorder)     Stridor 2019   Developed after total thyroidectomy.  Old records from [facility] with CT of soft tissue of chest and neck 11/2017:  No findings to suggest cause of stridorous breathing and no obvious lung disease.     Tobacco dependence     Past Surgical History:  Procedure Laterality Date   DIRECT LARYNGOSCOPY N/A 11/28/2022   Procedure: DIRECT LARYNGOSCOPY;  Surgeon: Luciano Standing, MD;  Location: [facility] OR;  Service: ENT;  Laterality: N/A;   ROBOTIC ASSISTED BILATERAL SALPINGO OOPHERECTOMY N/A 01/08/2022   Procedure: XI ROBOTIC ASSISTED BILATERAL SALPINGO OOPHORECTOMY;  Surgeon: Viktoria Comer SAUNDERS, MD;  Location: [facility] ORS;  Service: Gynecology;  Laterality: N/A;   THYROIDECTOMY  2019   TRACHEOSTOMY TUBE PLACEMENT N/A 11/28/2022   Procedure: TRACHEOSTOMY;  Surgeon: Luciano Standing, MD;  Location: [facility] OR;  Service: ENT;  Laterality: N/A;    Family Psychiatric History: Substance use sister  Family History:  Family History  Problem Relation Age of Onset   HIV/AIDS Mother        from drug abuse   Drug abuse Mother    Drug abuse Brother    Hepatitis C Brother        From IV drug abuse   Drug abuse Sister    Drug abuse Sister    Drug abuse Sister    Drug abuse Sister    Drug abuse Sister    Cancer Sister        lung   Drug abuse Sister     Social History:  Social History   Socioeconomic History   Marital status: Legally Separated    Spouse name: Not on file   Number of children: 2   Years of education: Not on file   Highest education level: Some college, no degree  Occupational History   Occupation: unemployed    Comment: Supported by daughter, ex husband and boyfriend.  Tobacco Use   Smoking status: Every Day    Current packs/day: 0.25    Average packs/day: 0.3 packs/day for 42.0 years (10.5 ttl pk-yrs)    Types: Cigarettes    Passive exposure: Current   Smokeless tobacco: Never   Tobacco comments:    Taking patch off at night and then smokes after 9 p.m.   Vaping Use   Vaping status: Never Used  Substance and Sexual Activity   Alcohol use: No   Drug use: Not Currently    Comment: Benzodiazepines / Klonopin/opiate addiction.  Also history of crack cocaine abuse.  2021 clean for 2017 on Suboxone .    Sexual activity: Not Currently  Other Topics Concern   Not on file  Social History Narrative    Lives alone, though her boyfriend will visit for 2 months at a time.   He lives with his daughter in [place] otherwise.   Social Drivers of Health   Financial Resource Strain: Medium Risk (02/11/2022)   Overall Financial Resource Strain (CARDIA)    Difficulty of Paying Living Expenses: Somewhat hard  Food Insecurity: Patient Unable To Answer (11/27/2022)   Hunger Vital Sign    Worried About Running Out of Food in the Last Year: Patient unable to answer  Ran Out of Food in the Last Year: Patient unable to answer  Transportation Needs: No Transportation Needs (02/11/2022)   PRAPARE - Administrator, Civil Service (Medical): No    Lack of Transportation (Non-Medical): No  Physical Activity: Not on file  Stress: Not on file  Social Connections: Not on file    Allergies:  Allergies  Allergen Reactions   Amoxicillin Swelling and Rash   Latex Hives and Swelling   Penicillins Other (See Comments)    Facial swelling    Metabolic Disorder Labs: Lab Results  Component Value Date   HGBA1C 5.8 (H) 12/01/2022   MPG 119.76 12/01/2022   MPG 119.76 01/07/2022   No results found for: PROLACTIN Lab Results  Component Value Date   CHOL 131 06/22/2022   TRIG 110 11/28/2022   HDL 30 (L) 06/22/2022   LDLCALC 75 06/22/2022   LDLCALC 150 (H) 11/04/2020   Lab Results  Component Value Date   TSH 5.690 (H) 06/22/2022   TSH 8.640 (H) 05/14/2021    Therapeutic Level Labs: No results found for: LITHIUM No results found for: VALPROATE No results found for: CBMZ  Current Medications: Current Outpatient Medications  Medication  Sig Dispense Refill   albuterol  (VENTOLIN  HFA) 108 (90 Base) MCG/ACT inhaler Inhale 2 puffs into the lungs every 6 (six) hours as needed for wheezing or shortness of breath. 18 g 4   Fluticasone -Umeclidin-Vilant (TRELEGY ELLIPTA ) 100-62.5-25 MCG/ACT AEPB Inhale 1 puff into the lungs daily. 1 each 11   gabapentin  (NEURONTIN ) 300 MG capsule Take 1 capsule (300 mg total) by mouth 3 (three) times daily. 90 capsule 3   ibuprofen (ADVIL) 200 MG tablet Take 400-600 mg by mouth 2 (two) times daily as needed for moderate pain.     iron  polysaccharides (NIFEREX) 150 MG capsule Take 1 capsule (150 mg total) by mouth daily. (Patient not taking: Reported on 12/21/2022) 30 capsule 4   levothyroxine  (SYNTHROID ) 150 MCG tablet TAKE 1 TABLET BY MOUTH DAILY BEFORE BREAKFAST. 30 tablet 11   methadone  (DOLOPHINE ) 10 MG/ML solution Take 55 mg by mouth daily. Verified with RN Monta Rase 54 mg     polyethylene glycol (MIRALAX  / GLYCOLAX ) 17 g packet Take 17 g by mouth daily as needed for mild constipation. 14 each 0   prazosin  (MINIPRESS ) 2 MG capsule Take 2 capsules (4 mg total) by mouth at bedtime. 60 capsule 3   QUEtiapine  (SEROQUEL ) 100 MG tablet 1 1/2 tabs in the morning 45 tablet 3   QUEtiapine  (SEROQUEL ) 300 MG tablet Take 1 tablet (300 mg total) by mouth at bedtime. 30 tablet 3   senna-docusate (SENOKOT-S) 8.6-50 MG tablet Take 1 tablet by mouth 2 (two) times daily. Do not take if having loose stools 60 tablet 0   No current facility-administered medications for this visit.     Musculoskeletal: Strength & Muscle Tone: within normal limits Gait & Station: normal Patient leans: N/A  Psychiatric Specialty Exam: Review of Systems  Psychiatric/Behavioral: Negative.    All other systems reviewed and are negative.   There were no vitals taken for this visit.There is no height or weight on file to calculate BMI.  General Appearance: Well Groomed  Eye Contact:  Good  Speech:  Clear and Coherent and Normal  Rate  Volume:  Normal  Mood:  Euthymic  Affect:  Appropriate and Congruent  Thought Process:  Coherent, Goal Directed, and Linear  Orientation:  Full (Time, Place, and Person)  Thought Content: WDL and  Logical   Suicidal Thoughts:  No  Homicidal Thoughts:  No  Memory:  Immediate;   Good Recent;   Good Remote;   Good  Judgement:  Good  Insight:  Good  Psychomotor Activity:  Normal  Concentration:  Concentration: Good and Attention Span: Good  Recall:  Good  Fund of Knowledge: Good  Language: Good  Akathisia:  No  Handed:  Right  AIMS (if indicated): not done  Assets:  Communication Skills Desire for Improvement Financial Resources/Insurance Housing Intimacy Leisure Time Social Support  ADL's:  Intact  Cognition: WNL  Sleep:  Good   Screenings: GAD-7    Flowsheet Row Video Visit from 08/03/2023 in [facility] Video Visit from 06/01/2023 in [facility] Clinical Support from 03/21/2023 in [facility] Clinical Support from 12/30/2021 in [facility] Counselor from 10/07/2021 in [facility]  Total GAD-7 Score 5 4 9 15 7    PHQ2-9    Flowsheet Row Video Visit from 08/03/2023 in [facility] Video Visit from 06/01/2023 in [facility] Clinical Support from 03/21/2023 in [facility] Clinical Support from 12/30/2021 in [facility] Counselor from 10/07/2021 in [facility]  PHQ-2 Total Score 0 0 0 0 1  PHQ-9 Total Score 7 3 5 4  --   Flowsheet Row ED to Hosp-Admission (Discharged) from 11/27/2022 in [facility] 3E HF PCU ED from 06/07/2022 in [facility] Emergency Department at [facility] ED from 03/29/2022 in [facility] Emergency Department at [facility]  C-SSRS RISK CATEGORY No Risk No Risk  No Risk     Assessment and Plan: Patient reports that she has life stressors but is able to cope. Her mood, anxiety, and depression are well managed. No medication changes made today.  Patient agreeable to continue medication as prescribed.  1. Generalized anxiety disorder  Continue- QUEtiapine  (SEROQUEL ) 300 MG tablet; Take 1 tablet (300 mg total) by mouth at bedtime.  Dispense: 30 tablet; Refill: 3 Continue- QUEtiapine  (SEROQUEL ) 100 MG tablet; 1 1/2 tabs in the morning  Dispense: 45 tablet; Refill: 3 Continue- gabapentin  (NEURONTIN ) 300 MG capsule; Take 1 capsule (300 mg total) by mouth 3 (three) times daily.  Dispense: 90 capsule; Refill: 3  2. 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) (Primary)  Continue- QUEtiapine  (SEROQUEL ) 300 MG tablet; Take 1 tablet (300 mg total) by mouth at bedtime.  Dispense: 30 tablet; Refill: 3 Continue- QUEtiapine  (SEROQUEL ) 100 MG tablet; 1 1/2 tabs in the morning  Dispense: 45 tablet; Refill: 3 Continue- gabapentin  (NEURONTIN ) 300 MG capsule; Take 1 capsule (300 mg total) by mouth 3 (three) times daily.  Dispense: 90 capsule; Refill: 3   Follow-up in 2.5 months Follow-up with therapy  Majel GORMAN Ramp, FNP 10/13/2023, 9:08 AM

## 2023-11-08 ENCOUNTER — Encounter: Payer: Self-pay | Admitting: Internal Medicine

## 2023-11-08 ENCOUNTER — Ambulatory Visit (INDEPENDENT_AMBULATORY_CARE_PROVIDER_SITE_OTHER): Payer: MEDICAID | Admitting: Internal Medicine

## 2023-11-08 VITALS — BP 138/90 | HR 80 | Resp 17 | Ht 61.0 in | Wt 227.0 lb

## 2023-11-08 DIAGNOSIS — F17200 Nicotine dependence, unspecified, uncomplicated: Secondary | ICD-10-CM

## 2023-11-08 DIAGNOSIS — Z1231 Encounter for screening mammogram for malignant neoplasm of breast: Secondary | ICD-10-CM

## 2023-11-08 DIAGNOSIS — E782 Mixed hyperlipidemia: Secondary | ICD-10-CM

## 2023-11-08 DIAGNOSIS — Z23 Encounter for immunization: Secondary | ICD-10-CM

## 2023-11-08 DIAGNOSIS — R748 Abnormal levels of other serum enzymes: Secondary | ICD-10-CM

## 2023-11-08 DIAGNOSIS — E039 Hypothyroidism, unspecified: Secondary | ICD-10-CM

## 2023-11-08 DIAGNOSIS — K029 Dental caries, unspecified: Secondary | ICD-10-CM

## 2023-11-08 DIAGNOSIS — D649 Anemia, unspecified: Secondary | ICD-10-CM

## 2023-11-08 DIAGNOSIS — R7989 Other specified abnormal findings of blood chemistry: Secondary | ICD-10-CM

## 2023-11-08 DIAGNOSIS — R7303 Prediabetes: Secondary | ICD-10-CM

## 2023-11-08 NOTE — Progress Notes (Signed)
 Subjective:    Patient ID: Meghan Contreras, female    DOB: 1965-11-20, 58 y.o.   MRN: [id number]  H/P done by me initially who is doc in training. Then I presented to Dr. Adella.  She then did H/P, A/P, DC instructions, both verbal and written while I was in room. Pt conveyed understanding.   This is a record of both evaluations.  CC - Here for Tobacco cessation f/u and I thought I was here for a physical HPI- Tobacco cessation was started aug 27,2024 at which time patches were started, used it for 6 months and last patch 1/205 last patch.  Has been tobacco-free since Oct 26, 2022.   Incidentally, in the interim, surgery for trach was in sept 20,2024.  Needs follow up for it. Says she will call her Trach doctor and make appt.  Sees trach doc q4-6 week.   Not using inhalers anymore.   On disability now. Uses humidifier at night.     Gained 30 pounds after quitting. Says after her surgery food really tasted good but not that is gone. She says she has gained 30 pounds.    Just starting to walk and started back on her diet.   She wants to do 2000 cal/day and eventually go to 1500 cal/day and to increase her water .    Eats some meat but mainly veggies.   3 cups of milk/day.   ROS: Other issues: 1- Consitutional Worried that she might have DM because of the 30 pound wt gain.  Apatite was up of awhile but now down again 2- HEENT Hearing has been out and was addressed before but pt was unable to f/u.  3- HEENT-Says her teeth are bad and needs dental .  Missed dental appt. Says he will make appt. 4- HEENT-audiogram showed decreased hearing.  Has not followed up 5-Breast-Mammogram- never had anyone. Appt Canceled multiple times 6- Endocrine- Taking thyroid pill but has not had blood work recently. Review of labs shows decrease 7- Abdominal hernia- Is able to eat and pass stool.   Feels it might be getting bigger.  Past Medical History: No date: Anemia No date: Anxiety No date: Bipolar disorder  (HCC) No date: COPD (chronic obstructive pulmonary disease) (HCC) No date: Depression No date: Elevated brain natriuretic peptide (BP) level No date: Elevated serum creatinine No date: HOH (hard of hearing) No date: Hyperglycemia 2021: Hyperlipidemia No date: Hyperthyroidism 01/06/2022: Left Ovarian torsion     Comment:  with large fibroma No date: Opiate addiction (HCC)     Comment:  On chronic suboxone  since 2017 this last go around.   No date: Pelvic mass No date: Peripheral edema No date: Pre-diabetes No date: PTSD (post-traumatic stress disorder) 2019: Stridor     Comment:  Developed after total thyroidectomy.  Old records from               [facility] with CT of soft tissue of chest and               neck 11/2017:  No findings to suggest cause of stridorous              breathing and no obvious lung disease.   No date: Tobacco dependence   Patient Active Problem List   Prediabetes        Date Noted: 11/08/2023    Granulation tissue of tracheal stoma [facility])        Date Noted: 05/26/2023    Tracheostomy in  place [facility])        Date Noted: 01/12/2023    Laryngeal polyp        Date Noted: 12/21/2022    Pneumonia of right lower lobe due to infectious organism        Date Noted: 11/29/2022    Acute respiratory failure with hypercapnia (HCC)        Date Noted: 11/27/2022    Peripheral edema        Date Noted: 07/16/2022    Elevated brain natriuretic peptide (BNP) level        Date Noted: 07/16/2022    Elevated BP without diagnosis of hypertension        Date Noted: 06/25/2022    Ovarian torsion        Date Noted: 02/11/2022    Pelvic mass        Date Noted: 01/11/2022    Constipation        Date Noted: 01/11/2022    COPD (chronic obstructive pulmonary disease) (HCC)        Date Noted: 01/07/2022    Chronic hypoxic respiratory failure (HCC)        Date Noted: 01/07/2022    AKI (acute kidney injury) (HCC)        Date Noted: 01/07/2022     Bipolar disorder (HCC)        Date Noted: 01/07/2022    PTSD (post-traumatic stress disorder)        Date Noted: 01/07/2022    Normocytic anemia        Date Noted: 01/07/2022    Hyperglycemia        Date Noted: 01/07/2022    Opioid dependence (HCC)        Date Noted: 01/07/2022    Hypothyroidism        Date Noted: 01/07/2022    Ovarian mass        Date Noted: 01/07/2022    Adnexal mass        Date Noted: 01/07/2022    Left lower quadrant abdominal pain        Date Noted: 01/07/2022    Anxiety state        Date Noted: 01/07/2022    Smoker        Date Noted: 01/07/2022    Umbilical hernia without obstruction or gangrene        Date Noted: 01/07/2022    Panic attacks        Date Noted: 12/28/2021    Tobacco dependence        Date Noted: 10/07/2021    Nightmares associated with chronic post-traumatic stress disorder        Date Noted: 09/02/2021    COPD (chronic obstructive pulmonary disease) with chronic bronchitis (HCC)        Date Noted: 09/02/2021    Elevated serum creatinine        Date Noted: 01/10/2021    Bipolar affective disorder in remission [facility])        Date Noted: 01/10/2021    Decreased hearing        Date Noted: 01/30/2020    Dental decay        Date Noted: 01/30/2020    Opiate addiction (HCC)    Mixed hyperlipidemia    Hypothyroidism        Date Noted: 02/11/2019    Tobacco abuse        Date Noted: 02/11/2019    Depression        Date Noted: 02/11/2019  PTSD (post-traumatic stress disorder)        Date Noted: 02/11/2019    Stridor    Acute on chronic respiratory failure with hypoxemia (HCC)        Date Noted: 08/04/2018    COPD with acute exacerbation (HCC)        Date Noted: 08/04/2018    Hyperthyroidism        Date Noted: 08/04/2018    Acute metabolic encephalopathy        Date Noted: 08/04/2018   Past Surgical History: 11/28/2022: DIRECT LARYNGOSCOPY; N/A     Comment:  Procedure: DIRECT LARYNGOSCOPY;  Surgeon:  Luciano Standing, MD;  Location: [facility] OR;  Service: ENT;  Laterality:              N/A; 01/08/2022: ROBOTIC ASSISTED BILATERAL SALPINGO OOPHERECTOMY; N/A     Comment:  Procedure: XI ROBOTIC ASSISTED BILATERAL SALPINGO               OOPHORECTOMY;  Surgeon: Viktoria Comer SAUNDERS, MD;                Location: [facility];  Service: Gynecology;  Laterality: N/A; 2019: THYROIDECTOMY 11/28/2022: TRACHEOSTOMY TUBE PLACEMENT; N/A     Comment:  Procedure: TRACHEOSTOMY;  Surgeon: Luciano Standing, MD;                Location: [facility] OR;  Service: ENT;  Laterality: N/A;    Outpatient Medications Marked as Taking for the 11/08/23 encounter (Office Visit) with Adella Norris, MD: albuterol  (VENTOLIN  HFA) 108 (90 Base) MCG/ACT inhaler, Inhale 2 puffs into the lungs every 6 (six) hours as needed for wheezing or shortness of breath. Fluticasone -Umeclidin-Vilant (TRELEGY ELLIPTA ) 100-62.5-25 MCG/ACT AEPB, Inhale 1 puff into the lungs daily. gabapentin  (NEURONTIN ) 300 MG capsule, Take 1 capsule (300 mg total) by mouth 3 (three) times daily. ibuprofen (ADVIL) 200 MG tablet, Take 400-600 mg by mouth 2 (two) times daily as needed for moderate pain. levothyroxine  (SYNTHROID ) 150 MCG tablet, TAKE 1 TABLET BY MOUTH DAILY BEFORE BREAKFAST. methadone  (DOLOPHINE ) 10 MG/ML solution, Take 55 mg by mouth daily. Verified with RN Monta Rase 54 mg polyethylene glycol (MIRALAX  / GLYCOLAX ) 17 g packet, Take 17 g by mouth daily as needed for mild constipation. prazosin  (MINIPRESS ) 2 MG capsule, Take 2 capsules (4 mg total) by mouth at bedtime. QUEtiapine  (SEROQUEL ) 100 MG tablet, 1 1/2 tabs in the morning QUEtiapine  (SEROQUEL ) 300 MG tablet, Take 1 tablet (300 mg total) by mouth at bedtime. senna-docusate (SENOKOT-S) 8.6-50 MG tablet, Take 1 tablet by mouth 2 (two) times daily. Do not take if having loose stools    ALL -- Amoxicillin -- Swelling and Rash  -- Latex -- Hives and Swelling  -- Penicillins -- Other (See  Comments)   --  Facial swelling       SoHx: In methadone  program. Quit tobacco as above   PEX Vitals: --------------------           11/08/23              1043    --------------------  BP:     (!) 138/90  Pulse:      80      Resp:       17     -------------------- . Weight Most recent update: 11/08/2023 10:47 AM  103 kg (227 lb)  Last wt:184lbs last visit  Physical Exam: Vitals and RN note reviewed  Constitutional- well-appearing for pt.   Mask and trach.  NAD. Able to speak in fyull sentences with occasional pauses due to large abdomen and trach HEENT- conjunctivae white.   OP with poor dentition and back teeth missing.  Ears OK per Dr. Adella. Neck- trach intact. Resp= Nl resp effort for pt.   No rales or stridor audible CV- RRR with good radial pulses. Difficult to palpate pedal pulses ( may be skill of examiner) but feet warm and no ulcers. No pitting edema Abdomen- Cannot detect the abdominal hernia ? Baseball -size hernia on the LUQ that is non tender. No clear fluid wave or hyper or hypo resonance. MS- no gross abnormality. Able to move all. Neuro- alert and oriented times 3, good judgement.   Normal gait.  Normal reasoning.    Psych - normal judgment , mood ,affect. Smiles at times . Jokes at times.  Looks fairly happy Skin- no rash on chest.    Recent labs from rehab clinic 10/12/23 faxed over; Essentially normal CMP except slight elevation of AST/ALT/total protein 74/59/9.1 respectively. Hep B profile- immune. No active disease Syphilis- neg Hep C antibibody negative and HCV RNA negative. CBC slight anemia of 10.5   ( will scan the report into EPIC).  Labs from past reviewed on EPIC.  Remarkable with slight increases in cr and TSH and pre-diabetes.   A/P         1- Tobacco cessation- at goal . Pt encouraged to continue the good work 2- Wt gain- discussed calorie log.  Restrict 2 to 2000cal/day and eventually to 1500/day and continue  drinking milk 3cups /day.   Asked to make sure there is vit D.   Increase Physical Activity in gradients and work up to 1 hour/day 3- Pre-diabetes, recent 40 pound wt gain, r/o onset of DM- Hgb A1c, CMP, lipids, urine albumin/cr to further assess;  Discussed with her starting [place] or [place] in case it is indicated pending HgbA1c;  pt said she would be ok with injection if labs came back positive for DM     4-Trach- pt to f/u with [facility] doctor.  She will make appt.   Eventually they might take it down 5- Poor dentition- says she does not have dentist.   Advised to go to dental by [facility]. 6-Hearing loss per audiogram- F/U given 7-h/o Hypothyroidism on synthroid - TSH level today 8-abdominal hernia-   not incarcerated or strangled but very large abdomen per ROS and exam.  Wt loss for now. Monitor 9- Large abdomen- fat vs ascites- US  abdomen 10 elev cr- repeat labs, 11- Health Maintenance-  She has to check with her insurance.  She needs to get rid of one, [employer] or [employer]; she needs to make sure the one she keeps covers everything..  Shingles vaccine and f/u for flu and COVID vaccine . Will setup next Mammo. 12-Yearly Physical exam in 6 months.   RTC pending labs and in 6 months.           Objective:  Associate Professor.  See under subjective above.   Ignore this section.        Assessment & Pln:   Error. A/p above. Ignore this section

## 2023-11-08 NOTE — Patient Instructions (Signed)
 Drink a glass of water before every meal Drink 6-8 glasses of water daily Eat three meals daily Eat a protein and healthy fat with every meal (eggs,fish, chicken, [place] and limit red meats) Eat 5 servings of vegetables daily, mix the colors Eat 2 servings of fruit daily with skin, if skin is edible Use smaller plates Put food/utensils down as you chew and swallow each bite Eat at a table with friends/family at least once daily, no TV Do not eat in front of the TV  Recent studies show that people who consume all of their calories in a 12 hour period lose weight more efficiently.  For example, if you eat your first meal at 7:00 a.m., your last meal of the day should be completed by 7:00 p.m.

## 2023-11-09 LAB — CBC WITH DIFFERENTIAL/PLATELET
Basophils Absolute: 0 x10E3/uL (ref 0.0–0.2)
Basos: 1 %
EOS (ABSOLUTE): 0.1 x10E3/uL (ref 0.0–0.4)
Eos: 1 %
Hematocrit: 36.8 % (ref 34.0–46.6)
Hemoglobin: 11.6 g/dL (ref 11.1–15.9)
Immature Grans (Abs): 0 x10E3/uL (ref 0.0–0.1)
Immature Granulocytes: 0 %
Lymphocytes Absolute: 2.5 x10E3/uL (ref 0.7–3.1)
Lymphs: 41 %
MCH: 27.3 pg (ref 26.6–33.0)
MCHC: 31.5 g/dL (ref 31.5–35.7)
MCV: 87 fL (ref 79–97)
Monocytes Absolute: 0.4 x10E3/uL (ref 0.1–0.9)
Monocytes: 6 %
Neutrophils Absolute: 3 x10E3/uL (ref 1.4–7.0)
Neutrophils: 51 %
Platelets: 264 x10E3/uL (ref 150–450)
RBC: 4.25 x10E6/uL (ref 3.77–5.28)
RDW: 14.5 % (ref 11.7–15.4)
WBC: 5.9 x10E3/uL (ref 3.4–10.8)

## 2023-11-09 LAB — LIPID PANEL W/O CHOL/HDL RATIO
Cholesterol, Total: 262 mg/dL — ABNORMAL HIGH (ref 100–199)
HDL: 23 mg/dL — ABNORMAL LOW (ref >39)
LDL Chol Calc (NIH): 141 mg/dL — ABNORMAL HIGH (ref 0–99)
Triglycerides: 523 mg/dL — ABNORMAL HIGH (ref 0–149)
VLDL Cholesterol Cal: 98 mg/dL — ABNORMAL HIGH (ref 5–40)

## 2023-11-09 LAB — COMPREHENSIVE METABOLIC PANEL WITH GFR
ALT: 76 IU/L — ABNORMAL HIGH (ref 0–32)
AST: 57 IU/L — ABNORMAL HIGH (ref 0–40)
Albumin: 4.6 g/dL (ref 3.8–4.9)
Alkaline Phosphatase: 124 IU/L — ABNORMAL HIGH (ref 44–121)
BUN/Creatinine Ratio: 15 (ref 9–23)
BUN: 19 mg/dL (ref 6–24)
Bilirubin Total: 0.4 mg/dL (ref 0.0–1.2)
CO2: 24 mmol/L (ref 20–29)
Calcium: 10.2 mg/dL (ref 8.7–10.2)
Chloride: 98 mmol/L (ref 96–106)
Creatinine, Ser: 1.24 mg/dL — ABNORMAL HIGH (ref 0.57–1.00)
Globulin, Total: 4.5 g/dL (ref 1.5–4.5)
Glucose: 101 mg/dL — ABNORMAL HIGH (ref 70–99)
Potassium: 4.5 mmol/L (ref 3.5–5.2)
Sodium: 138 mmol/L (ref 134–144)
Total Protein: 9.1 g/dL — ABNORMAL HIGH (ref 6.0–8.5)
eGFR: 51 mL/min/1.73 — ABNORMAL LOW (ref >59)

## 2023-11-09 LAB — HGB A1C W/O EAG: Hgb A1c MFr Bld: 6.8 % — ABNORMAL HIGH (ref 4.8–5.6)

## 2023-11-09 LAB — TSH: TSH: 14.8 u[IU]/mL — ABNORMAL HIGH (ref 0.450–4.500)

## 2023-11-10 ENCOUNTER — Other Ambulatory Visit: Payer: Self-pay | Admitting: Internal Medicine

## 2023-11-24 ENCOUNTER — Ambulatory Visit (HOSPITAL_COMMUNITY)
Admission: RE | Admit: 2023-11-24 | Discharge: 2023-11-24 | Disposition: A | Discharge status: Home / Self Care | Payer: MEDICAID | Source: Ambulatory Visit | Attending: Acute Care | Admitting: Acute Care

## 2023-11-24 DIAGNOSIS — J381 Polyp of vocal cord and larynx: Secondary | ICD-10-CM | POA: Diagnosis not present

## 2023-11-24 DIAGNOSIS — Z930 Tracheostomy status: Secondary | ICD-10-CM

## 2023-11-24 DIAGNOSIS — J449 Chronic obstructive pulmonary disease, unspecified: Secondary | ICD-10-CM

## 2023-11-24 DIAGNOSIS — J4489 Other specified chronic obstructive pulmonary disease: Secondary | ICD-10-CM | POA: Diagnosis present

## 2023-11-24 NOTE — Progress Notes (Signed)
 Tracheostomy Procedure Note  Meghan Contreras [id number] 1965/08/13  Pre Procedure Tracheostomy Information  Trach Brand: Shiley Size: 4.0 [id number] Style: Uncuffed Secured by: Velcro   Procedure: Trach Change and Trach Cleaning    Post Procedure Tracheostomy Information  Trach Brand: Shiley Size: 4.0  [id number] Style: Uncuffed Secured by: Velcro   Post Procedure Evaluation:  ETCO2 positive color change from yellow to purple : Yes.   Vital signs:VSS Patients current condition: stable Complications: No apparent complications Trach site exam: clean, dry Wound care done: 4 x 4 gauze darin Patient did tolerate procedure well.   Education: none  Prescription needs: none    Additional needs: New PMV given at this visit

## 2023-11-24 NOTE — Progress Notes (Signed)
 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  Was intubated, initially treated for PNA, extubated and developed post-extubation stridor. ENT was consulted for this and post extubation dysphonia. 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 Went for trach 9/29. Mass biopsied. Was benign. Ultimately discharged 10/8. -Getting her trach supplies from [employer] [phone]  Presents once again for planned tracheostomy change  Review of Systems  Constitutional:  Negative for fever, malaise/fatigue and weight loss.  HENT: Negative.    Eyes: Negative.   Respiratory:  Negative for shortness of breath.   Cardiovascular: Negative.   Gastrointestinal: Negative.   Genitourinary: Negative.   Musculoskeletal: Negative.   Skin: Negative.     Exam  General 58 year old female patient currently in no acute distress HEENT #4 cuffless tracheostomy phonation quality strong, stoma with mild PERI stoma granulation tissue, not draining or painful, still with some mild turbulent airflow with PMV in place which resolved post removal Pulmonary clear to auscultation Cardiac regular rate and rhythm Abdomen soft nontender Extremities warm dry  Procedure  #4 Trach removed.  Site inspected and was nonremarkable.  A new tracheostomy was placed over obturator patient tolerated well end-tidal CO2 confirmed  Impression/plan  Active Problems:   Tracheostomy in place [facility])   COPD (chronic obstructive pulmonary disease) with chronic bronchitis (HCC)   Laryngeal polyp  Tracheostomy in place [facility]) Overview: Trach size 4 cuffless I changed this on 9/25, today DME [employer] [phone] ENT [place]  Discussion  She has not seen ENT since tracheostomy initially placed.  With her prior history of vocal cord mass  (although benign) as well as evidence of some subglottic stenosis with her marked air trapping noted after trach cuff removal I do not think we can proceed with evaluation for decannulation until she has had a objective exam by ENT to #1 rule out return of her vocal cord mass but also #2 evaluate for subglottic stenosis. Last time I saw her our goal was to get her physical conditioning better and also get some weight off before we refer to ENT.  She is making some progress but we are still not there plan Cont routine trach care Okay to continue to alternate PMV and occlusive read tracheostomy capping.  I have recommended that she remove PMV During exertion given her degree of air trapping and activity intolerance Continue to encourage increased activity Occlusive red cap during day (as needed and must be removed at night and remove PRN during day when SOB) ROV 10 to 12 weeks Cont routine trach care          My time 18   minutes

## 2023-12-05 ENCOUNTER — Other Ambulatory Visit: Payer: Self-pay | Admitting: Internal Medicine

## 2023-12-20 ENCOUNTER — Other Ambulatory Visit (HOSPITAL_COMMUNITY): Payer: Self-pay | Admitting: Psychiatry

## 2023-12-20 DIAGNOSIS — F411 Generalized anxiety disorder: Secondary | ICD-10-CM

## 2023-12-23 ENCOUNTER — Telehealth (HOSPITAL_COMMUNITY): Payer: Self-pay | Admitting: Psychiatry

## 2023-12-23 NOTE — Telephone Encounter (Signed)
 Patient called. He speech was very slurred and at one point she went silent on the phone and I had to repeatedly ask if she was still on the phone. She just picked up her last refill and she requested another script. Seroquel  100 and 300 mg. I did note to the patient that they have an appointment on November 13. The patient replied she should have enough medication until that appointment.  Patient has been experiencing increasing anxiety.    Please advise. Thank you.

## 2023-12-27 ENCOUNTER — Other Ambulatory Visit (HOSPITAL_COMMUNITY): Payer: Self-pay | Admitting: Psychiatry

## 2023-12-27 DIAGNOSIS — F411 Generalized anxiety disorder: Secondary | ICD-10-CM

## 2023-12-27 DIAGNOSIS — F3170 Bipolar disorder, currently in remission, most recent episode unspecified: Secondary | ICD-10-CM

## 2023-12-27 MED ORDER — QUETIAPINE FUMARATE 100 MG PO TABS: ORAL_TABLET | ORAL | 3 refills | Status: DC

## 2023-12-27 MED ORDER — QUETIAPINE FUMARATE 300 MG PO TABS: 300.0000 mg | ORAL_TABLET | Freq: Every day | ORAL | 3 refills | Status: DC

## 2023-12-27 NOTE — Telephone Encounter (Signed)
 Provider attempted to call patient twice without success. A female family member picked up and notes that the patient needs refills for her medication. Medication sent to preferred pharmacy. Provider instructed family member to have Meghan Contreras to call for further assistance if needed.

## 2023-12-30 ENCOUNTER — Other Ambulatory Visit: Payer: Self-pay | Admitting: Internal Medicine

## 2023-12-30 ENCOUNTER — Other Ambulatory Visit (HOSPITAL_COMMUNITY): Payer: Self-pay | Admitting: Psychiatry

## 2023-12-30 DIAGNOSIS — F411 Generalized anxiety disorder: Secondary | ICD-10-CM

## 2024-01-01 ENCOUNTER — Ambulatory Visit: Payer: Self-pay | Admitting: Internal Medicine

## 2024-01-01 NOTE — Progress Notes (Signed)
 Patient seen with Dr. Fleta.  Agree with assessment and plan

## 2024-01-12 ENCOUNTER — Telehealth (INDEPENDENT_AMBULATORY_CARE_PROVIDER_SITE_OTHER): Payer: MEDICAID | Admitting: Psychiatry

## 2024-01-12 ENCOUNTER — Encounter (HOSPITAL_COMMUNITY): Payer: Self-pay | Admitting: Psychiatry

## 2024-01-12 DIAGNOSIS — F3170 Bipolar disorder, currently in remission, most recent episode unspecified: Secondary | ICD-10-CM

## 2024-01-12 DIAGNOSIS — F411 Generalized anxiety disorder: Secondary | ICD-10-CM | POA: Diagnosis not present

## 2024-01-12 MED ORDER — GABAPENTIN 300 MG PO CAPS: 300.0000 mg | ORAL_CAPSULE | Freq: Three times a day (TID) | ORAL | 3 refills | Status: DC

## 2024-01-12 MED ORDER — QUETIAPINE FUMARATE 300 MG PO TABS: 300.0000 mg | ORAL_TABLET | Freq: Every day | ORAL | 3 refills | Status: DC

## 2024-01-12 MED ORDER — QUETIAPINE FUMARATE 100 MG PO TABS: ORAL_TABLET | ORAL | 3 refills | Status: DC

## 2024-01-12 MED ORDER — PRAZOSIN HCL 2 MG PO CAPS: 4.0000 mg | ORAL_CAPSULE | Freq: Every day | ORAL | 3 refills | Status: DC

## 2024-01-12 NOTE — Progress Notes (Signed)
 [facility] MD/PA/NP OP Progress Note   Virtual Visit via Video Note  I connected with Meghan Contreras on 01/12/24 at 11:30 AM EST by a video enabled telemedicine application and verified that I am speaking with the correct person using two identifiers.  Location: Patient: Home Provider: Clinic   I discussed the limitations of evaluation and management by telemedicine and the availability of in person appointments. The patient expressed understanding and agreed to proceed.  I provided 30 minutes of non-face-to-face time during this encounter.      01/12/2024 12:11 PM Meghan Contreras  MRN:  [id number]  Chief Complaint: I have been having some anxiety  HPI: 58 year old female seen today for follow up psychiatric evaluation.  She has a psychiatric history of depression, PTSD, bipolar affective disorder, tobacco dependence, and opioid addiction (in remission five years).  Patient is currently prescribed gabapentin  300 mg 3 times daily, Seroquel  300 mg nightly, Seroquel  150 mg daily, and prazosin  4 mg nightly by her PCP. She notes that her medications are effective in managing her psychiatric conditions.   Today she was well-groomed, pleasant, cooperative, and engaged in conversation.  Patient notes that she has been having some anxiety. She reports that she has been having anxiety attacks one or two times a day in the morning. She is unsure what is triggering it as she notes that her life is good.She notes that she is grateful for life and God.  Provider recommended doing sensory techniques and deep breathing.  She notes that she will try.  Today provider conducted a GAD-7 and patient scored a 13.  Provider also conducted PHQ-9 and patient scored a 9.  She endorses adequate sleep and appetite. Today she denies SI/HI/AVH, mania, paranoia.  Patient informed writer that since getting her trach she is breathing better.  She does note that her anxiety occurred after getting her trach.  Patient notes that she  has not smoking tobacco in over a year.  No medication changes made today.  Patient agreeable to continue medications as prescribed.  She will utilize sensory techniques and deep breathing to help manage her anxiety.  No other concerns noted at this time.     Visit Diagnosis:    ICD-10-CM   1. Generalized anxiety disorder  F41.1 QUEtiapine  (SEROQUEL ) 300 MG tablet    QUEtiapine  (SEROQUEL ) 100 MG tablet    gabapentin  (NEURONTIN ) 300 MG capsule    2. Bipolar affective disorder in remission  F31.70 QUEtiapine  (SEROQUEL ) 300 MG tablet    QUEtiapine  (SEROQUEL ) 100 MG tablet    gabapentin  (NEURONTIN ) 300 MG capsule          Past Psychiatric History: depression, PTSD, bipolar affective disorder, tobacco dependence, and opioid addiction (in remission four years).  Past Medical History:  Past Medical History:  Diagnosis Date   Anemia    Anxiety    Bipolar disorder (HCC)    COPD (chronic obstructive pulmonary disease) (HCC)    Depression    Elevated brain natriuretic peptide (BNP) level    Elevated serum creatinine    HOH (hard of hearing)    Hyperglycemia    Hyperlipidemia 2021   Hyperthyroidism    Left Ovarian torsion 01/06/2022   with large fibroma   Opiate addiction (HCC)    On chronic suboxone  since 2017 this last go around.     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  Old records from [facility] with CT of soft tissue of chest and neck 11/2017:  No findings to suggest cause of stridorous breathing and no obvious lung disease.     Tobacco dependence     Past Surgical History:  Procedure Laterality Date   DIRECT LARYNGOSCOPY N/A 11/28/2022   Procedure: DIRECT LARYNGOSCOPY;  Surgeon: Luciano Standing, MD;  Location: [facility] OR;  Service: ENT;  Laterality: N/A;   ROBOTIC ASSISTED BILATERAL SALPINGO OOPHERECTOMY N/A 01/08/2022   Procedure: XI ROBOTIC ASSISTED BILATERAL  SALPINGO OOPHORECTOMY;  Surgeon: Viktoria Comer SAUNDERS, MD;  Location: [facility] ORS;  Service: Gynecology;  Laterality: N/A;   THYROIDECTOMY  2019   TRACHEOSTOMY TUBE PLACEMENT N/A 11/28/2022   Procedure: TRACHEOSTOMY;  Surgeon: Luciano Standing, MD;  Location: [facility] OR;  Service: ENT;  Laterality: N/A;    Family Psychiatric History: Substance use sister  Family History:  Family History  Problem Relation Age of Onset   HIV/AIDS Mother        from drug abuse   Drug abuse Mother    Drug abuse Brother    Hepatitis C Brother        From IV drug abuse   Drug abuse Sister    Drug abuse Sister    Drug abuse Sister    Drug abuse Sister    Drug abuse Sister    Cancer Sister        lung   Drug abuse Sister     Social History:  Social History   Socioeconomic History   Marital status: Legally Separated    Spouse name: Not on file   Number of children: 2   Years of education: Not on file   Highest education level: Some college, no degree  Occupational History   Occupation: unemployed    Comment: Supported by daughter, ex husband and boyfriend.  Tobacco Use   Smoking status: Every Day    Current packs/day: 0.25    Average packs/day: 0.3 packs/day for 42.0 years (10.5 ttl pk-yrs)    Types: Cigarettes    Passive exposure: Current   Smokeless tobacco: Former   Tobacco comments:    Quit a year aug 2024  Vaping Use   Vaping status: Never Used  Substance and Sexual Activity   Alcohol use: No   Drug use: Not Currently    Comment: Benzodiazepines / Klonopin/opiate addiction.  Also history of crack cocaine abuse.  2021 clean for 2017 on Suboxone .    Sexual activity: Not Currently  Other Topics Concern   Not on file  Social History Narrative    Lives alone, though her boyfriend will visit for 2 months at a time.   He lives with his daughter in [place] otherwise.   Social Drivers of Health   Financial Resource Strain: Medium Risk (02/11/2022)   Overall Financial Resource Strain (CARDIA)     Difficulty of Paying Living Expenses: Somewhat hard  Food Insecurity: Patient Unable To Answer (11/27/2022)   Hunger Vital Sign    Worried About Running Out of Food in the Last Year: Patient unable to answer    Ran Out of Food in the Last Year: Patient unable to answer  Transportation Needs: No Transportation Needs (02/11/2022)   PRAPARE - Administrator, Civil Service (Medical): No    Lack of Transportation (Non-Medical): No  Physical Activity: Not on file  Stress: Not on file  Social Connections: Not on file    Allergies:  Allergies  Allergen Reactions   Amoxicillin  Swelling and Rash   Latex Hives and Swelling   Penicillins Other (See Comments)    Facial swelling    Metabolic Disorder Labs: Lab Results  Component Value Date   HGBA1C 6.8 (H) 11/08/2023   MPG 119.76 12/01/2022   MPG 119.76 01/07/2022   No results found for: PROLACTIN Lab Results  Component Value Date   CHOL 262 (H) 11/08/2023   TRIG 523 (H) 11/08/2023   HDL 23 (L) 11/08/2023   LDLCALC 141 (H) 11/08/2023   LDLCALC 75 06/22/2022   Lab Results  Component Value Date   TSH 14.800 (H) 11/08/2023   TSH 5.690 (H) 06/22/2022    Therapeutic Level Labs: No results found for: LITHIUM No results found for: VALPROATE No results found for: CBMZ  Current Medications: Current Outpatient Medications  Medication Sig Dispense Refill   albuterol  (VENTOLIN  HFA) 108 (90 Base) MCG/ACT inhaler Inhale 2 puffs into the lungs every 6 (six) hours as needed for wheezing or shortness of breath. 18 g 4   Fluticasone -Umeclidin-Vilant (TRELEGY ELLIPTA ) 100-62.5-25 MCG/ACT AEPB Inhale 1 puff into the lungs daily. 1 each 11   furosemide  (LASIX ) 40 MG tablet TAKE 1 TABLET BY MOUTH EVERY DAY 30 tablet 10   gabapentin  (NEURONTIN ) 300 MG capsule Take 1 capsule (300 mg total) by mouth 3 (three) times daily. 90 capsule 3   ibuprofen (ADVIL) 200 MG tablet Take 400-600 mg by mouth 2 (two) times daily as needed for  moderate pain.     iron  polysaccharides (NIFEREX) 150 MG capsule Take 1 capsule (150 mg total) by mouth daily. (Patient not taking: Reported on 12/21/2022) 30 capsule 4   levothyroxine  (SYNTHROID ) 150 MCG tablet TAKE 1 TABLET BY MOUTH DAILY BEFORE BREAKFAST. 30 tablet 11   methadone  (DOLOPHINE ) 10 MG/ML solution Take 55 mg by mouth daily. Verified with RN Monta Rase 54 mg     polyethylene glycol (MIRALAX  / GLYCOLAX ) 17 g packet Take 17 g by mouth daily as needed for mild constipation. 14 each 0   prazosin  (MINIPRESS ) 2 MG capsule Take 2 capsules (4 mg total) by mouth at bedtime. 60 capsule 3   QUEtiapine  (SEROQUEL ) 100 MG tablet 1 1/2 tabs in the morning 45 tablet 3   QUEtiapine  (SEROQUEL ) 300 MG tablet Take 1 tablet (300 mg total) by mouth at bedtime. 30 tablet 3   senna-docusate (SENOKOT-S) 8.6-50 MG tablet Take 1 tablet by mouth 2 (two) times daily. Do not take if having loose stools 60 tablet 0   No current facility-administered medications for this visit.     Musculoskeletal: Strength & Muscle Tone: within normal limits Gait & Station: normal Patient leans: N/A  Psychiatric Specialty Exam: Review of Systems  There were no vitals taken for this visit.There is no height or weight on file to calculate BMI.  General Appearance: Well Groomed  Eye Contact:  Good  Speech:  Clear and Coherent and Normal Rate  Volume:  Normal  Mood:  Anxious  Affect:  Appropriate and Congruent  Thought Process:  Coherent, Goal Directed, and Linear  Orientation:  Full (Time, Place, and Person)  Thought Content: WDL and Logical   Suicidal Thoughts:  No  Homicidal Thoughts:  No  Memory:  Immediate;   Good Recent;   Good Remote;   Good  Judgement:  Good  Insight:  Good  Psychomotor Activity:  Normal  Concentration:  Concentration: Good and Attention Span: Good  Recall:  Good  Fund of Knowledge: Good  Language: Good  Akathisia:  No  Handed:  Right  AIMS (if indicated): not done  Assets:   Communication Skills Desire for Improvement Financial Resources/Insurance Housing Intimacy Leisure Time Social Support  ADL's:  Intact  Cognition: WNL  Sleep:  Good   Screenings: GAD-7    Flowsheet Row Video Visit from 01/12/2024 in [facility] Video Visit from 08/03/2023 in [facility] Video Visit from 06/01/2023 in [facility] Clinical Support from 03/21/2023 in [facility] Clinical Support from 12/30/2021 in [facility]  Total GAD-7 Score 13 5 4 9 15    PHQ2-9    Flowsheet Row Video Visit from 01/12/2024 in [facility] Video Visit from 08/03/2023 in [facility] Video Visit from 06/01/2023 in [facility] Clinical Support from 03/21/2023 in [facility] Clinical Support from 12/30/2021 in [facility]  PHQ-2 Total Score 2 0 0 0 0  PHQ-9 Total Score 9 7 3 5 4    Flowsheet Row ED to Hosp-Admission (Discharged) from 11/27/2022 in [facility] 3E HF PCU ED from 06/07/2022 in [facility] Emergency Department at [facility] ED from 03/29/2022 in [facility] Emergency Department at [facility] Psychiatric  C-SSRS RISK CATEGORY No Risk No Risk No Risk     Assessment and Plan: Patient reports that she has increased anxiety. Provider recommended sensory techniques. No medication changes made today.  Patient agreeable to continue medications as prescribed.  She will utilize sensory techniques and deep breathing to help manage her anxiety.  1. Generalized anxiety disorder  Continue- QUEtiapine  (SEROQUEL ) 300 MG tablet; Take 1 tablet (300 mg total) by mouth at bedtime.  Dispense: 30 tablet; Refill: 3 Continue- QUEtiapine  (SEROQUEL ) 100 MG tablet; 1 1/2 tabs in the morning  Dispense: 45 tablet; Refill: 3 Continue-  gabapentin  (NEURONTIN ) 300 MG capsule; Take 1 capsule (300 mg total) by mouth 3 (three) times daily.  Dispense: 90 capsule; Refill: 3  2. 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) (Primary)  Continue- QUEtiapine  (SEROQUEL ) 300 MG tablet; Take 1 tablet (300 mg total) by mouth at bedtime.  Dispense: 30 tablet; Refill: 3 Continue- QUEtiapine  (SEROQUEL ) 100 MG tablet; 1 1/2 tabs in the morning  Dispense: 45 tablet; Refill: 3 Continue- gabapentin  (NEURONTIN ) 300 MG capsule; Take 1 capsule (300 mg total) by mouth 3 (three) times daily.  Dispense: 90 capsule; Refill: 3   Follow-up in 2.5 months Follow-up with therapy  Zane FORBES Bach, NP 01/12/2024, 12:11 PM

## 2024-01-17 ENCOUNTER — Encounter: Payer: Self-pay | Admitting: Internal Medicine

## 2024-01-17 ENCOUNTER — Ambulatory Visit: Payer: MEDICAID | Admitting: Internal Medicine

## 2024-01-17 VITALS — BP 120/80 | HR 85 | Resp 13 | Ht 63.0 in | Wt 226.0 lb

## 2024-01-17 DIAGNOSIS — R7989 Other specified abnormal findings of blood chemistry: Secondary | ICD-10-CM

## 2024-01-17 DIAGNOSIS — R198 Other specified symptoms and signs involving the digestive system and abdomen: Secondary | ICD-10-CM

## 2024-01-17 DIAGNOSIS — E039 Hypothyroidism, unspecified: Secondary | ICD-10-CM

## 2024-01-17 DIAGNOSIS — R7303 Prediabetes: Secondary | ICD-10-CM

## 2024-01-17 DIAGNOSIS — Z1231 Encounter for screening mammogram for malignant neoplasm of breast: Secondary | ICD-10-CM

## 2024-01-17 DIAGNOSIS — H9193 Unspecified hearing loss, bilateral: Secondary | ICD-10-CM

## 2024-01-17 DIAGNOSIS — E119 Type 2 diabetes mellitus without complications: Secondary | ICD-10-CM

## 2024-01-17 DIAGNOSIS — J4489 Other specified chronic obstructive pulmonary disease: Secondary | ICD-10-CM

## 2024-01-17 DIAGNOSIS — Z930 Tracheostomy status: Secondary | ICD-10-CM

## 2024-01-17 DIAGNOSIS — E782 Mixed hyperlipidemia: Secondary | ICD-10-CM

## 2024-01-17 MED ORDER — LEVOTHYROXINE SODIUM 175 MCG PO TABS: ORAL_TABLET | ORAL | 11 refills | Status: AC

## 2024-01-17 MED ORDER — ATORVASTATIN CALCIUM 40 MG PO TABS: ORAL_TABLET | ORAL | 11 refills | Status: AC

## 2024-01-17 NOTE — Progress Notes (Signed)
 "   Subjective:    Patient ID: Meghan Contreras, female   DOB: 30-Mar-1965, 58 y.o.   MRN: [id number]   HPI   Enlarged abdominal girth and significant weight gain in past year:  she never responded to calls for setting up abdominal US .  Apparently called 3 times.  She would like us  to set up for Meghan Contreras to be called and set up appt going forward.    2.  Hypothyroidism:  TSH up and down, most recent at 14.8, but not at therapeutic goal.  Not clear if she is missing doses.  No pill box.  Energy is up and down.  She is on Methadone  as well, so not clear if that is cause of chronic constipation as well.  Does have dry skin, but not hair.  Hair may be falling out .    3.  Hyperlipidemia:  Remains high and not on statin.  Again, thyroid not adequately replaced.   Lipid Panel     Component Value Date/Time   CHOL 262 (H) 11/08/2023 1155   TRIG 523 (H) 11/08/2023 1155   HDL 23 (L) 11/08/2023 1155   LDLCALC 141 (H) 11/08/2023 1155   LABVLDL 98 (H) 11/08/2023 1155     4.  Tobacco use disorder:  has been able to avoid tobacco since 10/2022.    5.  CKD:  Recurred with her hospitalization last fall requiring trach for upper airway obstruction.  .Increased to 1.24 with September labs  6.  Laryngeal polyp:  reportedly plans for removal as her respiratory status stabilizes with possible plans to reverse her trach. She is breathing very well.   7.  COPD:  using Trelegy ellipta  daily and feels her breathing is very good. Not smoking as above  8.  Elevated liver enzymes:  likely due to fatty liver with her cholesterol/trig levels, though still not clear if other issue with liver, such as cirrhosis with ascites.  Need the abdominal US .    9.  Decreased hearing:  she was evaluated and found to have bilateral moderately severe hearing loss would benefit from amplification, but never pursued.  10.  DM:  A1C up to 6.8% in September along with increase in abdominal girth.  Previously with mild prediabetes since  2023  Current Meds  Medication Sig   albuterol  (VENTOLIN  HFA) 108 (90 Base) MCG/ACT inhaler Inhale 2 puffs into the lungs every 6 (six) hours as needed for wheezing or shortness of breath.   Fluticasone -Umeclidin-Vilant (TRELEGY ELLIPTA ) 100-62.5-25 MCG/ACT AEPB Inhale 1 puff into the lungs daily.   furosemide  (LASIX ) 40 MG tablet TAKE 1 TABLET BY MOUTH EVERY DAY   gabapentin  (NEURONTIN ) 300 MG capsule Take 1 capsule (300 mg total) by mouth 3 (three) times daily.   ibuprofen (ADVIL) 200 MG tablet Take 400-600 mg by mouth 2 (two) times daily as needed for moderate pain.   levothyroxine  (SYNTHROID ) 150 MCG tablet TAKE 1 TABLET BY MOUTH DAILY BEFORE BREAKFAST.   methadone  (DOLOPHINE ) 10 MG/ML solution Take 55 mg by mouth daily. Verified with RN Monta Rase 54 mg   prazosin  (MINIPRESS ) 2 MG capsule Take 2 capsules (4 mg total) by mouth at bedtime.   QUEtiapine  (SEROQUEL ) 100 MG tablet 1 1/2 tabs in the morning   QUEtiapine  (SEROQUEL ) 300 MG tablet Take 1 tablet (300 mg total) by mouth at bedtime.    Allergies  Allergen Reactions   Amoxicillin Swelling and Rash   Latex Hives and Swelling   Penicillins Other (See Comments)  Facial swelling     Review of Systems    Objective:   BP 120/80 (BP Location: Right Arm, Patient Position: Sitting, Cuff Size: Normal)   Pulse 85   Resp 13   Ht 5' 3 (1.6 m)   Wt 226 lb (102.5 kg)   BMI 40.03 kg/m   Physical Exam Constitutional:      Appearance: She is obese.  HENT:     Head: Normocephalic and atraumatic.     Right Ear: Tympanic membrane normal.     Left Ear: Tympanic membrane normal.     Nose: Nose normal.     Mouth/Throat:     Mouth: Mucous membranes are moist.     Pharynx: Oropharynx is clear.  Eyes:     Extraocular Movements: Extraocular movements intact.     Conjunctiva/sclera: Conjunctivae normal.     Pupils: Pupils are equal, round, and reactive to light.  Neck:     Comments: Trach in place. Voice with glottic, hoarse  sound as per usual Cardiovascular:     Rate and Rhythm: Normal rate and regular rhythm.     Heart sounds: No murmur heard.    No friction rub.  Pulmonary:     Effort: Pulmonary effort is normal.     Breath sounds: Normal breath sounds.  Abdominal:     General: Abdomen is protuberant. Bowel sounds are normal.     Palpations: Abdomen is soft. There is no shifting dullness (no clear shifting dullness), fluid wave, hepatomegaly, splenomegaly or mass.     Tenderness: There is no abdominal tenderness.     Hernia: No hernia is present.  Musculoskeletal:        General: Normal range of motion.     Cervical back: Normal range of motion and neck supple.  Lymphadenopathy:     Head:     Right side of head: No submental or submandibular adenopathy.     Left side of head: No submental or submandibular adenopathy.     Cervical: No cervical adenopathy.     Upper Body:     Right upper body: No supraclavicular adenopathy.     Left upper body: No supraclavicular adenopathy.  Neurological:     Mental Status: She is alert.      Assessment & Plan   Significant change in abdominal girth and elevated transaminases:  not clear if ascites as cause or just weight gain.  Have been unable to get her in for abdominal US .  Tried to call partner, Meghan Contreras, but no response.  Patient was [facility] radiology could not get in contact with her and asked that we ask them to call Meghan Contreras to set up.  Order resent accordingly.    2.  Hypothyroidism:  Asked her to get a pill box.  Again, could not get hold of Meghan Contreras.  Suspect she is missing meds.  As TSH has remained elevated at differing levels, will increase levothyroxine  to 175 mcg daily.  3.  Hyperlipidemia:  Start Atorvastatin  40 mg daily.  Repeat fasting labs in 2 months.  4.  COPD:  stable on Trelegy and as needed Albuterol   5.  Trach and Laryngeal polyp:  Not clear when plans for surgery.  6.  Decreased hearing acuity:  referral to ENT for hearing aides  Did not  follow through previously following audiology evaluation.  7.  CKD since hospitalization:  a bit up in September.  Repeat in 2 months with fasting labs  8.  DM:  new onset.  Would  like to see if improves with taking higher dose of Levothyroxine .  Difficulties getting her on new meds and in goal range currently for DM control    Recheck with fasting labs in 2 months  9.  HM:  Mammogram.  Encouraged her to get influenza vaccine at pharmacy of choice as out currently "

## 2024-01-17 NOTE — Patient Instructions (Signed)
 Get your flu shot at pharmacy of choice.

## 2024-01-18 ENCOUNTER — Telehealth (HOSPITAL_COMMUNITY): Payer: Self-pay | Admitting: Psychiatry

## 2024-01-19 ENCOUNTER — Other Ambulatory Visit (HOSPITAL_COMMUNITY): Payer: Self-pay | Admitting: Psychiatry

## 2024-01-19 DIAGNOSIS — F3170 Bipolar disorder, currently in remission, most recent episode unspecified: Secondary | ICD-10-CM

## 2024-01-19 DIAGNOSIS — F411 Generalized anxiety disorder: Secondary | ICD-10-CM

## 2024-01-19 MED ORDER — GABAPENTIN 400 MG PO CAPS: 400.0000 mg | ORAL_CAPSULE | Freq: Three times a day (TID) | ORAL | 3 refills | Status: DC

## 2024-01-19 NOTE — Telephone Encounter (Signed)
 Patient notes that's she has been on edge and nervous. She reports that she fears something bad will occur. Today Gabapentin  300 mg three times daily increased to 400 mg three times daily to help manage mood and anxiety. She will continue all other medications as prescribed.

## 2024-01-29 ENCOUNTER — Other Ambulatory Visit: Payer: Self-pay | Admitting: Internal Medicine

## 2024-01-30 ENCOUNTER — Telehealth (HOSPITAL_COMMUNITY): Payer: Self-pay

## 2024-01-30 NOTE — Telephone Encounter (Signed)
 Erroneous Encounter

## 2024-02-27 ENCOUNTER — Telehealth (HOSPITAL_COMMUNITY): Payer: Self-pay

## 2024-02-28 ENCOUNTER — Other Ambulatory Visit (HOSPITAL_COMMUNITY): Payer: Self-pay | Admitting: Psychiatry

## 2024-02-28 DIAGNOSIS — F3170 Bipolar disorder, currently in remission, most recent episode unspecified: Secondary | ICD-10-CM

## 2024-02-28 DIAGNOSIS — F411 Generalized anxiety disorder: Secondary | ICD-10-CM

## 2024-02-28 MED ORDER — QUETIAPINE FUMARATE 100 MG PO TABS: ORAL_TABLET | ORAL | 3 refills | Status: DC

## 2024-02-28 MED ORDER — GABAPENTIN 400 MG PO CAPS: 400.0000 mg | ORAL_CAPSULE | Freq: Three times a day (TID) | ORAL | 3 refills | Status: DC

## 2024-02-28 MED ORDER — QUETIAPINE FUMARATE 300 MG PO TABS: 300.0000 mg | ORAL_TABLET | Freq: Every day | ORAL | 3 refills | Status: DC

## 2024-02-28 MED ORDER — PRAZOSIN HCL 2 MG PO CAPS: 4.0000 mg | ORAL_CAPSULE | Freq: Every day | ORAL | 3 refills | Status: AC

## 2024-02-28 NOTE — Telephone Encounter (Signed)
 Medication sent to preferred pharmacy

## 2024-03-14 ENCOUNTER — Ambulatory Visit (HOSPITAL_COMMUNITY): Payer: MEDICAID

## 2024-03-14 ENCOUNTER — Other Ambulatory Visit: Payer: Self-pay | Admitting: Acute Care

## 2024-03-14 NOTE — Progress Notes (Unsigned)
 Tracheostomy Home Health Order Sheet  [facility]: [facility] Respiratory Care Department [address]  [place] [address]  [phone]  Fax [phone]  (Fax order requests to this number)   Dannilynn Gallina  08-May-1965  [id number]    Supplies:    1) Tracheostomy tube holder.  Quantity: maximum monthly amount.  Refills: monthly  (Indication: tracheostomy with frequent tube holder changes to avoid moisture related skin injury)  2)  Passy Muir speaking valve  Quaintly: 2  Refills: monthly   3) PMV secure-it Teather  Quantity: 2    Refills: monthly   Additional supplies    Medication instructions.      Maude Banner ACNP NPI# [id number] [facility]  [address] [place], [place] [address]   03/14/2024   @SIGNATUREIMG @    _______________________________________________.

## 2024-03-20 ENCOUNTER — Other Ambulatory Visit: Payer: MEDICAID

## 2024-03-27 ENCOUNTER — Telehealth: Payer: Self-pay | Admitting: Internal Medicine

## 2024-03-27 DIAGNOSIS — R198 Other specified symptoms and signs involving the digestive system and abdomen: Secondary | ICD-10-CM | POA: Insufficient documentation

## 2024-03-28 ENCOUNTER — Encounter (HOSPITAL_COMMUNITY): Payer: Self-pay | Admitting: Psychiatry

## 2024-03-28 ENCOUNTER — Telehealth (HOSPITAL_COMMUNITY): Payer: MEDICAID | Admitting: Psychiatry

## 2024-03-28 DIAGNOSIS — F3170 Bipolar disorder, currently in remission, most recent episode unspecified: Secondary | ICD-10-CM

## 2024-03-28 DIAGNOSIS — F411 Generalized anxiety disorder: Secondary | ICD-10-CM

## 2024-03-28 MED ORDER — QUETIAPINE FUMARATE 300 MG PO TABS: 300.0000 mg | ORAL_TABLET | Freq: Every day | ORAL | 3 refills | Status: AC

## 2024-03-28 MED ORDER — QUETIAPINE FUMARATE 100 MG PO TABS: ORAL_TABLET | ORAL | 3 refills | Status: AC

## 2024-03-28 MED ORDER — GABAPENTIN 400 MG PO CAPS: 400.0000 mg | ORAL_CAPSULE | Freq: Three times a day (TID) | ORAL | 3 refills | Status: AC

## 2024-03-28 NOTE — Telephone Encounter (Signed)
 Patient called and stated she was returning a call,  Notified patient of message below.   Asked patient if she received call from [facility] to schedule abdominal US , patient states that honestly she does not answer unsaved numbers as she gets calls all day from random numbers.  Patient did state that today she will be answering all calls to make sure she gets the US  scheduled.  Patient states she has been taking levothyroxine  to 175 mcg daily , patient also stated that she has not picked up Atorvastatin  but states that she shares medication with her husband , patient states if the medication that her husbands get might have a lower fee and she shares medications with him and she states she has been taking that as well.   Patient lab appointment was also rescheduled for  04/27/24 and ov on 05/03/24.  Patient states she will be getting a pill box from [facility] today.   Patient states she has not been scheduled for a mammogram and she does not know how can she get schedule for that.

## 2024-03-28 NOTE — Progress Notes (Signed)
 [facility] MD/PA/NP OP Progress Note   Virtual Visit via Video Note  I connected with Meghan Contreras on 03/28/24 at  1:00 PM EST by a video enabled telemedicine application and verified that I am speaking with the correct person using two identifiers.  Location: Patient: Home Provider: Clinic   I discussed the limitations of evaluation and management by telemedicine and the availability of in person appointments. The patient expressed understanding and agreed to proceed.  I provided 30 minutes of non-face-to-face time during this encounter.      03/28/2024 1:44 PM Meghan Contreras  MRN:  [id number]  Chief Complaint: I feel great  HPI: 59 year old female seen today for follow up psychiatric evaluation.  She has a psychiatric history of depression, PTSD, bipolar affective disorder, tobacco dependence, and opioid addiction (in remission five years).  Patient is currently prescribed gabapentin  300 mg 3 times daily, Seroquel  300 mg nightly, Seroquel  150 mg daily, and prazosin  4 mg nightly by her PCP. She notes that her medications are effective in managing her psychiatric conditions.   Today she was well-groomed, pleasant, cooperative, and engaged in conversation.  Patient notes that she has been doing great. She reports that she feels like a new person. Mentally patient reports that er anxiety and depression are well managed.  Today provider conducted a GAD-7 and patient scored a 4, at her last visit she scored a 13.  Provider also conducted PHQ-9 and patient scored a 5, at her last visit she scored a 9.  She endorses adequate sleep and appetite. Today she denies SI/HI/AVH, mania, paranoia.  Patient continues to be sober from tobacco for 16 months. She reports that she has more energy and is breathing better with the assistance of he trach.   No medication changes made today.  Patient agreeable to continue medications as prescribed. No other concerns noted at this time.     Visit Diagnosis:     ICD-10-CM   1. Generalized anxiety disorder  F41.1 QUEtiapine  (SEROQUEL ) 100 MG tablet    QUEtiapine  (SEROQUEL ) 300 MG tablet    gabapentin  (NEURONTIN ) 400 MG capsule    2. Bipolar affective disorder in remission  F31.70 QUEtiapine  (SEROQUEL ) 100 MG tablet    QUEtiapine  (SEROQUEL ) 300 MG tablet    gabapentin  (NEURONTIN ) 400 MG capsule           Past Psychiatric History: depression, PTSD, bipolar affective disorder, tobacco dependence, and opioid addiction (in remission four years).  Past Medical History:  Past Medical History:  Diagnosis Date   Anemia    Anxiety    Bipolar disorder (HCC)    COPD (chronic obstructive pulmonary disease) (HCC)    Depression    Elevated brain natriuretic peptide (BNP) level    Elevated serum creatinine    HOH (hard of hearing)    Hyperglycemia    Hyperlipidemia 2021   Hyperthyroidism    Left Ovarian torsion 01/06/2022   with large fibroma   Opiate addiction (HCC)    On chronic suboxone  since 2017 this last go around.     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  Old records from [facility] with CT of soft tissue of chest and neck 11/2017:  No findings to suggest cause of stridorous breathing and no obvious lung disease.     Tobacco dependence     Past Surgical History:  Procedure Laterality Date  DIRECT LARYNGOSCOPY N/A 11/28/2022   Procedure: DIRECT LARYNGOSCOPY;  Surgeon: Luciano Standing, MD;  Location: [facility] OR;  Service: ENT;  Laterality: N/A;   ROBOTIC ASSISTED BILATERAL SALPINGO OOPHERECTOMY N/A 01/08/2022   Procedure: XI ROBOTIC ASSISTED BILATERAL SALPINGO OOPHORECTOMY;  Surgeon: Viktoria Comer SAUNDERS, MD;  Location: [facility] ORS;  Service: Gynecology;  Laterality: N/A;   THYROIDECTOMY  2019   TRACHEOSTOMY TUBE PLACEMENT N/A 11/28/2022   Procedure: TRACHEOSTOMY;  Surgeon: Luciano Standing, MD;  Location: [facility] OR;  Service: ENT;   Laterality: N/A;    Family Psychiatric History: Substance use sister  Family History:  Family History  Problem Relation Age of Onset   HIV/AIDS Mother        from drug abuse   Drug abuse Mother    Drug abuse Brother    Hepatitis C Brother        From IV drug abuse   Drug abuse Sister    Drug abuse Sister    Drug abuse Sister    Drug abuse Sister    Drug abuse Sister    Cancer Sister        lung   Drug abuse Sister     Social History:  Social History   Socioeconomic History   Marital status: Legally Separated    Spouse name: Not on file   Number of children: 2   Years of education: Not on file   Highest education level: Some college, no degree  Occupational History   Occupation: unemployed    Comment: Supported by daughter, ex husband and boyfriend.  Tobacco Use   Smoking status: Every Day    Current packs/day: 0.25    Average packs/day: 0.3 packs/day for 42.0 years (10.5 ttl pk-yrs)    Types: Cigarettes    Passive exposure: Current   Smokeless tobacco: Former   Tobacco comments:    Quit a year aug 2024  Vaping Use   Vaping status: Never Used  Substance and Sexual Activity   Alcohol use: No   Drug use: Not Currently    Comment: Benzodiazepines / Klonopin/opiate addiction.  Also history of crack cocaine abuse.  2021 clean for 2017 on Suboxone .    Sexual activity: Not Currently  Other Topics Concern   Not on file  Social History Narrative    Lives alone, though her boyfriend will visit for 2 months at a time.   He lives with his daughter in [place] otherwise.   Social Drivers of Health   Tobacco Use: High Risk (01/17/2024)   Patient History    Smoking Tobacco Use: Every Day    Smokeless Tobacco Use: Former    Passive Exposure: Current  Physicist, Medical Strain: Medium Risk (02/11/2022)   Overall Financial Resource Strain (CARDIA)    Difficulty of Paying Living Expenses: Somewhat hard  Food Insecurity: Patient Unable To Answer (11/27/2022)   Hunger Vital  Sign    Worried About Running Out of Food in the Last Year: Patient unable to answer    Ran Out of Food in the Last Year: Patient unable to answer  Transportation Needs: No Transportation Needs (02/11/2022)   PRAPARE - Administrator, Civil Service (Medical): No    Lack of Transportation (Non-Medical): No  Physical Activity: Not on file  Stress: Not on file  Social Connections: Not on file  Depression (PHQ2-9): Medium Risk (03/28/2024)   Depression (PHQ2-9)    PHQ-2 Score: 5  Alcohol Screen: Not on file  Housing: Patient Unable To Answer (02/11/2022)  Housing    Last Housing Risk Score: 0  Utilities: Not At Risk (02/11/2022)   AHC Utilities    Threatened with loss of utilities: No  Health Literacy: Not on file    Allergies:  Allergies  Allergen Reactions   Amoxicillin Swelling and Rash   Latex Hives and Swelling   Penicillins Other (See Comments)    Facial swelling    Metabolic Disorder Labs: Lab Results  Component Value Date   HGBA1C 6.8 (H) 11/08/2023   MPG 119.76 12/01/2022   MPG 119.76 01/07/2022   No results found for: PROLACTIN Lab Results  Component Value Date   CHOL 262 (H) 11/08/2023   TRIG 523 (H) 11/08/2023   HDL 23 (L) 11/08/2023   LDLCALC 141 (H) 11/08/2023   LDLCALC 75 06/22/2022   Lab Results  Component Value Date   TSH 14.800 (H) 11/08/2023   TSH 5.690 (H) 06/22/2022    Therapeutic Level Labs: No results found for: LITHIUM No results found for: VALPROATE No results found for: CBMZ  Current Medications: Current Outpatient Medications  Medication Sig Dispense Refill   albuterol  (VENTOLIN  HFA) 108 (90 Base) MCG/ACT inhaler Inhale 2 puffs into the lungs every 6 (six) hours as needed for wheezing or shortness of breath. 18 g 4   atorvastatin  (LIPITOR) 40 MG tablet 1 tab by mouth daily with evening meal. 30 tablet 11   furosemide  (LASIX ) 40 MG tablet TAKE 1 TABLET BY MOUTH EVERY DAY 30 tablet 10   gabapentin  (NEURONTIN ) 400  MG capsule Take 1 capsule (400 mg total) by mouth 3 (three) times daily. 90 capsule 3   ibuprofen (ADVIL) 200 MG tablet Take 400-600 mg by mouth 2 (two) times daily as needed for moderate pain.     iron  polysaccharides (NIFEREX) 150 MG capsule Take 1 capsule (150 mg total) by mouth daily. (Patient not taking: Reported on 01/17/2024) 30 capsule 4   levothyroxine  (SYNTHROID ) 175 MCG tablet 1 tab by mouth 1/2 hour before breakfast and other meds daily 30 tablet 11   methadone  (DOLOPHINE ) 10 MG/ML solution Take 55 mg by mouth daily. Verified with RN Monta Rase 54 mg     polyethylene glycol (MIRALAX  / GLYCOLAX ) 17 g packet Take 17 g by mouth daily as needed for mild constipation. (Patient not taking: Reported on 01/17/2024) 14 each 0   prazosin  (MINIPRESS ) 2 MG capsule Take 2 capsules (4 mg total) by mouth at bedtime. 60 capsule 3   QUEtiapine  (SEROQUEL ) 100 MG tablet 1 1/2 tabs in the morning 45 tablet 3   QUEtiapine  (SEROQUEL ) 300 MG tablet Take 1 tablet (300 mg total) by mouth at bedtime. 30 tablet 3   TRELEGY ELLIPTA  100-62.5-25 MCG/ACT AEPB TAKE 1 PUFF BY MOUTH EVERY DAY 60 each 11   No current facility-administered medications for this visit.     Musculoskeletal: Strength & Muscle Tone: within normal limits Gait & Station: normal Patient leans: N/A  Psychiatric Specialty Exam: Review of Systems  There were no vitals taken for this visit.There is no height or weight on file to calculate BMI.  General Appearance: Well Groomed  Eye Contact:  Good  Speech:  Clear and Coherent and Normal Rate  Volume:  Normal  Mood:  Euthymic  Affect:  Appropriate and Congruent  Thought Process:  Coherent, Goal Directed, and Linear  Orientation:  Full (Time, Place, and Person)  Thought Content: WDL and Logical   Suicidal Thoughts:  No  Homicidal Thoughts:  No  Memory:  Immediate;  Good Recent;   Good Remote;   Good  Judgement:  Good  Insight:  Good  Psychomotor Activity:  Normal  Concentration:   Concentration: Good and Attention Span: Good  Recall:  Good  Fund of Knowledge: Good  Language: Good  Akathisia:  No  Handed:  Right  AIMS (if indicated): not done  Assets:  Communication Skills Desire for Improvement Financial Resources/Insurance Housing Intimacy Leisure Time Social Support  ADL's:  Intact  Cognition: WNL  Sleep:  Good   Screenings: GAD-7    Flowsheet Row Video Visit from 03/28/2024 in [facility] Video Visit from 01/12/2024 in [facility] Video Visit from 08/03/2023 in [facility] Video Visit from 06/01/2023 in [facility] Clinical Support from 03/21/2023 in [facility]  Total GAD-7 Score 4 13 5 4 9    PHQ2-9    Flowsheet Row Video Visit from 03/28/2024 in [facility] Video Visit from 01/12/2024 in [facility] Video Visit from 08/03/2023 in [facility] Video Visit from 06/01/2023 in [facility] Clinical Support from 03/21/2023 in [facility]  PHQ-2 Total Score 2 2 0 0 0  PHQ-9 Total Score 5 9 7 3 5    Flowsheet Row ED to Hosp-Admission (Discharged) from 11/27/2022 in [facility] 3E HF PCU ED from 06/07/2022 in [facility] Emergency Department at [facility] ED from 03/29/2022 in [facility] Emergency Department at [facility]  C-SSRS RISK CATEGORY No Risk No Risk No Risk     Assessment and Plan: Patient reports that her anxiety, depression, and mood are well managed. No medication changes made today.  Patient agreeable to continue medications as prescribed.   1. Generalized anxiety disorder  Continue- QUEtiapine  (SEROQUEL ) 300 MG tablet; Take 1 tablet (300 mg total) by mouth at bedtime.  Dispense: 30 tablet; Refill: 3 Continue- QUEtiapine  (SEROQUEL ) 100 MG tablet; 1  1/2 tabs in the morning  Dispense: 45 tablet; Refill: 3 Continue- gabapentin  (NEURONTIN ) 300 MG capsule; Take 1 capsule (300 mg total) by mouth 3 (three) times daily.  Dispense: 90 capsule; Refill: 3  2. 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) (Primary)  Continue- QUEtiapine  (SEROQUEL ) 300 MG tablet; Take 1 tablet (300 mg total) by mouth at bedtime.  Dispense: 30 tablet; Refill: 3 Continue- QUEtiapine  (SEROQUEL ) 100 MG tablet; 1 1/2 tabs in the morning  Dispense: 45 tablet; Refill: 3 Continue- gabapentin  (NEURONTIN ) 300 MG capsule; Take 1 capsule (300 mg total) by mouth 3 (three) times daily.  Dispense: 90 capsule; Refill: 3   Follow-up in 2.5 months Follow-up with therapy  Zane FORBES Bach, NP 03/28/2024, 1:44 PM

## 2024-03-29 ENCOUNTER — Ambulatory Visit: Payer: MEDICAID | Admitting: Internal Medicine

## 2024-03-29 ENCOUNTER — Other Ambulatory Visit: Payer: Self-pay | Admitting: Acute Care

## 2024-03-29 ENCOUNTER — Ambulatory Visit (HOSPITAL_COMMUNITY)
Admission: RE | Admit: 2024-03-29 | Discharge: 2024-03-29 | Disposition: A | Discharge status: Home / Self Care | Payer: MEDICAID | Source: Ambulatory Visit | Attending: Acute Care | Admitting: Acute Care

## 2024-03-29 NOTE — Progress Notes (Incomplete)
 Tracheostomy Procedure Note  Annaliah Rivenbark [id number] 12-29-1965  Pre Procedure Tracheostomy Information  Trach Brand: Shiley Size: 4.0 [id number] Style: Uncuffed Secured by: Velcro   Procedure: Trach Change and Trach Cleaning    Post Procedure Tracheostomy Information  Trach Brand: Shiley Size: 4.0 [id number] Style: Uncuffed Secured by: Velcro   Post Procedure Evaluation:  ETCO2 positive color change from yellow to purple : Yes.   Vital signs:VSS Patients current condition: stable Complications: No apparent complications Trach site exam: clean, dry Wound care done: 4 x 4 gauze drain Patient did tolerate procedure well.   Education: none  Prescription needs: none    Additional needs: New PMV given at this visit. One extra uncufed #4.0  and one extra 40 Cuffed trach given to patient to take home.

## 2024-03-29 NOTE — Progress Notes (Signed)
 Tracheostomy Home Health Order Sheet  [facility]: [facility] Respiratory Care Department [address]  [place] [address]  [phone]  Fax [phone]  (Fax order requests to this number)   Meghan Contreras  1965/04/19  [id number]   Please order the following    # 2 Shiley , size 4, and uncuffed [id number]   #4 replaceable inner cannulas/ # 60/ refill 12       Supplies:     tube holder/collar. Quantity: 8/month refills12, 4X4 gauze drain dressings. Quantity: 30 refills12, HME device. Quantity30 refills12, and Passy Muir Valve (speaking valve) Quantity1/mo refills12 Also add PMV Secure-it Tether #2/mo/ refill 12  Suction catheters (size: 8, 10,12,14)  Additional supplies    Medication instructions.      Maude Banner ACNP NPI# [id number] [facility]  [address] [place], [place] [address]   03/29/24   @SIGNATUREIMG @    _______________________________________________.

## 2024-04-27 ENCOUNTER — Other Ambulatory Visit: Payer: Self-pay

## 2024-05-03 ENCOUNTER — Ambulatory Visit: Payer: Self-pay | Admitting: Internal Medicine

## 2024-05-07 ENCOUNTER — Encounter: Payer: MEDICAID | Admitting: Internal Medicine

## 2024-05-30 ENCOUNTER — Encounter: Payer: MEDICAID | Admitting: Internal Medicine

## 2024-06-06 ENCOUNTER — Telehealth (HOSPITAL_COMMUNITY): Payer: MEDICAID | Admitting: Psychiatry
